# Patient Record
Sex: Female | Born: 1960 | Race: Black or African American | Hispanic: No | Marital: Single | Smoking: Former smoker
Health system: Southern US, Community
[De-identification: ages and names within clinical notes are randomized; demographics above are authoritative.]

## PROBLEM LIST (undated history)

## (undated) DIAGNOSIS — I10 Essential (primary) hypertension: Secondary | ICD-10-CM

## (undated) DIAGNOSIS — J449 Chronic obstructive pulmonary disease, unspecified: Secondary | ICD-10-CM

## (undated) DIAGNOSIS — I509 Heart failure, unspecified: Secondary | ICD-10-CM

## (undated) DIAGNOSIS — C801 Malignant (primary) neoplasm, unspecified: Secondary | ICD-10-CM

## (undated) DIAGNOSIS — J189 Pneumonia, unspecified organism: Secondary | ICD-10-CM

---

## 1990-04-18 HISTORY — PX: TUBAL LIGATION: SHX77

## 2016-12-22 DIAGNOSIS — M545 Low back pain: Secondary | ICD-10-CM | POA: Diagnosis not present

## 2016-12-22 DIAGNOSIS — G629 Polyneuropathy, unspecified: Secondary | ICD-10-CM | POA: Diagnosis not present

## 2016-12-22 DIAGNOSIS — I10 Essential (primary) hypertension: Secondary | ICD-10-CM | POA: Diagnosis not present

## 2016-12-22 DIAGNOSIS — M2550 Pain in unspecified joint: Secondary | ICD-10-CM | POA: Diagnosis not present

## 2016-12-27 DIAGNOSIS — M545 Low back pain: Secondary | ICD-10-CM | POA: Diagnosis not present

## 2016-12-27 DIAGNOSIS — M1711 Unilateral primary osteoarthritis, right knee: Secondary | ICD-10-CM | POA: Diagnosis not present

## 2016-12-27 DIAGNOSIS — M1712 Unilateral primary osteoarthritis, left knee: Secondary | ICD-10-CM | POA: Diagnosis not present

## 2016-12-27 DIAGNOSIS — M25561 Pain in right knee: Secondary | ICD-10-CM | POA: Diagnosis not present

## 2016-12-27 DIAGNOSIS — M25562 Pain in left knee: Secondary | ICD-10-CM | POA: Diagnosis not present

## 2017-01-04 DIAGNOSIS — M25569 Pain in unspecified knee: Secondary | ICD-10-CM | POA: Diagnosis not present

## 2017-01-04 DIAGNOSIS — M545 Low back pain: Secondary | ICD-10-CM | POA: Diagnosis not present

## 2017-01-06 DIAGNOSIS — M179 Osteoarthritis of knee, unspecified: Secondary | ICD-10-CM | POA: Diagnosis not present

## 2017-01-06 DIAGNOSIS — M2550 Pain in unspecified joint: Secondary | ICD-10-CM | POA: Diagnosis not present

## 2017-01-06 DIAGNOSIS — M545 Low back pain: Secondary | ICD-10-CM | POA: Diagnosis not present

## 2017-01-06 DIAGNOSIS — I10 Essential (primary) hypertension: Secondary | ICD-10-CM | POA: Diagnosis not present

## 2017-01-13 DIAGNOSIS — M170 Bilateral primary osteoarthritis of knee: Secondary | ICD-10-CM | POA: Diagnosis not present

## 2018-04-15 ENCOUNTER — Inpatient Hospital Stay (HOSPITAL_COMMUNITY)
Admission: EM | Admit: 2018-04-15 | Discharge: 2018-04-19 | DRG: 194 | Disposition: A | Discharge status: Home / Self Care | Payer: BLUE CROSS/BLUE SHIELD | Attending: Family Medicine | Admitting: Family Medicine

## 2018-04-15 ENCOUNTER — Emergency Department (HOSPITAL_COMMUNITY): Payer: BLUE CROSS/BLUE SHIELD

## 2018-04-15 ENCOUNTER — Encounter (HOSPITAL_COMMUNITY): Payer: Self-pay

## 2018-04-15 ENCOUNTER — Other Ambulatory Visit: Payer: Self-pay

## 2018-04-15 DIAGNOSIS — Z79899 Other long term (current) drug therapy: Secondary | ICD-10-CM | POA: Diagnosis not present

## 2018-04-15 DIAGNOSIS — I110 Hypertensive heart disease with heart failure: Secondary | ICD-10-CM | POA: Diagnosis not present

## 2018-04-15 DIAGNOSIS — Z833 Family history of diabetes mellitus: Secondary | ICD-10-CM | POA: Diagnosis not present

## 2018-04-15 DIAGNOSIS — J189 Pneumonia, unspecified organism: Secondary | ICD-10-CM | POA: Diagnosis present

## 2018-04-15 DIAGNOSIS — J440 Chronic obstructive pulmonary disease with acute lower respiratory infection: Secondary | ICD-10-CM | POA: Diagnosis present

## 2018-04-15 DIAGNOSIS — J9611 Chronic respiratory failure with hypoxia: Secondary | ICD-10-CM

## 2018-04-15 DIAGNOSIS — I5032 Chronic diastolic (congestive) heart failure: Secondary | ICD-10-CM | POA: Diagnosis present

## 2018-04-15 DIAGNOSIS — R05 Cough: Secondary | ICD-10-CM | POA: Diagnosis not present

## 2018-04-15 DIAGNOSIS — J209 Acute bronchitis, unspecified: Secondary | ICD-10-CM | POA: Diagnosis present

## 2018-04-15 DIAGNOSIS — R32 Unspecified urinary incontinence: Secondary | ICD-10-CM | POA: Diagnosis present

## 2018-04-15 DIAGNOSIS — J180 Bronchopneumonia, unspecified organism: Principal | ICD-10-CM | POA: Diagnosis present

## 2018-04-15 DIAGNOSIS — R0902 Hypoxemia: Secondary | ICD-10-CM | POA: Diagnosis present

## 2018-04-15 DIAGNOSIS — E860 Dehydration: Secondary | ICD-10-CM | POA: Diagnosis present

## 2018-04-15 DIAGNOSIS — Z809 Family history of malignant neoplasm, unspecified: Secondary | ICD-10-CM | POA: Diagnosis not present

## 2018-04-15 DIAGNOSIS — R0602 Shortness of breath: Secondary | ICD-10-CM | POA: Diagnosis not present

## 2018-04-15 DIAGNOSIS — Z87891 Personal history of nicotine dependence: Secondary | ICD-10-CM | POA: Diagnosis not present

## 2018-04-15 DIAGNOSIS — J441 Chronic obstructive pulmonary disease with (acute) exacerbation: Secondary | ICD-10-CM | POA: Diagnosis present

## 2018-04-15 DIAGNOSIS — R350 Frequency of micturition: Secondary | ICD-10-CM | POA: Diagnosis not present

## 2018-04-15 HISTORY — DX: Essential (primary) hypertension: I10

## 2018-04-15 HISTORY — DX: Heart failure, unspecified: I50.9

## 2018-04-15 HISTORY — DX: Pneumonia, unspecified organism: J18.9

## 2018-04-15 LAB — BRAIN NATRIURETIC PEPTIDE: B NATRIURETIC PEPTIDE 5: 29.6 pg/mL (ref 0.0–100.0)

## 2018-04-15 LAB — CBC WITH DIFFERENTIAL/PLATELET
Abs Immature Granulocytes: 0.07 10*3/uL (ref 0.00–0.07)
Basophils Absolute: 0 10*3/uL (ref 0.0–0.1)
Basophils Relative: 0 %
Eosinophils Absolute: 0.2 10*3/uL (ref 0.0–0.5)
Eosinophils Relative: 3 %
HCT: 48.8 % — ABNORMAL HIGH (ref 36.0–46.0)
Hemoglobin: 15.3 g/dL — ABNORMAL HIGH (ref 12.0–15.0)
Immature Granulocytes: 1 %
Lymphocytes Relative: 24 %
Lymphs Abs: 1.9 10*3/uL (ref 0.7–4.0)
MCH: 29.4 pg (ref 26.0–34.0)
MCHC: 31.4 g/dL (ref 30.0–36.0)
MCV: 93.7 fL (ref 80.0–100.0)
Monocytes Absolute: 0.6 10*3/uL (ref 0.1–1.0)
Monocytes Relative: 8 %
Neutro Abs: 5.2 10*3/uL (ref 1.7–7.7)
Neutrophils Relative %: 64 %
Platelets: 367 10*3/uL (ref 150–400)
RBC: 5.21 MIL/uL — ABNORMAL HIGH (ref 3.87–5.11)
RDW: 13.5 % (ref 11.5–15.5)
WBC: 8.1 10*3/uL (ref 4.0–10.5)
nRBC: 0.2 % (ref 0.0–0.2)

## 2018-04-15 LAB — BASIC METABOLIC PANEL
Anion gap: 10 (ref 5–15)
BUN: 9 mg/dL (ref 6–20)
CO2: 31 mmol/L (ref 22–32)
Calcium: 9.2 mg/dL (ref 8.9–10.3)
Chloride: 101 mmol/L (ref 98–111)
Creatinine, Ser: 1.06 mg/dL — ABNORMAL HIGH (ref 0.44–1.00)
GFR calc Af Amer: >60 mL/min (ref >60)
GFR calc non Af Amer: 58 mL/min — ABNORMAL LOW (ref >60)
Glucose, Bld: 114 mg/dL — ABNORMAL HIGH (ref 70–99)
POTASSIUM: 4.1 mmol/L (ref 3.5–5.1)
SODIUM: 142 mmol/L (ref 135–145)

## 2018-04-15 LAB — URINALYSIS, ROUTINE W REFLEX MICROSCOPIC
Glucose, UA: NEGATIVE mg/dL
Hgb urine dipstick: NEGATIVE
Ketones, ur: NEGATIVE mg/dL
Nitrite: NEGATIVE
Protein, ur: 100 mg/dL — AB
Specific Gravity, Urine: 1.023 (ref 1.005–1.030)
pH: 5 (ref 5.0–8.0)

## 2018-04-15 LAB — I-STAT TROPONIN, ED: Troponin i, poc: 0.01 ng/mL (ref 0.00–0.08)

## 2018-04-15 LAB — D-DIMER, QUANTITATIVE: D-Dimer, Quant: 0.68 ug/mL-FEU — ABNORMAL HIGH (ref 0.00–0.50)

## 2018-04-15 MED ORDER — SODIUM CHLORIDE 0.9 % IV SOLN
500.0000 mg | Freq: Once | INTRAVENOUS | Status: AC
  Administered 2018-04-15: 500 mg via INTRAVENOUS
  Filled 2018-04-15: qty 500

## 2018-04-15 MED ORDER — IOPAMIDOL (ISOVUE-370) INJECTION 76%
INTRAVENOUS | Status: AC
  Filled 2018-04-15: qty 100

## 2018-04-15 MED ORDER — ENOXAPARIN SODIUM 40 MG/0.4ML ~~LOC~~ SOLN
40.0000 mg | SUBCUTANEOUS | Status: DC
  Administered 2018-04-16 – 2018-04-18 (×3): 40 mg via SUBCUTANEOUS
  Filled 2018-04-15 (×3): qty 0.4

## 2018-04-15 MED ORDER — IOPAMIDOL (ISOVUE-370) INJECTION 76%
100.0000 mL | Freq: Once | INTRAVENOUS | Status: AC | PRN
  Administered 2018-04-15: 100 mL via INTRAVENOUS

## 2018-04-15 MED ORDER — SODIUM CHLORIDE 0.9 % IV SOLN
INTRAVENOUS | Status: AC
  Administered 2018-04-15: 21:00:00 via INTRAVENOUS

## 2018-04-15 MED ORDER — SODIUM CHLORIDE 0.9 % IV SOLN
1.0000 g | Freq: Once | INTRAVENOUS | Status: AC
  Administered 2018-04-15: 1 g via INTRAVENOUS
  Filled 2018-04-15: qty 10

## 2018-04-15 NOTE — ED Notes (Signed)
Admitting doctor at  The bedside 

## 2018-04-15 NOTE — ED Notes (Signed)
Report attempted

## 2018-04-15 NOTE — ED Notes (Signed)
Patient transported to CT

## 2018-04-15 NOTE — ED Provider Notes (Signed)
[facility] EMERGENCY DEPARTMENT Provider Note   CSN: [id number] Arrival date & time: 04/15/18  1135     History   Chief Complaint Chief Complaint  Patient presents with   Cough   Urinary Frequency   Fatigue    HPI Maleeah Crossman is a 57 y.o. female with a past medical history of hypertension who presents to ED for multiple complaints.  Her first complaint is 40-month history of frequent urination.  Her next complaint is cough for 1 week.  States that it is a dry cough and sometimes she will have increased urinary pressure and incontinence when she coughs.  She has tried Robitussin other over-the-counter medications with no improvement in her cough.  Denies any dysuria, abdominal pain, changes to bowel movements or fever, chest pain. Her next complaint is fatigue.  Daughter at bedside states that patient will "sleep a lot, all the time."  She does endorse shortness of breath worse with exertion.  Denies any recent immobilization, leg swelling or history of PE, DVT or CHF.  Denies any specific weakness, injuries or falls, headache or vision changes.  HPI  Past Medical History:  Diagnosis Date   Hypertension     There are no active problems to display for this patient.   Past Surgical History:  Procedure Laterality Date   CESAREAN SECTION       OB History   No obstetric history on file.      Home Medications    Prior to Admission medications   Medication Sig Start Date End Date Taking? Authorizing Provider  guaiFENesin (ROBITUSSIN) 100 MG/5ML SOLN Take 15 mLs by mouth every 4 (four) hours as needed for cough or to loosen phlegm.   Yes [provider]  sodium-potassium bicarbonate (ALKA-SELTZER GOLD) TBEF dissolvable tablet Take 1 tablet by mouth daily as needed (cold symptoms).   Yes [provider]    Family History History reviewed. No pertinent family history.  Social History Social History   Tobacco Use   Smoking  status: Never Smoker   Smokeless tobacco: Never Used  Substance Use Topics   Alcohol use: Never    Frequency: Never   Drug use: Never     Allergies   Patient has no known allergies.   Review of Systems Review of Systems  Constitutional: Positive for fatigue. Negative for appetite change, chills and fever.  HENT: Negative for ear pain, rhinorrhea, sneezing and sore throat.   Eyes: Negative for photophobia and visual disturbance.  Respiratory: Positive for cough and shortness of breath. Negative for chest tightness and wheezing.   Cardiovascular: Negative for chest pain and palpitations.  Gastrointestinal: Negative for abdominal pain, blood in stool, constipation, diarrhea, nausea and vomiting.  Genitourinary: Positive for frequency. Negative for dysuria, hematuria and urgency.  Musculoskeletal: Negative for myalgias.  Skin: Negative for rash.  Neurological: Negative for dizziness, weakness and light-headedness.     Physical Exam Updated Vital Signs BP 122/71    Pulse 90    Temp 99 F (37.2 C) (Oral)    Resp (!) 24    SpO2 90%   Physical Exam Vitals signs and nursing note reviewed.  Constitutional:      General: She is not in acute distress.    Appearance: She is well-developed.  HENT:     Head: Normocephalic and atraumatic.     Nose: Nose normal.  Eyes:     General: No scleral icterus.       Right eye: No discharge.  Left eye: No discharge.     Conjunctiva/sclera: Conjunctivae normal.  Neck:     Musculoskeletal: Normal range of motion and neck supple.  Cardiovascular:     Rate and Rhythm: Normal rate and regular rhythm.     Heart sounds: Normal heart sounds. No murmur. No friction rub. No gallop.   Pulmonary:     Effort: Pulmonary effort is normal. No respiratory distress.     Breath sounds: Normal breath sounds.  Abdominal:     General: Bowel sounds are normal. There is no distension.     Palpations: Abdomen is soft.     Tenderness: There is no  abdominal tenderness. There is no guarding.  Musculoskeletal: Normal range of motion.     Comments: No midline spinal tenderness present in lumbar, thoracic or cervical spine. No step-off palpated. No visible bruising, edema or temperature change noted. No objective signs of numbness present. No saddle anesthesia. 2+ DP pulses bilaterally. Sensation intact to light touch. Strength 5/5 in bilateral lower extremities. No calf tenderness bilaterally.  Skin:    General: Skin is warm and dry.     Findings: No rash.  Neurological:     Mental Status: She is alert.     Motor: No abnormal muscle tone.     Coordination: Coordination normal.      ED Treatments / Results  Labs (all labs ordered are listed, but only abnormal results are displayed) Labs Reviewed  URINALYSIS, ROUTINE W REFLEX MICROSCOPIC - Abnormal; Notable for the following components:      Result Value   Color, Urine AMBER (*)    APPearance CLOUDY (*)    Bilirubin Urine SMALL (*)    Protein, ur 100 (*)    Leukocytes, UA SMALL (*)    Bacteria, UA RARE (*)    All other components within normal limits  BASIC METABOLIC PANEL - Abnormal; Notable for the following components:   Glucose, Bld 114 (*)    Creatinine, Ser 1.06 (*)    GFR calc non Af Amer 58 (*)    All other components within normal limits  CBC WITH DIFFERENTIAL/PLATELET - Abnormal; Notable for the following components:   RBC 5.21 (*)    Hemoglobin 15.3 (*)    HCT 48.8 (*)    All other components within normal limits  D-DIMER, QUANTITATIVE (NOT AT [facility]) - Abnormal; Notable for the following components:   D-Dimer, Quant 0.68 (*)    All other components within normal limits  BRAIN NATRIURETIC PEPTIDE  I-STAT TROPONIN, ED    EKG EKG Interpretation  Date/Time:  Sunday April 15 2018 13:53:53 EST Ventricular Rate:  87 PR Interval:  138 QRS Duration: 84 QT Interval:  398 QTC Calculation: 478 R Axis:   67 Text Interpretation:  Normal sinus rhythm Normal ECG  No previous tracing Confirmed by Blanchie Dessert [phone]) on 04/15/2018 1:57:11 PM   Radiology Dg Chest 2 View  Result Date: 04/15/2018 CLINICAL DATA:  Cough for 1 week.  Frequent urination. EXAM: CHEST - 2 VIEW COMPARISON:  None. FINDINGS: Vague densities in the left lower chest which could be related to overlying soft tissues. Upper lungs are clear. Heart size is within normal limits. No large pleural effusions. Bone structures are unremarkable. IMPRESSION: Subtle densities in the left lower chest are nonspecific. Findings could be related overlying soft tissues versus atelectasis. Limited evaluation of the lungs due to body habitus. Electronically Signed   By: Markus Daft M.D.   On: 04/15/2018 14:16    Procedures  Procedures (including critical care time)  Medications Ordered in ED Medications  iopamidol (ISOVUE-370) 76 % injection (has no administration in time range)  iopamidol (ISOVUE-370) 76 % injection 100 mL (100 mLs Intravenous Contrast Given 04/15/18 1531)     Initial Impression / Assessment and Plan / ED Course  I have reviewed the triage vital signs and the nursing notes.  Pertinent labs & imaging results that were available during my care of the patient were reviewed by me and considered in my medical decision making (see chart for details).     57 year old female with a past medical history of hypertension presents to ED for 39-month history of frequent urination, shortness of breath, fatigue and 1 week history of cough.  Daughter at bedside states that patient has low energy and "sleeping a lot."  She reports shortness of breath with exertion.  Denies any chest pain, injuries or falls, wheezing or hemoptysis.  Patient initially 92% on room air.  She then dropped to 80s on room air.  She is afebrile.  No leg edema, erythema or calf tenderness that would concern me for DVT.  CBC, BMP unremarkable.  Urinalysis is negative for bacteria.  Chest x-ray shows nonspecific densities in  left lower chest.  EKG shows normal sinus rhythm.  Troponin, d-dimer and BNP pending.  Patient placed on 3 L of oxygen by nasal cannula.  Troponin is negative.  D-dimer elevated.  Will need CTA of the chest to rule out PE. Care handed off to oncoming provider.   Final Clinical Impressions(s) / ED Diagnoses   Final diagnoses:  Hypoxia    ED Discharge Orders    None       Delia Heady, PA-C 04/15/18 1546    Blanchie Dessert, MD 04/15/18 1727

## 2018-04-15 NOTE — ED Notes (Signed)
Pt returned from  c-t  sats good she remains on 02  No discomfort

## 2018-04-15 NOTE — ED Notes (Signed)
Report attempted  unsuccessdul  They will call me

## 2018-04-15 NOTE — ED Triage Notes (Signed)
Pt c/o frequent urination, dry cough and fatigue x6 months.

## 2018-04-15 NOTE — Progress Notes (Signed)
Patient arrived to floor. Alert and oriented x4. Daughter at bedside.

## 2018-04-15 NOTE — H&P (Signed)
History and Physical    Tiffany Owen JJO:[id number] DOB: 05-31-1960 DOA: 04/15/2018  PCP: System, Pcp Not In  Patient cannot remember name, visiting from [facility] Patient coming from: daughter's home  Chief Complaint: SOB  HPI: Tiffany Owen is a 57 y.o. female with medical history significant of HTN who presents for 6 months of SOB and weight gain.  This was attributed to recently quitting smoking and being more sedentary.  However, over the last week, she has gotten much more short of breath, now with DOE, chills and a cough.  She has had a sore abdomen from coughing.  She has two sick grandchildren.  She denies productive cough, fever, chest pain, mylagia, orthopnea, pleuritic pain, diarrhea, nausea, vomiting, decreased PO intake, recent travel out of state.   ED Course: In the ED, she was found to have an elevated D dimer and a CT scan was done which showed bilateral multilobar bronchopneumonia. Labs further showed an increased H/H and Cr of 1.06.  We don't have old to review (most recent in care everywhere are from 2017. TnI was 0.01.   Review of Systems: As per HPI otherwise 10 point review of systems negative.    Past Medical History:  Diagnosis Date   Hypertension     Past Surgical History:  Procedure Laterality Date   CESAREAN SECTION       reports that she has quit smoking. She has never used smokeless tobacco. She reports that she does not drink alcohol or use drugs.  No Known Allergies  Family History  Problem Relation Age of Onset   Cancer Mother    Diabetes Mellitus II Father    Cancer Father    Cancer Brother      Prior to Admission medications   Medication Sig Start Date End Date Taking? Authorizing Provider  guaiFENesin (ROBITUSSIN) 100 MG/5ML SOLN Take 15 mLs by mouth every 4 (four) hours as needed for cough or to loosen phlegm.   Yes [provider]  sodium-potassium bicarbonate (ALKA-SELTZER GOLD) TBEF dissolvable tablet Take 1 tablet by  mouth daily as needed (cold symptoms).   Yes [provider]    Physical Exam:  Constitutional: Obese woman, comfortable in chair Vitals:   04/15/18 1610 04/15/18 1637 04/15/18 1706 04/15/18 1756  BP:  125/68 130/75 124/64  Pulse:  94 (!) 101 84  Resp:  16 20 20   Temp:   98.3 F (36.8 C)   TempSrc:      SpO2: (!) 87% 95% 94% 93%   Eyes:  lids and conjunctivae normal ENMT: Mucous membranes are dry. Normal dentition.  Neck: normal, supple Respiratory: Course breath sounds in bases, worse on the left.  She has a large body habitus, so breath sounds are distant Cardiovascular: RR, NR, no apparent murmur Abdomen: diffusely tender, obese, +BS  Musculoskeletal: Good muscle tone, no edema, warm and well perfused.  Skin: no rashes, lesions, ulcers. No induration Neurologic: Grossly intact, no sensory deficit to light touch.  Psychiatric: Normal judgment and insight. Alert and oriented x 3. Normal mood.   Labs on Admission: I have personally reviewed following labs and imaging studies  CBC: Recent Labs  Lab 04/15/18 1229  WBC 8.1  NEUTROABS 5.2  HGB 15.3*  HCT 48.8*  MCV 93.7  PLT 160   Basic Metabolic Panel: Recent Labs  Lab 04/15/18 1229  NA 142  K 4.1  CL 101  CO2 31  GLUCOSE 114*  BUN 9  CREATININE 1.06*  CALCIUM 9.2  GFR: CrCl cannot be calculated (Unknown ideal weight.). Liver Function Tests: No results for input(s): AST, ALT, ALKPHOS, BILITOT, PROT, ALBUMIN in the last 168 hours. No results for input(s): LIPASE, AMYLASE in the last 168 hours. No results for input(s): AMMONIA in the last 168 hours. Coagulation Profile: No results for input(s): INR, PROTIME in the last 168 hours. Cardiac Enzymes: No results for input(s): CKTOTAL, CKMB, CKMBINDEX, TROPONINI in the last 168 hours. BNP (last 3 results) No results for input(s): PROBNP in the last 8760 hours. HbA1C: No results for input(s): HGBA1C in the last 72 hours. CBG: No results for input(s):  GLUCAP in the last 168 hours. Lipid Profile: No results for input(s): CHOL, HDL, LDLCALC, TRIG, CHOLHDL, LDLDIRECT in the last 72 hours. Thyroid Function Tests: No results for input(s): TSH, T4TOTAL, FREET4, T3FREE, THYROIDAB in the last 72 hours. Anemia Panel: No results for input(s): VITAMINB12, FOLATE, FERRITIN, TIBC, IRON, RETICCTPCT in the last 72 hours. Urine analysis:    Component Value Date/Time   COLORURINE AMBER (A) 04/15/2018 1241   APPEARANCEUR CLOUDY (A) 04/15/2018 1241   LABSPEC 1.023 04/15/2018 1241   PHURINE 5.0 04/15/2018 1241   GLUCOSEU NEGATIVE 04/15/2018 1241   HGBUR NEGATIVE 04/15/2018 1241   BILIRUBINUR SMALL (A) 04/15/2018 1241   KETONESUR NEGATIVE 04/15/2018 1241   PROTEINUR 100 (A) 04/15/2018 1241   NITRITE NEGATIVE 04/15/2018 1241   LEUKOCYTESUR SMALL (A) 04/15/2018 1241    Radiological Exams on Admission: Dg Chest 2 View  Result Date: 04/15/2018 CLINICAL DATA:  Cough for 1 week.  Frequent urination. EXAM: CHEST - 2 VIEW COMPARISON:  None. FINDINGS: Vague densities in the left lower chest which could be related to overlying soft tissues. Upper lungs are clear. Heart size is within normal limits. No large pleural effusions. Bone structures are unremarkable. IMPRESSION: Subtle densities in the left lower chest are nonspecific. Findings could be related overlying soft tissues versus atelectasis. Limited evaluation of the lungs due to body habitus. Electronically Signed   By: Markus Daft M.D.   On: 04/15/2018 14:16   Ct Angio Chest Pe W/cm &/or Wo Cm  Result Date: 04/15/2018 CLINICAL DATA:  57 year old female with history of cough and shortness of breath for the past 2 weeks. No associated chest pain. EXAM: CT ANGIOGRAPHY CHEST WITH CONTRAST TECHNIQUE: Multidetector CT imaging of the chest was performed using the standard protocol during bolus administration of intravenous contrast. Multiplanar CT image reconstructions and MIPs were obtained to evaluate the  vascular anatomy. CONTRAST:  <See Chart> ISOVUE-370 IOPAMIDOL (ISOVUE-370) INJECTION 76% COMPARISON:  No priors. FINDINGS: Cardiovascular: No filling defects within the pulmonary arterial tree to suggest underlying pulmonary embolism. Heart size is mildly enlarged. There is no significant pericardial fluid, thickening or pericardial calcification. Aortic atherosclerosis. No definite coronary artery calcifications. Mediastinum/Nodes: No pathologically enlarged mediastinal or hilar lymph nodes. Esophagus is unremarkable in appearance. No axillary lymphadenopathy. Lungs/Pleura: Patchy multifocal interstitial and airspace disease in the lungs bilaterally, most confluent in the left lower lobe where there is extensive peribronchovascular ground-glass attenuation and areas of consolidation, most compatible with multilobar bronchopneumonia. Area of scarring in the inferior segment of the lingula. No pleural effusions. Upper Abdomen: Diffuse low attenuation throughout the visualized hepatic parenchyma, indicative of severe hepatic steatosis. Musculoskeletal: There are no aggressive appearing lytic or blastic lesions noted in the visualized portions of the skeleton. Review of the MIP images confirms the above findings. IMPRESSION: 1. No evidence of pulmonary embolism. 2. Multilobar bronchopneumonia, most severe in the left lower lobe. 3.  Mild cardiomegaly. 4. Hepatic steatosis. 5. Aortic atherosclerosis. Aortic Atherosclerosis (ICD10-I70.0). Electronically Signed   By: Vinnie Langton M.D.   On: 04/15/2018 16:10    EKG: Independently reviewed. NSR, no ST or TW changes  Assessment/Plan Pneumonia - She likely has an atypical pneumonia given how extensive and how mildly ill she appears on exam - Azithromycin and Rocephin, monitor for improvement - Oxygen to keep saturation > 90% - Urine strep pneumo antigen - Incentive spirometer - Sputum culture - HIV  Mild dehydration - IVF with NS at 100cc/hr for 10 hours -  Recheck labs in the AM  H/O HTN - Monitor vitals, consider starting medication if needed   DVT prophylaxis: Lovenox Code Status: Full Disposition Plan: Obs, see if she still needs O2 in the AM Consults called: None Admission status: Obs, med surg    Gilles Chiquito MD [facility] Pager [phone]  If 7PM-7AM, please contact night-coverage [email or web address] Password [facility]  04/15/2018, 7:58 PM

## 2018-04-15 NOTE — ED Notes (Signed)
Pts oxygen sat on room air 82%. Pt put on 3L of oxygen and oxygen went up to 93%.

## 2018-04-16 ENCOUNTER — Ambulatory Visit (HOSPITAL_BASED_OUTPATIENT_CLINIC_OR_DEPARTMENT_OTHER): Payer: BLUE CROSS/BLUE SHIELD

## 2018-04-16 DIAGNOSIS — R0602 Shortness of breath: Secondary | ICD-10-CM | POA: Diagnosis not present

## 2018-04-16 DIAGNOSIS — R0902 Hypoxemia: Secondary | ICD-10-CM | POA: Diagnosis not present

## 2018-04-16 DIAGNOSIS — J189 Pneumonia, unspecified organism: Secondary | ICD-10-CM | POA: Diagnosis not present

## 2018-04-16 LAB — CBC
HCT: 46.9 % — ABNORMAL HIGH (ref 36.0–46.0)
Hemoglobin: 14 g/dL (ref 12.0–15.0)
MCH: 28.4 pg (ref 26.0–34.0)
MCHC: 29.9 g/dL — ABNORMAL LOW (ref 30.0–36.0)
MCV: 95.1 fL (ref 80.0–100.0)
Platelets: 329 10*3/uL (ref 150–400)
RBC: 4.93 MIL/uL (ref 3.87–5.11)
RDW: 13.7 % (ref 11.5–15.5)
WBC: 7.5 10*3/uL (ref 4.0–10.5)
nRBC: 0.3 % — ABNORMAL HIGH (ref 0.0–0.2)

## 2018-04-16 LAB — PROCALCITONIN: Procalcitonin: <0.1 ng/mL

## 2018-04-16 LAB — BASIC METABOLIC PANEL
Anion gap: 9 (ref 5–15)
BUN: 12 mg/dL (ref 6–20)
CO2: 31 mmol/L (ref 22–32)
Calcium: 8.8 mg/dL — ABNORMAL LOW (ref 8.9–10.3)
Chloride: 103 mmol/L (ref 98–111)
Creatinine, Ser: 1.07 mg/dL — ABNORMAL HIGH (ref 0.44–1.00)
GFR calc Af Amer: >60 mL/min (ref >60)
GFR calc non Af Amer: 57 mL/min — ABNORMAL LOW (ref >60)
Glucose, Bld: 109 mg/dL — ABNORMAL HIGH (ref 70–99)
Potassium: 4 mmol/L (ref 3.5–5.1)
Sodium: 143 mmol/L (ref 135–145)

## 2018-04-16 LAB — ECHOCARDIOGRAM COMPLETE

## 2018-04-16 LAB — HIV ANTIBODY (ROUTINE TESTING W REFLEX): HIV Screen 4th Generation wRfx: NONREACTIVE

## 2018-04-16 LAB — STREP PNEUMONIAE URINARY ANTIGEN: Strep Pneumo Urinary Antigen: NEGATIVE

## 2018-04-16 MED ORDER — ACETAMINOPHEN 325 MG PO TABS
650.0000 mg | ORAL_TABLET | Freq: Four times a day (QID) | ORAL | Status: DC | PRN
  Administered 2018-04-16 – 2018-04-17 (×2): 650 mg via ORAL
  Filled 2018-04-16 (×2): qty 2

## 2018-04-16 MED ORDER — HYDROCODONE-ACETAMINOPHEN 5-325 MG PO TABS
1.0000 | ORAL_TABLET | Freq: Four times a day (QID) | ORAL | Status: DC | PRN
  Administered 2018-04-17: 1 via ORAL
  Filled 2018-04-16: qty 1

## 2018-04-16 MED ORDER — FUROSEMIDE 10 MG/ML IJ SOLN
40.0000 mg | Freq: Once | INTRAMUSCULAR | Status: AC
  Administered 2018-04-16: 40 mg via INTRAVENOUS
  Filled 2018-04-16: qty 4

## 2018-04-16 MED ORDER — SODIUM CHLORIDE 0.9 % IV SOLN
1.0000 g | INTRAVENOUS | Status: DC
  Administered 2018-04-16 – 2018-04-18 (×3): 1 g via INTRAVENOUS
  Filled 2018-04-16 (×4): qty 10

## 2018-04-16 MED ORDER — SODIUM CHLORIDE 0.9 % IV SOLN
500.0000 mg | INTRAVENOUS | Status: DC
  Administered 2018-04-16 – 2018-04-18 (×3): 500 mg via INTRAVENOUS
  Filled 2018-04-16 (×4): qty 500

## 2018-04-16 NOTE — Progress Notes (Signed)
[facility]  PROGRESS NOTE  Tiffany Owen HDQ:[id number] DOB: Jan 06, 1961 DOA: 04/15/2018 PCP: System, Pcp Not In   Brief HPI:   57 year old female with a history of hypertension, 6 months history of shortness of breath and weight gain.  Patient in the ED was found to have multilobar bronchopneumonia.  Started on IV antibiotics.    Subjective   Patient is breathing better.  Denies coughing up any phlegm.  No chest pain.   Assessment/Plan:     1. Community-acquired pneumonia-likely has atypical pneumonia started on Zithromax and Rocephin.  Urinary strep pneumo antigen is negative.  Procalcitonin obtained today is less than 0.10.  2. Hypertension-patient stopped taking her medications for hypertension in September.  3. Chronic diastolic CHF-echocardiogram obtained today shows grade 1 diastolic dysfunction, EF 65 to 70%.  Pulmonary artery pressure within normal limits.  1 dose of Lasix 40 mg IV given.  Will follow BMP in a.m.     CBG: No results for input(s): GLUCAP in the last 168 hours.  CBC: Recent Labs  Lab 04/15/18 1229 04/16/18 0117  WBC 8.1 7.5  NEUTROABS 5.2  --   HGB 15.3* 14.0  HCT 48.8* 46.9*  MCV 93.7 95.1  PLT 367 119    Basic Metabolic Panel: Recent Labs  Lab 04/15/18 1229 04/16/18 0117  NA 142 143  K 4.1 4.0  CL 101 103  CO2 31 31  GLUCOSE 114* 109*  BUN 9 12  CREATININE 1.06* 1.07*  CALCIUM 9.2 8.8*     DVT prophylaxis: Lovenox  Code Status: Full code  Family Communication: Discussed with daughter, sisters at bedside.  Disposition Plan: likely home when medically ready for discharge   Consultants:    Procedures:     Antibiotics:   Anti-infectives (From admission, onward)   Start     Dose/Rate Route Frequency Ordered Stop   04/16/18 1700  cefTRIAXone (ROCEPHIN) 1 g in sodium chloride 0.9 % 100 mL IVPB     1 g 200 mL/hr over 30 Minutes Intravenous Every 24 hours 04/16/18 1221 04/22/18 1659   04/16/18 1700  azithromycin  (ZITHROMAX) 500 mg in sodium chloride 0.9 % 250 mL IVPB     500 mg 250 mL/hr over 60 Minutes Intravenous Every 24 hours 04/16/18 1221 04/20/18 1659   04/15/18 1645  cefTRIAXone (ROCEPHIN) 1 g in sodium chloride 0.9 % 100 mL IVPB     1 g 200 mL/hr over 30 Minutes Intravenous  Once 04/15/18 1639 04/15/18 1750   04/15/18 1645  azithromycin (ZITHROMAX) 500 mg in sodium chloride 0.9 % 250 mL IVPB     500 mg 250 mL/hr over 60 Minutes Intravenous  Once 04/15/18 1639 04/15/18 1853       Objective   Vitals:   04/15/18 2006 04/15/18 2149 04/16/18 0155 04/16/18 0605  BP:  (!) 146/75 136/86 120/80  Pulse:  95 93 91  Resp:  20 19 20   Temp:  98.8 F (37.1 C) 97.8 F (36.6 C) 98.4 F (36.9 C)  TempSrc:  Oral Oral Oral  SpO2: 93% 99% 96% 91%    Intake/Output Summary (Last 24 hours) at 04/16/2018 1511 Last data filed at 04/16/2018 0900 Gross per 24 hour  Intake 1128.04 ml  Output --  Net 1128.04 ml   There were no vitals filed for this visit.   Physical Examination:    General: Appears in no acute distress.  Cardiovascular: S1-S2, regular, no murmur auscultated  Respiratory: Decreased breath sounds bilaterally  Abdomen: Soft, nontender, no organomegaly  Extremities: No edema in the lower extremities  Neurologic: Alert, oriented x3, no focal deficit noted     Data Reviewed: I have personally reviewed following labs and imaging studies   No results found for this or any previous visit (from the past 240 hour(s)).   Liver Function Tests: No results for input(s): AST, ALT, ALKPHOS, BILITOT, PROT, ALBUMIN in the last 168 hours. No results for input(s): LIPASE, AMYLASE in the last 168 hours. No results for input(s): AMMONIA in the last 168 hours.  Cardiac Enzymes: No results for input(s): CKTOTAL, CKMB, CKMBINDEX, TROPONINI in the last 168 hours. BNP (last 3 results) Recent Labs    04/15/18 1442  BNP 29.6    ProBNP (last 3 results) No results for input(s): PROBNP  in the last 8760 hours.    Studies: Dg Chest 2 View  Result Date: 04/15/2018 CLINICAL DATA:  Cough for 1 week.  Frequent urination. EXAM: CHEST - 2 VIEW COMPARISON:  None. FINDINGS: Vague densities in the left lower chest which could be related to overlying soft tissues. Upper lungs are clear. Heart size is within normal limits. No large pleural effusions. Bone structures are unremarkable. IMPRESSION: Subtle densities in the left lower chest are nonspecific. Findings could be related overlying soft tissues versus atelectasis. Limited evaluation of the lungs due to body habitus. Electronically Signed   By: Markus Daft M.D.   On: 04/15/2018 14:16   Ct Angio Chest Pe W/cm &/or Wo Cm  Result Date: 04/15/2018 CLINICAL DATA:  57 year old female with history of cough and shortness of breath for the past 2 weeks. No associated chest pain. EXAM: CT ANGIOGRAPHY CHEST WITH CONTRAST TECHNIQUE: Multidetector CT imaging of the chest was performed using the standard protocol during bolus administration of intravenous contrast. Multiplanar CT image reconstructions and MIPs were obtained to evaluate the vascular anatomy. CONTRAST:  <See Chart> ISOVUE-370 IOPAMIDOL (ISOVUE-370) INJECTION 76% COMPARISON:  No priors. FINDINGS: Cardiovascular: No filling defects within the pulmonary arterial tree to suggest underlying pulmonary embolism. Heart size is mildly enlarged. There is no significant pericardial fluid, thickening or pericardial calcification. Aortic atherosclerosis. No definite coronary artery calcifications. Mediastinum/Nodes: No pathologically enlarged mediastinal or hilar lymph nodes. Esophagus is unremarkable in appearance. No axillary lymphadenopathy. Lungs/Pleura: Patchy multifocal interstitial and airspace disease in the lungs bilaterally, most confluent in the left lower lobe where there is extensive peribronchovascular ground-glass attenuation and areas of consolidation, most compatible with multilobar  bronchopneumonia. Area of scarring in the inferior segment of the lingula. No pleural effusions. Upper Abdomen: Diffuse low attenuation throughout the visualized hepatic parenchyma, indicative of severe hepatic steatosis. Musculoskeletal: There are no aggressive appearing lytic or blastic lesions noted in the visualized portions of the skeleton. Review of the MIP images confirms the above findings. IMPRESSION: 1. No evidence of pulmonary embolism. 2. Multilobar bronchopneumonia, most severe in the left lower lobe. 3. Mild cardiomegaly. 4. Hepatic steatosis. 5. Aortic atherosclerosis. Aortic Atherosclerosis (ICD10-I70.0). Electronically Signed   By: Vinnie Langton M.D.   On: 04/15/2018 16:10    Scheduled Meds:  enoxaparin (LOVENOX) injection  40 mg Subcutaneous Q24H    Admission status: Observation: Based on patients clinical presentation and evaluation of above clinical data, I have made determination that patient will need less than 2 midnights in the hospital.  Still requiring oxygen if oxygen can be weaned off.  Likely discharge in a.m.  Time spent: 20 min  [facility] Pager [phone]. If 7PM-7AM, please contact night-coverage at [email or web address], Office  [place]  04/16/2018, 3:11 PM  LOS: 0 days

## 2018-04-16 NOTE — Progress Notes (Signed)
°  Echocardiogram 2D Echocardiogram has been performed.  Marybelle Killings 04/16/2018, 12:23 PM

## 2018-04-17 ENCOUNTER — Other Ambulatory Visit: Payer: Self-pay

## 2018-04-17 ENCOUNTER — Encounter (HOSPITAL_COMMUNITY): Payer: Self-pay | Admitting: General Practice

## 2018-04-17 DIAGNOSIS — Z87891 Personal history of nicotine dependence: Secondary | ICD-10-CM | POA: Diagnosis not present

## 2018-04-17 DIAGNOSIS — R350 Frequency of micturition: Secondary | ICD-10-CM | POA: Diagnosis present

## 2018-04-17 DIAGNOSIS — Z833 Family history of diabetes mellitus: Secondary | ICD-10-CM | POA: Diagnosis not present

## 2018-04-17 DIAGNOSIS — J189 Pneumonia, unspecified organism: Secondary | ICD-10-CM | POA: Diagnosis not present

## 2018-04-17 DIAGNOSIS — J209 Acute bronchitis, unspecified: Secondary | ICD-10-CM | POA: Diagnosis not present

## 2018-04-17 DIAGNOSIS — J180 Bronchopneumonia, unspecified organism: Secondary | ICD-10-CM | POA: Diagnosis present

## 2018-04-17 DIAGNOSIS — I5032 Chronic diastolic (congestive) heart failure: Secondary | ICD-10-CM | POA: Diagnosis present

## 2018-04-17 DIAGNOSIS — J440 Chronic obstructive pulmonary disease with acute lower respiratory infection: Secondary | ICD-10-CM | POA: Diagnosis not present

## 2018-04-17 DIAGNOSIS — Z809 Family history of malignant neoplasm, unspecified: Secondary | ICD-10-CM | POA: Diagnosis not present

## 2018-04-17 DIAGNOSIS — J441 Chronic obstructive pulmonary disease with (acute) exacerbation: Secondary | ICD-10-CM | POA: Diagnosis present

## 2018-04-17 DIAGNOSIS — E860 Dehydration: Secondary | ICD-10-CM | POA: Diagnosis present

## 2018-04-17 DIAGNOSIS — R0902 Hypoxemia: Secondary | ICD-10-CM | POA: Diagnosis not present

## 2018-04-17 DIAGNOSIS — R32 Unspecified urinary incontinence: Secondary | ICD-10-CM | POA: Diagnosis present

## 2018-04-17 DIAGNOSIS — Z79899 Other long term (current) drug therapy: Secondary | ICD-10-CM | POA: Diagnosis not present

## 2018-04-17 DIAGNOSIS — I110 Hypertensive heart disease with heart failure: Secondary | ICD-10-CM | POA: Diagnosis present

## 2018-04-17 HISTORY — DX: Pneumonia, unspecified organism: J18.9

## 2018-04-17 LAB — CBC
HEMATOCRIT: 44.3 % (ref 36.0–46.0)
Hemoglobin: 13.4 g/dL (ref 12.0–15.0)
MCH: 28.9 pg (ref 26.0–34.0)
MCHC: 30.2 g/dL (ref 30.0–36.0)
MCV: 95.5 fL (ref 80.0–100.0)
Platelets: 333 10*3/uL (ref 150–400)
RBC: 4.64 MIL/uL (ref 3.87–5.11)
RDW: 13.7 % (ref 11.5–15.5)
WBC: 7.1 10*3/uL (ref 4.0–10.5)
nRBC: 0 % (ref 0.0–0.2)

## 2018-04-17 LAB — URINE CULTURE: Culture: <10000 — AB

## 2018-04-17 LAB — BASIC METABOLIC PANEL
Anion gap: 10 (ref 5–15)
BUN: 6 mg/dL (ref 6–20)
CO2: 29 mmol/L (ref 22–32)
Calcium: 8.6 mg/dL — ABNORMAL LOW (ref 8.9–10.3)
Chloride: 102 mmol/L (ref 98–111)
Creatinine, Ser: 0.86 mg/dL (ref 0.44–1.00)
GFR calc Af Amer: >60 mL/min (ref >60)
GFR calc non Af Amer: >60 mL/min (ref >60)
Glucose, Bld: 104 mg/dL — ABNORMAL HIGH (ref 70–99)
Potassium: 3.9 mmol/L (ref 3.5–5.1)
SODIUM: 141 mmol/L (ref 135–145)

## 2018-04-17 MED ORDER — IPRATROPIUM-ALBUTEROL 0.5-2.5 (3) MG/3ML IN SOLN: 3.0000 mL | Freq: Four times a day (QID) | RESPIRATORY_TRACT | Status: DC | PRN

## 2018-04-17 MED ORDER — GUAIFENESIN ER 600 MG PO TB12
600.0000 mg | ORAL_TABLET | Freq: Two times a day (BID) | ORAL | Status: DC
  Administered 2018-04-17 – 2018-04-19 (×5): 600 mg via ORAL
  Filled 2018-04-17 (×5): qty 1

## 2018-04-17 MED ORDER — METHYLPREDNISOLONE SODIUM SUCC 125 MG IJ SOLR
60.0000 mg | Freq: Four times a day (QID) | INTRAMUSCULAR | Status: DC
  Administered 2018-04-17 – 2018-04-19 (×9): 60 mg via INTRAVENOUS
  Filled 2018-04-17 (×9): qty 2

## 2018-04-17 MED ORDER — IPRATROPIUM-ALBUTEROL 0.5-2.5 (3) MG/3ML IN SOLN
3.0000 mL | Freq: Four times a day (QID) | RESPIRATORY_TRACT | Status: DC
  Administered 2018-04-17: 3 mL via RESPIRATORY_TRACT
  Filled 2018-04-17: qty 3

## 2018-04-17 NOTE — Plan of Care (Signed)
°  Problem: Clinical Measurements: °Goal: Respiratory complications will improve °Outcome: Progressing °  °Problem: Pain Managment: °Goal: General experience of comfort will improve °Outcome: Progressing °  °Problem: Safety: °Goal: Ability to remain free from injury will improve °Outcome: Progressing °  °

## 2018-04-17 NOTE — Progress Notes (Signed)
[facility]  PROGRESS NOTE  Tiffany Owen GDJ:[id number] DOB: 12-26-1960 DOA: 04/15/2018 PCP: System, Pcp Not In   Brief HPI:   57 year old female with a history of hypertension, 6 months history of shortness of breath and weight gain.  Patient in the ED was found to have multilobar bronchopneumonia.  Started on IV antibiotics.    Subjective   Patient seen and examined, still has dyspnea on exertion.  Did not diurese much with IV Lasix.   Assessment/Plan:     1. Community-acquired pneumonia-likely has atypical pneumonia started on Zithromax and Rocephin.  Urinary strep pneumo antigen is negative.  Procalcitonin obtained today is less than 0.10.  2. Acute bronchitis/COPD exacerbation-patient may have underlying COPD, today she has decreased breath sounds bilaterally with wheezing.  We will start her on duo nebs every 6 hours, Solu-Medrol 60 mg IV every 6 hours.  3. Hypertension-patient stopped taking her medications for hypertension in September.  4. Chronic diastolic CHF-echocardiogram obtained today shows grade 1 diastolic dysfunction, EF 65 to 70%.  Pulmonary artery pressure within normal limits.  1 dose of Lasix 40 mg IV given.  Not much output.  Will not repeat Lasix at this time.     CBG: No results for input(s): GLUCAP in the last 168 hours.  CBC: Recent Labs  Lab 04/15/18 1229 04/16/18 0117 04/17/18 0732  WBC 8.1 7.5 7.1  NEUTROABS 5.2  --   --   HGB 15.3* 14.0 13.4  HCT 48.8* 46.9* 44.3  MCV 93.7 95.1 95.5  PLT 367 329 622    Basic Metabolic Panel: Recent Labs  Lab 04/15/18 1229 04/16/18 0117 04/17/18 0732  NA 142 143 141  K 4.1 4.0 3.9  CL 101 103 102  CO2 31 31 29   GLUCOSE 114* 109* 104*  BUN 9 12 6   CREATININE 1.06* 1.07* 0.86  CALCIUM 9.2 8.8* 8.6*     DVT prophylaxis: Lovenox  Code Status: Full code  Family Communication: Discussed with daughter, sisters at bedside.  Disposition Plan: likely home when medically ready for  discharge   Consultants:    Procedures:     Antibiotics:   Anti-infectives (From admission, onward)   Start     Dose/Rate Route Frequency Ordered Stop   04/16/18 1700  cefTRIAXone (ROCEPHIN) 1 g in sodium chloride 0.9 % 100 mL IVPB     1 g 200 mL/hr over 30 Minutes Intravenous Every 24 hours 04/16/18 1221 04/22/18 1659   04/16/18 1700  azithromycin (ZITHROMAX) 500 mg in sodium chloride 0.9 % 250 mL IVPB     500 mg 250 mL/hr over 60 Minutes Intravenous Every 24 hours 04/16/18 1221 04/20/18 1659   04/15/18 1645  cefTRIAXone (ROCEPHIN) 1 g in sodium chloride 0.9 % 100 mL IVPB     1 g 200 mL/hr over 30 Minutes Intravenous  Once 04/15/18 1639 04/15/18 1750   04/15/18 1645  azithromycin (ZITHROMAX) 500 mg in sodium chloride 0.9 % 250 mL IVPB     500 mg 250 mL/hr over 60 Minutes Intravenous  Once 04/15/18 1639 04/15/18 1853       Objective   Vitals:   04/16/18 1957 04/17/18 0538 04/17/18 1355 04/17/18 1438  BP: 121/67 (!) 142/82 (!) 144/89   Pulse: 97 100 89   Resp: 18 20 (!) 22   Temp: 98.8 F (37.1 C) 98.4 F (36.9 C) (!) 97.5 F (36.4 C)   TempSrc: Oral Oral Oral   SpO2: 94% 91% 95% 96%   No intake or output  data in the 24 hours ending 04/17/18 1730 There were no vitals filed for this visit.   Physical Examination:    General: Appears in no acute distress  Cardiovascular: S1-S2, regular  Respiratory: Decreased breath sounds bilaterally, scattered wheezing  Abdomen: Soft, nontender, no organomegaly  Musculoskeletal: No edema in the lower extremities noted      Data Reviewed: I have personally reviewed following labs and imaging studies   Recent Results (from the past 240 hour(s))  Culture, Urine     Status: Abnormal   Collection Time: 04/16/18  9:46 AM  Result Value Ref Range Status   Specimen Description URINE, RANDOM  Final   Special Requests NONE  Final   Culture (A)  Final    <10,000 COLONIES/mL INSIGNIFICANT GROWTH Performed at [facility], [address] [place], [place] [address]    Report Status 04/17/2018 FINAL  Final  Culture, blood (Routine X 2) w Reflex to ID Panel     Status: None (Preliminary result)   Collection Time: 04/16/18 10:07 AM  Result Value Ref Range Status   Specimen Description BLOOD BLOOD RIGHT HAND  Final   Special Requests AEROBIC BOTTLE ONLY Blood Culture adequate volume  Final   Culture   Final    NO GROWTH 1 DAY Performed at [facility], [place] [address] [place], [place] [address]    Report Status PENDING  Incomplete  Culture, blood (Routine X 2) w Reflex to ID Panel     Status: None (Preliminary result)   Collection Time: 04/16/18 10:10 AM  Result Value Ref Range Status   Specimen Description BLOOD LEFT ANTECUBITAL  Final   Special Requests AEROBIC BOTTLE ONLY Blood Culture adequate volume  Final   Culture   Final    NO GROWTH 1 DAY Performed at [facility], [place] [address], [place], [place] [address]    Report Status PENDING  Incomplete     Liver Function Tests: No results for input(s): AST, ALT, ALKPHOS, BILITOT, PROT, ALBUMIN in the last 168 hours. No results for input(s): LIPASE, AMYLASE in the last 168 hours. No results for input(s): AMMONIA in the last 168 hours.  Cardiac Enzymes: No results for input(s): CKTOTAL, CKMB, CKMBINDEX, TROPONINI in the last 168 hours. BNP (last 3 results) Recent Labs    04/15/18 1442  BNP 29.6    ProBNP (last 3 results) No results for input(s): PROBNP in the last 8760 hours.    Studies: No results found.  Scheduled Meds:  enoxaparin (LOVENOX) injection  40 mg Subcutaneous Q24H   guaiFENesin  600 mg Oral BID   methylPREDNISolone (SOLU-MEDROL) injection  60 mg Intravenous Q6H    Admission status: Observation: Based on patients clinical presentation and evaluation of above clinical data, I have made determination that patient will need less than 2 midnights in the hospital.  Still requiring oxygen if oxygen can be  weaned off.  Likely discharge in a.m.  Time spent: 20 min  [facility] Pager [phone]. If 7PM-7AM, please contact night-coverage at [email or web address], Office  [phone]  password [email or web address]  04/17/2018, 5:30 PM  LOS: 0 days

## 2018-04-18 DIAGNOSIS — J209 Acute bronchitis, unspecified: Secondary | ICD-10-CM

## 2018-04-18 DIAGNOSIS — J440 Chronic obstructive pulmonary disease with acute lower respiratory infection: Secondary | ICD-10-CM

## 2018-04-18 LAB — RESPIRATORY PANEL BY PCR

## 2018-04-18 MED ORDER — HYDROCHLOROTHIAZIDE 12.5 MG PO CAPS
12.5000 mg | ORAL_CAPSULE | Freq: Every day | ORAL | Status: DC
  Administered 2018-04-18 – 2018-04-19 (×2): 12.5 mg via ORAL
  Filled 2018-04-18 (×2): qty 1

## 2018-04-18 MED ORDER — HYDRALAZINE HCL 20 MG/ML IJ SOLN
5.0000 mg | Freq: Once | INTRAMUSCULAR | Status: AC
  Administered 2018-04-18: 5 mg via INTRAVENOUS
  Filled 2018-04-18: qty 1

## 2018-04-18 MED ORDER — LISINOPRIL 10 MG PO TABS
10.0000 mg | ORAL_TABLET | Freq: Every day | ORAL | Status: DC
  Administered 2018-04-18 – 2018-04-19 (×2): 10 mg via ORAL
  Filled 2018-04-18 (×2): qty 1

## 2018-04-18 NOTE — Progress Notes (Addendum)
[facility]  PROGRESS NOTE  Sidnie Selke ZOX:[id number] DOB: 09-26-1960 DOA: 04/15/2018 PCP: System, Pcp Not In   Brief HPI:   58 year old female with a history of hypertension, 6 months history of shortness of breath and weight gain.  Patient in the ED was found to have multilobar bronchopneumonia.  Started on IV antibiotics.    Subjective   Patient seen and examined, started coughing last night.  Has bilateral wheezing.  Patient is still requiring oxygen 3 to 4 L via nasal cannula.  She is not on oxygen at home.  Not at baseline.   Assessment/Plan:     1. Community-acquired pneumonia-likely has atypical pneumonia started on Zithromax and Rocephin.  Urinary strep pneumo antigen is negative.  Procalcitonin obtained today is less than 0.10.  Continue with antibiotics.  2. Acute bronchitis/COPD exacerbation-slowly improving, started on duo nebs, Solu-Medrol, Mucinex patient may have underlying COPD, today she has more audible bilateral wheezing.  Add flutter valve every 4 hours.  We will check a respiratory virus panel, droplet precautions, also check influenza PCR.  3. Hypertension-patient stopped taking her medications for hypertension in September.  Blood pressure is now elevated.  Will start her on HCTZ 12.5 mg p.o. daily, lisinopril 10 mg p.o. daily.  4. Chronic diastolic CHF-echocardiogram obtained today shows grade 1 diastolic dysfunction, EF 65 to 70%.  Pulmonary artery pressure within normal limits.  1 dose of Lasix 40 mg IV given.  Not much urine output.  Will not repeat Lasix at this time.     CBG: No results for input(s): GLUCAP in the last 168 hours.  CBC: Recent Labs  Lab 04/15/18 1229 04/16/18 0117 04/17/18 0732  WBC 8.1 7.5 7.1  NEUTROABS 5.2  --   --   HGB 15.3* 14.0 13.4  HCT 48.8* 46.9* 44.3  MCV 93.7 95.1 95.5  PLT 367 329 333    Basic Metabolic Panel: Recent Labs  Lab 04/15/18 1229 04/16/18 0117 04/17/18 0732  NA 142 143 141  K 4.1 4.0 3.9   CL 101 103 102  CO2 31 31 29   GLUCOSE 114* 109* 104*  BUN 9 12 6   CREATININE 1.06* 1.07* 0.86  CALCIUM 9.2 8.8* 8.6*     DVT prophylaxis: Lovenox  Code Status: Full code  Family Communication: Discussed with daughter, sisters at bedside.  Disposition Plan: likely home when medically ready for discharge   Consultants:    Procedures:     Antibiotics:   Anti-infectives (From admission, onward)   Start     Dose/Rate Route Frequency Ordered Stop   04/16/18 1700  cefTRIAXone (ROCEPHIN) 1 g in sodium chloride 0.9 % 100 mL IVPB     1 g 200 mL/hr over 30 Minutes Intravenous Every 24 hours 04/16/18 1221 04/22/18 1659   04/16/18 1700  azithromycin (ZITHROMAX) 500 mg in sodium chloride 0.9 % 250 mL IVPB     500 mg 250 mL/hr over 60 Minutes Intravenous Every 24 hours 04/16/18 1221 04/20/18 1659   04/15/18 1645  cefTRIAXone (ROCEPHIN) 1 g in sodium chloride 0.9 % 100 mL IVPB     1 g 200 mL/hr over 30 Minutes Intravenous  Once 04/15/18 1639 04/15/18 1750   04/15/18 1645  azithromycin (ZITHROMAX) 500 mg in sodium chloride 0.9 % 250 mL IVPB     500 mg 250 mL/hr over 60 Minutes Intravenous  Once 04/15/18 1639 04/15/18 1853       Objective   Vitals:   04/17/18 1355 04/17/18 1438 04/17/18 2242 04/18/18 0532  BP: (!) 144/89  (!) 151/83 (!) 177/94  Pulse: 89  91 93  Resp: (!) 22  17 18   Temp: (!) 97.5 F (36.4 C)  98.6 F (37 C) 97.9 F (36.6 C)  TempSrc: Oral  Oral Oral  SpO2: 95% 96% 95% 96%    Intake/Output Summary (Last 24 hours) at 04/18/2018 0820 Last data filed at 04/18/2018 0532 Gross per 24 hour  Intake 680 ml  Output --  Net 680 ml   There were no vitals filed for this visit.   Physical Examination:    General: Appears in no acute distress  Cardiovascular: S1-S2, regular, no murmur auscultated  Respiratory: Bilateral wheezing auscultated  Abdomen: Soft, nontender, no organomegaly  Musculoskeletal: No edema in the lower extremities  noted.      Data Reviewed: I have personally reviewed following labs and imaging studies   Recent Results (from the past 240 hour(s))  Culture, Urine     Status: Abnormal   Collection Time: 04/16/18  9:46 AM  Result Value Ref Range Status   Specimen Description URINE, RANDOM  Final   Special Requests NONE  Final   Culture (A)  Final    <10,000 COLONIES/mL INSIGNIFICANT GROWTH Performed at [facility], [address] [place], [place] [address]    Report Status 04/17/2018 FINAL  Final  Culture, blood (Routine X 2) w Reflex to ID Panel     Status: None (Preliminary result)   Collection Time: 04/16/18 10:07 AM  Result Value Ref Range Status   Specimen Description BLOOD BLOOD RIGHT HAND  Final   Special Requests AEROBIC BOTTLE ONLY Blood Culture adequate volume  Final   Culture   Final    NO GROWTH 1 DAY Performed at [facility], [address] [place], [place] [address]    Report Status PENDING  Incomplete  Culture, blood (Routine X 2) w Reflex to ID Panel     Status: None (Preliminary result)   Collection Time: 04/16/18 10:10 AM  Result Value Ref Range Status   Specimen Description BLOOD LEFT ANTECUBITAL  Final   Special Requests AEROBIC BOTTLE ONLY Blood Culture adequate volume  Final   Culture   Final    NO GROWTH 1 DAY Performed at [facility], [address] [place], [place] [address]    Report Status PENDING  Incomplete     Liver Function Tests: No results for input(s): AST, ALT, ALKPHOS, BILITOT, PROT, ALBUMIN in the last 168 hours. No results for input(s): LIPASE, AMYLASE in the last 168 hours. No results for input(s): AMMONIA in the last 168 hours.  Cardiac Enzymes: No results for input(s): CKTOTAL, CKMB, CKMBINDEX, TROPONINI in the last 168 hours. BNP (last 3 results) Recent Labs    04/15/18 1442  BNP 29.6    ProBNP (last 3 results) No results for input(s): PROBNP in the last 8760 hours.    Studies: No results  found.  Scheduled Meds: . enoxaparin (LOVENOX) injection  40 mg Subcutaneous Q24H  . guaiFENesin  600 mg Oral BID  . hydrochlorothiazide  12.5 mg Oral Daily  . lisinopril  10 mg Oral Daily  . methylPREDNISolone (SOLU-MEDROL) injection  60 mg Intravenous Q6H    Admission status: Observation: Based on patients clinical presentation and evaluation of above clinical data, I have made determination that patient will need less than 2 midnights in the hospital.  Still requiring oxygen if oxygen can be weaned off.  Likely discharge in a.m.  Time  spent: 20 min  Meredeth Ide   [facility] Pager [phone]. If 7PM-7AM, please contact night-coverage at [email or web address], Office  [phone]  password [email or web address]  04/18/2018, 8:20 AM  LOS: 1 day

## 2018-04-18 NOTE — Plan of Care (Signed)
°  Problem: Pain Managment: Goal: General experience of comfort will improve Outcome: Progressing   Problem: Safety: Goal: Ability to remain free from injury will improve Outcome: Progressing   Problem: Skin Integrity: Goal: Risk for impaired skin integrity will decrease Outcome: Progressing   

## 2018-04-18 NOTE — Progress Notes (Addendum)
Pt BP elevated 177/94. Notified on call K.Kirby,NP via text page with new orders to give Hydralazine 5mg  x1

## 2018-04-19 MED ORDER — LISINOPRIL 10 MG PO TABS: 10.0000 mg | ORAL_TABLET | Freq: Every day | ORAL | 2 refills | Status: DC

## 2018-04-19 MED ORDER — HYDROCODONE-ACETAMINOPHEN 5-325 MG PO TABS: 1.0000 | ORAL_TABLET | Freq: Four times a day (QID) | ORAL | 0 refills | Status: AC | PRN

## 2018-04-19 MED ORDER — IPRATROPIUM-ALBUTEROL 0.5-2.5 (3) MG/3ML IN SOLN: 3.0000 mL | Freq: Four times a day (QID) | RESPIRATORY_TRACT | 1 refills | Status: AC | PRN

## 2018-04-19 MED ORDER — PREDNISONE 10 MG PO TABS: ORAL_TABLET | ORAL | 0 refills | Status: DC

## 2018-04-19 MED ORDER — HYDROCHLOROTHIAZIDE 12.5 MG PO CAPS: 12.5000 mg | ORAL_CAPSULE | Freq: Every day | ORAL | 2 refills | Status: DC

## 2018-04-19 MED ORDER — LEVOFLOXACIN 750 MG PO TABS: 750.0000 mg | ORAL_TABLET | Freq: Every day | ORAL | 0 refills | Status: DC

## 2018-04-19 NOTE — Care Management Note (Signed)
Case Management Note  Patient Details  Name: Tiffany Owen MRN: [id number] Date of Birth: Sep 16, 1960  Subjective/Objective:   Pt admitted on 04/17/18 with CAP/acute bronchitis.  PTA, pt independent and living in [place], [place] alone.  Supportive daughter at bedside.                 Action/Plan: Pt medically stable for discharge home today with daughter.  She will stay with her daughter indefinitely.  Pt requires home oxygen and nebulizer machine; referral to [facility] for DME needs.  Portable O2 tank and neb machine to be delivered to bedside prior to dc.  Discharge address: [address] [place], [place] [address] Phone:  [phone], daughter [name]  Expected Discharge Date:  04/19/18               Expected Discharge Plan:  Index  In-House Referral:     Discharge planning Services  CM Consult  Post Acute Care Choice:  Durable Medical Equipment Choice offered to:  Patient, Adult Children  DME Arranged:  Oxygen, Nebulizer machine DME Agency:  [place]:    [facility] Agency:     Status of Service:  Completed, signed off  If discussed at [place] of Stay Meetings, dates discussed:    Additional Comments:  Ella Bodo, RN 04/19/2018, 12:16 PM

## 2018-04-19 NOTE — Progress Notes (Signed)
Discharged home accompanied by patient's daughter.Personal belongings, prescription,discharged instructions given to patient. Verbalized understanding of instructions

## 2018-04-19 NOTE — Discharge Summary (Signed)
Physician Discharge Summary  Dlynn Ranes TKW:[id number] DOB: 20-Jul-1960 DOA: 04/15/2018  PCP: System, Pcp Not In  Admit date: 04/15/2018 Discharge date: 04/19/2018  Time spent: 45* minutes  Recommendations for Outpatient Follow-up:  1. Patient will be discharged on oxygen 2 L/min via nasal cannula, her O2 sats dropped to 86% on room air. 2. She will need outpatient pulmonary function testing to rule out COPD and sleep study to rule out sleep apnea. 3. We will discharge her on DuoNeb nebulizer every 6 hours as needed for shortness of breath. 4. Prednisone taper for 5 days. 5. Levaquin for 3 more days.   Discharge Diagnoses:  Active Problems:   Pneumonia   Hypoxia   Discharge Condition: Stable  Diet recommendation: Heart healthy diet  There were no vitals filed for this visit.  History of present illness:  58 year old female with a history of hypertension, 6 months history of shortness of breath and weight gain.  Patient in the ED was found to have multilobar bronchopneumonia.  Started on IV antibiotics.   Hospital Course:  1. Community-acquired pneumonia-likely has atypical pneumonia started on Zithromax and Rocephin.  Urinary strep pneumo antigen is negative.  Procalcitonin obtained today is less than 0.10.    Patient received 4 days of antibiotics in the hospital.  Will discharge her on 3 more days of Levaquin.  2. Acute bronchitis/COPD exacerbation-improved, no more wheezing on auscultation.  She was started on duo nebs, Solu-Medrol, Mucinex patient may have underlying COPD.  Respiratory panel was negative.  She is requiring oxygen.  O2 sats dropped to 86% on room air on ambulation.  Will discharge on 2 L/min of oxygen via nasal cannula.  Nebulizer DuoNeb every 6 hours as needed.  She will need outpatient evaluation for spirometry and polysomnography.  3. Hypertension-patient stopped taking her medications for hypertension in September.  Blood pressure is now elevated.     Patient started on  on HCTZ 12.5 mg p.o. daily, lisinopril 10 mg p.o. daily.  4. Chronic diastolic CHF-echocardiogram obtained today shows grade 1 diastolic dysfunction, EF 65 to 70%.  Pulmonary artery pressure within normal limits.  1 dose of Lasix 40 mg IV given.  Not much urine output.    Procedures:    Consultations:   Discharge Exam: Vitals:   04/19/18 0903 04/19/18 0905  BP:    Pulse:    Resp:    Temp:    SpO2: (!) 86% 93%    General: Appears in no acute distress Cardiovascular: S1-S2, regular, no murmur auscultated Respiratory: Clear to auscultation laterally  Discharge Instructions   Discharge Instructions    Diet - low sodium heart healthy   Complete by:  As directed    Increase activity slowly   Complete by:  As directed      Allergies as of 04/19/2018   No Known Allergies     Medication List    TAKE these medications   guaiFENesin 100 MG/5ML Soln Commonly known as:  ROBITUSSIN Take 15 mLs by mouth every 4 (four) hours as needed for cough or to loosen phlegm.   hydrochlorothiazide 12.5 MG capsule Commonly known as:  MICROZIDE Take 1 capsule (12.5 mg total) by mouth daily.   HYDROcodone-acetaminophen 5-325 MG tablet Commonly known as:  NORCO/VICODIN Take 1 tablet by mouth every 6 (six) hours as needed for up to 3 days for moderate pain.   ipratropium-albuterol 0.5-2.5 (3) MG/3ML Soln Commonly known as:  DUONEB Take 3 mLs by nebulization every 6 (six) hours as needed.  levofloxacin 750 MG tablet Commonly known as:  LEVAQUIN Take 1 tablet (750 mg total) by mouth daily.   lisinopril 10 MG tablet Commonly known as:  PRINIVIL,ZESTRIL Take 1 tablet (10 mg total) by mouth daily.   predniSONE 10 MG tablet Commonly known as:  DELTASONE Prednisone 40 mg po daily x 1 day then Prednisone 30 mg po daily x 1 day then Prednisone 20 mg po daily x 1 day then Prednisone 10 mg daily x 1 day then stop...   sodium-potassium bicarbonate Tbef dissolvable  tablet Commonly known as:  ALKA-SELTZER GOLD Take 1 tablet by mouth daily as needed (cold symptoms).            Durable Medical Equipment  (From admission, onward)         Start     Ordered   04/19/18 0911  For home use only DME Nebulizer machine  Once    Question:  Patient needs a nebulizer to treat with the following condition  Answer:  Acute bronchitis   04/19/18 0911   04/19/18 0905  For home use only DME oxygen  Once    Question Answer Comment  Mode or (Route) Nasal cannula   Liters per Minute 2   Frequency Continuous (stationary and portable oxygen unit needed)   Oxygen conserving device Yes   Oxygen delivery system Gas      04/19/18 0904         No Known Allergies    The results of significant diagnostics from this hospitalization (including imaging, microbiology, ancillary and laboratory) are listed below for reference.    Significant Diagnostic Studies: Dg Chest 2 View  Result Date: 04/15/2018 CLINICAL DATA:  Cough for 1 week.  Frequent urination. EXAM: CHEST - 2 VIEW COMPARISON:  None. FINDINGS: Vague densities in the left lower chest which could be related to overlying soft tissues. Upper lungs are clear. Heart size is within normal limits. No large pleural effusions. Bone structures are unremarkable. IMPRESSION: Subtle densities in the left lower chest are nonspecific. Findings could be related overlying soft tissues versus atelectasis. Limited evaluation of the lungs due to body habitus. Electronically Signed   By: Markus Daft M.D.   On: 04/15/2018 14:16   Ct Angio Chest Pe W/cm &/or Wo Cm  Result Date: 04/15/2018 CLINICAL DATA:  58 year old female with history of cough and shortness of breath for the past 2 weeks. No associated chest pain. EXAM: CT ANGIOGRAPHY CHEST WITH CONTRAST TECHNIQUE: Multidetector CT imaging of the chest was performed using the standard protocol during bolus administration of intravenous contrast. Multiplanar CT image reconstructions  and MIPs were obtained to evaluate the vascular anatomy. CONTRAST:  <See Chart> ISOVUE-370 IOPAMIDOL (ISOVUE-370) INJECTION 76% COMPARISON:  No priors. FINDINGS: Cardiovascular: No filling defects within the pulmonary arterial tree to suggest underlying pulmonary embolism. Heart size is mildly enlarged. There is no significant pericardial fluid, thickening or pericardial calcification. Aortic atherosclerosis. No definite coronary artery calcifications. Mediastinum/Nodes: No pathologically enlarged mediastinal or hilar lymph nodes. Esophagus is unremarkable in appearance. No axillary lymphadenopathy. Lungs/Pleura: Patchy multifocal interstitial and airspace disease in the lungs bilaterally, most confluent in the left lower lobe where there is extensive peribronchovascular ground-glass attenuation and areas of consolidation, most compatible with multilobar bronchopneumonia. Area of scarring in the inferior segment of the lingula. No pleural effusions. Upper Abdomen: Diffuse low attenuation throughout the visualized hepatic parenchyma, indicative of severe hepatic steatosis. Musculoskeletal: There are no aggressive appearing lytic or blastic lesions noted in the visualized portions of  the skeleton. Review of the MIP images confirms the above findings. IMPRESSION: 1. No evidence of pulmonary embolism. 2. Multilobar bronchopneumonia, most severe in the left lower lobe. 3. Mild cardiomegaly. 4. Hepatic steatosis. 5. Aortic atherosclerosis. Aortic Atherosclerosis (ICD10-I70.0). Electronically Signed   By: Vinnie Langton M.D.   On: 04/15/2018 16:10    Microbiology: Recent Results (from the past 240 hour(s))  Culture, Urine     Status: Abnormal   Collection Time: 04/16/18  9:46 AM  Result Value Ref Range Status   Specimen Description URINE, RANDOM  Final   Special Requests NONE  Final   Culture (A)  Final    <10,000 COLONIES/mL INSIGNIFICANT GROWTH Performed at [facility], [address]  [place], [place] [address]    Report Status 04/17/2018 FINAL  Final  Culture, blood (Routine X 2) w Reflex to ID Panel     Status: None (Preliminary result)   Collection Time: 04/16/18 10:07 AM  Result Value Ref Range Status   Specimen Description BLOOD BLOOD RIGHT HAND  Final   Special Requests AEROBIC BOTTLE ONLY Blood Culture adequate volume  Final   Culture   Final    NO GROWTH 2 DAYS Performed at [facility], [place] [address] [place], [place] [address]    Report Status PENDING  Incomplete  Culture, blood (Routine X 2) w Reflex to ID Panel     Status: None (Preliminary result)   Collection Time: 04/16/18 10:10 AM  Result Value Ref Range Status   Specimen Description BLOOD LEFT ANTECUBITAL  Final   Special Requests AEROBIC BOTTLE ONLY Blood Culture adequate volume  Final   Culture   Final    NO GROWTH 2 DAYS Performed at [facility], [place] [address] [place], [place] [address]    Report Status PENDING  Incomplete  Respiratory Panel by PCR     Status: None   Collection Time: 04/18/18  8:26 AM  Result Value Ref Range Status   Adenovirus NOT DETECTED NOT DETECTED Final   Coronavirus 229E NOT DETECTED NOT DETECTED Final   Coronavirus HKU1 NOT DETECTED NOT DETECTED Final   Coronavirus NL63 NOT DETECTED NOT DETECTED Final   Coronavirus OC43 NOT DETECTED NOT DETECTED Final   Metapneumovirus NOT DETECTED NOT DETECTED Final   Rhinovirus / Enterovirus NOT DETECTED NOT DETECTED Final   Influenza A NOT DETECTED NOT DETECTED Final   Influenza B NOT DETECTED NOT DETECTED Final   Parainfluenza Virus 1 NOT DETECTED NOT DETECTED Final   Parainfluenza Virus 2 NOT DETECTED NOT DETECTED Final   Parainfluenza Virus 3 NOT DETECTED NOT DETECTED Final   Parainfluenza Virus 4 NOT DETECTED NOT DETECTED Final   Respiratory Syncytial Virus NOT DETECTED NOT DETECTED Final   Bordetella pertussis NOT DETECTED NOT DETECTED Final   Chlamydophila pneumoniae NOT DETECTED NOT DETECTED Final    Mycoplasma pneumoniae NOT DETECTED NOT DETECTED Final    Comment: Performed at [facility], [place] [address], [place], [place] [address]     Labs: Basic Metabolic Panel: Recent Labs  Lab 04/15/18 1229 04/16/18 0117 04/17/18 0732  NA 142 143 141  K 4.1 4.0 3.9  CL 101 103 102  CO2 31 31 29   GLUCOSE 114* 109* 104*  BUN 9 12 6   CREATININE 1.06* 1.07* 0.86  CALCIUM 9.2 8.8* 8.6*   Liver Function Tests: No results for input(s): AST, ALT, ALKPHOS, BILITOT, PROT, ALBUMIN in the last 168 hours. No results for input(s): LIPASE, AMYLASE  in the last 168 hours. No results for input(s): AMMONIA in the last 168 hours. CBC: Recent Labs  Lab 04/15/18 1229 04/16/18 0117 04/17/18 0732  WBC 8.1 7.5 7.1  NEUTROABS 5.2  --   --   HGB 15.3* 14.0 13.4  HCT 48.8* 46.9* 44.3  MCV 93.7 95.1 95.5  PLT 367 329 333   Cardiac Enzymes: No results for input(s): CKTOTAL, CKMB, CKMBINDEX, TROPONINI in the last 168 hours. BNP: BNP (last 3 results) Recent Labs    04/15/18 1442  BNP 29.6     Signed:  Oswald Hillock MD.  [facility] 04/19/2018, 9:16 AM

## 2018-04-19 NOTE — Care Management (Signed)
SATURATION QUALIFICATIONS: (This note is used to comply with regulatory documentation for home oxygen)  Patient Saturations on Room Air at Rest = 85%  Patient Saturations on Room Air while Ambulating =   Patient Saturations on  Liters of oxygen while Ambulating =   Please briefly explain why patient needs home oxygen: Pt with hypoxia at rest and while sleeping.    Reinaldo Raddle, RN, BSN  Trauma/Neuro ICU Case Manager [phone]

## 2018-04-21 LAB — CULTURE, BLOOD (ROUTINE X 2)
Culture: NO GROWTH
Culture: NO GROWTH
SPECIAL REQUESTS: ADEQUATE
Special Requests: ADEQUATE

## 2018-04-30 DIAGNOSIS — I5189 Other ill-defined heart diseases: Secondary | ICD-10-CM | POA: Diagnosis not present

## 2018-04-30 DIAGNOSIS — G4730 Sleep apnea, unspecified: Secondary | ICD-10-CM | POA: Diagnosis not present

## 2018-04-30 DIAGNOSIS — R0902 Hypoxemia: Secondary | ICD-10-CM | POA: Diagnosis not present

## 2018-05-11 ENCOUNTER — Ambulatory Visit (INDEPENDENT_AMBULATORY_CARE_PROVIDER_SITE_OTHER): Payer: BLUE CROSS/BLUE SHIELD | Admitting: Pulmonary Disease

## 2018-05-11 ENCOUNTER — Ambulatory Visit (INDEPENDENT_AMBULATORY_CARE_PROVIDER_SITE_OTHER)
Admission: RE | Admit: 2018-05-11 | Discharge: 2018-05-11 | Disposition: A | Discharge status: Home / Self Care | Payer: BLUE CROSS/BLUE SHIELD | Source: Ambulatory Visit | Attending: Pulmonary Disease | Admitting: Pulmonary Disease

## 2018-05-11 ENCOUNTER — Encounter: Payer: Self-pay | Admitting: Pulmonary Disease

## 2018-05-11 VITALS — BP 126/86 | HR 95 | Ht <= 58 in | Wt 263.2 lb

## 2018-05-11 DIAGNOSIS — R0602 Shortness of breath: Secondary | ICD-10-CM | POA: Diagnosis not present

## 2018-05-11 DIAGNOSIS — G4733 Obstructive sleep apnea (adult) (pediatric): Secondary | ICD-10-CM | POA: Diagnosis not present

## 2018-05-11 DIAGNOSIS — J9611 Chronic respiratory failure with hypoxia: Secondary | ICD-10-CM

## 2018-05-11 DIAGNOSIS — J449 Chronic obstructive pulmonary disease, unspecified: Secondary | ICD-10-CM

## 2018-05-11 DIAGNOSIS — J189 Pneumonia, unspecified organism: Secondary | ICD-10-CM | POA: Diagnosis not present

## 2018-05-11 MED ORDER — TIOTROPIUM BROMIDE-OLODATEROL 2.5-2.5 MCG/ACT IN AERS: 2.0000 | INHALATION_SPRAY | Freq: Every day | RESPIRATORY_TRACT | 0 refills | Status: DC

## 2018-05-11 NOTE — Addendum Note (Signed)
Addended by: Valerie Salts on: 05/11/2018 10:52 AM   Modules accepted: Orders

## 2018-05-11 NOTE — Assessment & Plan Note (Signed)
Feel spirometry reflects a mixed picture of obstruction and restriction due to obesity. Trial of Stiolto -discussed side effects, she will call back for prescription if this works

## 2018-05-11 NOTE — Progress Notes (Signed)
Subjective:    Patient ID: Tiffany Owen, female    DOB: March 03, 1961, 58 y.o.   MRN: [id number]  HPI  Chief Complaint  Patient presents with   Sleep Consult    Referred by Dr. Dorthy Cooler for possible OSA. States she sleeps with 2L of O2. Uses [facility] as her DME.      58 year old morbidly obese ex-smoker presents for evaluation of COPD and sleep apnea after recent hospitalization. She smoked about a pack every 3 days until she quit in 2016 about 15 pack years. She reports shortness of breath for about 6 months until she developed cold-like symptoms and was hospitalized 12/29 for 3 days.  She was found to be hypoxic and even on discharge oxygen saturation was 86% and she was discharged on oxygen.  Respiratory viral panel was negative, urine strep antigen was negative, procalcitonin was less than 0.1 and there was no leukocytosis.  Chest x-ray showed haziness left lower lung field and on my review cannot differentiate this from breast shadow. CT angiogram chest 12/29 showed patchy infiltrates in the left lower lobe. She was treated with antibiotics and feels significantly improved. She is only using oxygen during sleep at night. She uses duo nebs on an as-needed basis  She is accompanied by her daughter who corroborates history.  She feels much improved since hospitalization and is now able to sleep on her back. She reports 100 pound weight gain since she quit smoking in 2016. She reports excessive daytime somnolence with Epworth sleepiness score is 21, reports sleepiness while sitting and reading, watching TV or sitting inactive in a public place such as church or as a passenger in a car. Bedtime is around 11 PM, sleep latency is minimal, she sleeps on her side with one pillow, reports 2-3 nocturnal awakenings including nocturia and is out of bed by 9 AM feeling tired with dryness of mouth but denies headaches.  There is no history suggestive of cataplexy, sleep paralysis or  parasomnias   Spirometry showed severe airway obstruction and restriction with ratio of 72, FEV1 of 41% FVC of 45%  Oxygen saturation was 94% on arrival at rest today  Medication review shows lisinopril but she is tolerating this well and denies cough at this time  Past Medical History:  Diagnosis Date   CHF (congestive heart failure) ([place])    Hypertension    Pneumonia 04/17/2018   Past Surgical History:  Procedure Laterality Date   CESAREAN SECTION  1979; 1981; [place]    No Known Allergies  Social History   Socioeconomic History   Marital status: Single    Spouse name: Not on file   Number of children: Not on file   Years of education: Not on file   Highest education level: Not on file  Occupational History   Not on file  Social Needs   Financial resource strain: Not on file   Food insecurity:    Worry: Not on file    Inability: Not on file   Transportation needs:    Medical: Not on file    Non-medical: Not on file  Tobacco Use   Smoking status: Former Smoker    Packs/day: 1.00    Years: 41.00    Pack years: 41.00    Types: Cigarettes    Last attempt to quit: 07/01/2014    Years since quitting: 3.8   Smokeless tobacco: Never Used  Substance and Sexual Activity   Alcohol use: Never  Frequency: Never   Drug use: Never   Sexual activity: Not on file  Lifestyle   Physical activity:    Days per week: Not on file    Minutes per session: Not on file   Stress: Not on file  Relationships   Social connections:    Talks on phone: Not on file    Gets together: Not on file    Attends religious service: Not on file    Active member of club or organization: Not on file    Attends meetings of clubs or organizations: Not on file    Relationship status: Not on file   Intimate partner violence:    Fear of current or ex partner: Not on file    Emotionally abused: Not on file    Physically abused: Not on file    Forced  sexual activity: Not on file  Other Topics Concern   Not on file  Social History Narrative   Not on file     Family History  Problem Relation Age of Onset   Cancer Mother    Diabetes Mellitus II Father    Cancer Father    Cancer Brother      Review of Systems  Constitutional: Positive for unexpected weight change. Negative for fever.  HENT: Negative for congestion, dental problem, ear pain, nosebleeds, postnasal drip, rhinorrhea, sinus pressure, sneezing, sore throat and trouble swallowing.   Eyes: Negative for redness and itching.  Respiratory: Positive for shortness of breath. Negative for cough, chest tightness and wheezing.   Cardiovascular: Negative for palpitations and leg swelling.  Gastrointestinal: Negative for nausea and vomiting.  Genitourinary: Negative for dysuria.  Musculoskeletal: Negative for joint swelling.  Skin: Negative for rash.  Allergic/Immunologic: Negative.  Negative for environmental allergies, food allergies and immunocompromised state.  Neurological: Negative for headaches.  Hematological: Does not bruise/bleed easily.  Psychiatric/Behavioral: Negative for dysphoric mood. The patient is not nervous/anxious.        Objective:   Physical Exam   Gen. Pleasant, obese, in no distress, normal affect ENT - no pallor,icterus, no post nasal drip, class 3 airway, short neck Neck: No JVD, no thyromegaly, no carotid bruits Lungs: no use of accessory muscles, no dullness to percussion, decreased without rales or rhonchi  Cardiovascular: Rhythm regular, heart sounds  normal, no murmurs or gallops, no peripheral edema Abdomen: soft and non-tender, no hepatosplenomegaly, BS normal. Musculoskeletal: No deformities, no cyanosis or clubbing Neuro:  alert, non focal, no tremors Skin - no rash      Assessment & Plan:

## 2018-05-11 NOTE — Assessment & Plan Note (Signed)
Given excessive daytime somnolence, narrow pharyngeal exam, witnessed apneas & loud snoring, obstructive sleep apnea is very likely & an overnight polysomnogram will be scheduled as a home study. The pathophysiology of obstructive sleep apnea , it's cardiovascular consequences & modes of treatment including CPAP were discused with the patient in detail & they evidenced understanding.  Pretest probability is high and she will likely need CPAP machine

## 2018-05-11 NOTE — Progress Notes (Signed)
Patient seen in the office today and instructed on use of Stiolto.  Patient expressed understanding and demonstrated technique.  Tiffany Owen [facility] 05/11/2018

## 2018-05-11 NOTE — Assessment & Plan Note (Signed)
She can stay off oxygen while at rest, continue to use it during sleep until we assess during home sleep study

## 2018-05-11 NOTE — Patient Instructions (Addendum)
Chest x-ray today.  Breathing test shows evidence of COPD.  Trial of STIOLTO once daily.  Call me back for prescription if this works and is covered by her insurance  Schedule home sleep study, based on this we will decide whether you need CPAP device

## 2018-05-11 NOTE — Assessment & Plan Note (Signed)
Follow-up chest x-ray today for resolution of left lower lobe pneumonia

## 2018-05-20 DIAGNOSIS — R0902 Hypoxemia: Secondary | ICD-10-CM | POA: Diagnosis not present

## 2018-06-04 DIAGNOSIS — G4733 Obstructive sleep apnea (adult) (pediatric): Secondary | ICD-10-CM

## 2018-06-06 DIAGNOSIS — G4730 Sleep apnea, unspecified: Secondary | ICD-10-CM | POA: Diagnosis not present

## 2018-06-06 DIAGNOSIS — I10 Essential (primary) hypertension: Secondary | ICD-10-CM | POA: Diagnosis not present

## 2018-06-06 DIAGNOSIS — J449 Chronic obstructive pulmonary disease, unspecified: Secondary | ICD-10-CM | POA: Diagnosis not present

## 2018-06-13 DIAGNOSIS — G4733 Obstructive sleep apnea (adult) (pediatric): Secondary | ICD-10-CM | POA: Diagnosis not present

## 2018-06-14 DIAGNOSIS — G4733 Obstructive sleep apnea (adult) (pediatric): Secondary | ICD-10-CM | POA: Diagnosis not present

## 2018-06-14 DIAGNOSIS — M25561 Pain in right knee: Secondary | ICD-10-CM | POA: Diagnosis not present

## 2018-06-15 ENCOUNTER — Telehealth: Payer: Self-pay | Admitting: Pulmonary Disease

## 2018-06-15 DIAGNOSIS — G4733 Obstructive sleep apnea (adult) (pediatric): Secondary | ICD-10-CM

## 2018-06-15 NOTE — Telephone Encounter (Signed)
HST 05/2018 showed moderate OSA, AHI 16/hour with severe oxygen desaturations. Hence suggest CPAP titration study to see whether she will need oxygen with CPAP

## 2018-06-15 NOTE — Telephone Encounter (Signed)
Spoke with pt and relayed results.  Pt was agreeable to sleep titration study.  Order placed.

## 2018-06-18 DIAGNOSIS — R092 Respiratory arrest: Secondary | ICD-10-CM | POA: Diagnosis not present

## 2018-06-18 DIAGNOSIS — J189 Pneumonia, unspecified organism: Secondary | ICD-10-CM | POA: Diagnosis not present

## 2018-06-20 DIAGNOSIS — I10 Essential (primary) hypertension: Secondary | ICD-10-CM | POA: Diagnosis not present

## 2018-07-19 DIAGNOSIS — R092 Respiratory arrest: Secondary | ICD-10-CM | POA: Diagnosis not present

## 2018-07-19 DIAGNOSIS — J189 Pneumonia, unspecified organism: Secondary | ICD-10-CM | POA: Diagnosis not present

## 2018-07-26 ENCOUNTER — Encounter (HOSPITAL_BASED_OUTPATIENT_CLINIC_OR_DEPARTMENT_OTHER): Payer: BLUE CROSS/BLUE SHIELD

## 2018-08-18 DIAGNOSIS — R0902 Hypoxemia: Secondary | ICD-10-CM | POA: Diagnosis not present

## 2018-08-18 DIAGNOSIS — J189 Pneumonia, unspecified organism: Secondary | ICD-10-CM | POA: Diagnosis not present

## 2018-09-18 DIAGNOSIS — J189 Pneumonia, unspecified organism: Secondary | ICD-10-CM | POA: Diagnosis not present

## 2018-09-18 DIAGNOSIS — R0902 Hypoxemia: Secondary | ICD-10-CM | POA: Diagnosis not present

## 2018-10-11 DIAGNOSIS — J449 Chronic obstructive pulmonary disease, unspecified: Secondary | ICD-10-CM | POA: Diagnosis not present

## 2018-10-11 DIAGNOSIS — I10 Essential (primary) hypertension: Secondary | ICD-10-CM | POA: Diagnosis not present

## 2018-10-11 DIAGNOSIS — G4730 Sleep apnea, unspecified: Secondary | ICD-10-CM | POA: Diagnosis not present

## 2018-10-18 DIAGNOSIS — R0902 Hypoxemia: Secondary | ICD-10-CM | POA: Diagnosis not present

## 2018-10-18 DIAGNOSIS — J189 Pneumonia, unspecified organism: Secondary | ICD-10-CM | POA: Diagnosis not present

## 2018-10-24 DIAGNOSIS — R05 Cough: Secondary | ICD-10-CM | POA: Diagnosis not present

## 2018-10-24 DIAGNOSIS — J449 Chronic obstructive pulmonary disease, unspecified: Secondary | ICD-10-CM | POA: Diagnosis not present

## 2018-11-18 DIAGNOSIS — R0902 Hypoxemia: Secondary | ICD-10-CM | POA: Diagnosis not present

## 2018-11-18 DIAGNOSIS — J189 Pneumonia, unspecified organism: Secondary | ICD-10-CM | POA: Diagnosis not present

## 2018-12-19 DIAGNOSIS — R0902 Hypoxemia: Secondary | ICD-10-CM | POA: Diagnosis not present

## 2018-12-19 DIAGNOSIS — J189 Pneumonia, unspecified organism: Secondary | ICD-10-CM | POA: Diagnosis not present

## 2019-01-18 DIAGNOSIS — J189 Pneumonia, unspecified organism: Secondary | ICD-10-CM | POA: Diagnosis not present

## 2019-01-18 DIAGNOSIS — R0902 Hypoxemia: Secondary | ICD-10-CM | POA: Diagnosis not present

## 2019-01-31 DIAGNOSIS — M79672 Pain in left foot: Secondary | ICD-10-CM | POA: Diagnosis not present

## 2019-01-31 DIAGNOSIS — R262 Difficulty in walking, not elsewhere classified: Secondary | ICD-10-CM | POA: Diagnosis not present

## 2019-01-31 DIAGNOSIS — M6701 Short Achilles tendon (acquired), right ankle: Secondary | ICD-10-CM | POA: Diagnosis not present

## 2019-01-31 DIAGNOSIS — M722 Plantar fascial fibromatosis: Secondary | ICD-10-CM | POA: Diagnosis not present

## 2019-02-18 DIAGNOSIS — J189 Pneumonia, unspecified organism: Secondary | ICD-10-CM | POA: Diagnosis not present

## 2019-02-18 DIAGNOSIS — R0902 Hypoxemia: Secondary | ICD-10-CM | POA: Diagnosis not present

## 2019-03-20 DIAGNOSIS — J189 Pneumonia, unspecified organism: Secondary | ICD-10-CM | POA: Diagnosis not present

## 2019-03-20 DIAGNOSIS — R0902 Hypoxemia: Secondary | ICD-10-CM | POA: Diagnosis not present

## 2019-04-20 DIAGNOSIS — R0902 Hypoxemia: Secondary | ICD-10-CM | POA: Diagnosis not present

## 2019-04-20 DIAGNOSIS — J189 Pneumonia, unspecified organism: Secondary | ICD-10-CM | POA: Diagnosis not present

## 2019-05-21 DIAGNOSIS — R0902 Hypoxemia: Secondary | ICD-10-CM | POA: Diagnosis not present

## 2019-05-21 DIAGNOSIS — J189 Pneumonia, unspecified organism: Secondary | ICD-10-CM | POA: Diagnosis not present

## 2019-06-18 DIAGNOSIS — R0902 Hypoxemia: Secondary | ICD-10-CM | POA: Diagnosis not present

## 2019-06-18 DIAGNOSIS — J189 Pneumonia, unspecified organism: Secondary | ICD-10-CM | POA: Diagnosis not present

## 2019-07-19 DIAGNOSIS — J189 Pneumonia, unspecified organism: Secondary | ICD-10-CM | POA: Diagnosis not present

## 2019-07-19 DIAGNOSIS — R0902 Hypoxemia: Secondary | ICD-10-CM | POA: Diagnosis not present

## 2019-07-26 DIAGNOSIS — G4730 Sleep apnea, unspecified: Secondary | ICD-10-CM | POA: Diagnosis not present

## 2019-07-26 DIAGNOSIS — I10 Essential (primary) hypertension: Secondary | ICD-10-CM | POA: Diagnosis not present

## 2019-07-26 DIAGNOSIS — Z1329 Encounter for screening for other suspected endocrine disorder: Secondary | ICD-10-CM | POA: Diagnosis not present

## 2019-07-26 DIAGNOSIS — I5189 Other ill-defined heart diseases: Secondary | ICD-10-CM | POA: Diagnosis not present

## 2019-07-26 DIAGNOSIS — J449 Chronic obstructive pulmonary disease, unspecified: Secondary | ICD-10-CM | POA: Diagnosis not present

## 2019-07-26 DIAGNOSIS — R635 Abnormal weight gain: Secondary | ICD-10-CM | POA: Diagnosis not present

## 2019-07-29 DIAGNOSIS — Z1211 Encounter for screening for malignant neoplasm of colon: Secondary | ICD-10-CM | POA: Diagnosis not present

## 2019-08-18 DIAGNOSIS — J189 Pneumonia, unspecified organism: Secondary | ICD-10-CM | POA: Diagnosis not present

## 2019-08-18 DIAGNOSIS — R0902 Hypoxemia: Secondary | ICD-10-CM | POA: Diagnosis not present

## 2019-09-18 DIAGNOSIS — J189 Pneumonia, unspecified organism: Secondary | ICD-10-CM | POA: Diagnosis not present

## 2019-09-18 DIAGNOSIS — R0902 Hypoxemia: Secondary | ICD-10-CM | POA: Diagnosis not present

## 2019-10-18 DIAGNOSIS — R0902 Hypoxemia: Secondary | ICD-10-CM | POA: Diagnosis not present

## 2019-10-18 DIAGNOSIS — J189 Pneumonia, unspecified organism: Secondary | ICD-10-CM | POA: Diagnosis not present

## 2019-11-18 DIAGNOSIS — J189 Pneumonia, unspecified organism: Secondary | ICD-10-CM | POA: Diagnosis not present

## 2019-11-18 DIAGNOSIS — R0902 Hypoxemia: Secondary | ICD-10-CM | POA: Diagnosis not present

## 2019-11-27 DIAGNOSIS — I5189 Other ill-defined heart diseases: Secondary | ICD-10-CM | POA: Diagnosis not present

## 2019-11-27 DIAGNOSIS — R0602 Shortness of breath: Secondary | ICD-10-CM | POA: Diagnosis not present

## 2019-11-27 DIAGNOSIS — G4730 Sleep apnea, unspecified: Secondary | ICD-10-CM | POA: Diagnosis not present

## 2019-11-29 ENCOUNTER — Other Ambulatory Visit: Payer: Self-pay | Admitting: Family Medicine

## 2019-11-29 ENCOUNTER — Other Ambulatory Visit: Payer: Self-pay

## 2019-11-29 ENCOUNTER — Ambulatory Visit
Admission: RE | Admit: 2019-11-29 | Discharge: 2019-11-29 | Disposition: A | Discharge status: Home / Self Care | Payer: BC Managed Care – PPO | Source: Ambulatory Visit | Attending: Family Medicine | Admitting: Family Medicine

## 2019-11-29 DIAGNOSIS — R0602 Shortness of breath: Secondary | ICD-10-CM

## 2019-12-19 DIAGNOSIS — R0902 Hypoxemia: Secondary | ICD-10-CM | POA: Diagnosis not present

## 2020-01-18 DIAGNOSIS — R0902 Hypoxemia: Secondary | ICD-10-CM | POA: Diagnosis not present

## 2020-02-18 DIAGNOSIS — R0902 Hypoxemia: Secondary | ICD-10-CM | POA: Diagnosis not present

## 2020-03-19 DIAGNOSIS — R0902 Hypoxemia: Secondary | ICD-10-CM | POA: Diagnosis not present

## 2020-04-19 DIAGNOSIS — R0902 Hypoxemia: Secondary | ICD-10-CM | POA: Diagnosis not present

## 2020-05-20 DIAGNOSIS — R0902 Hypoxemia: Secondary | ICD-10-CM | POA: Diagnosis not present

## 2020-06-17 DIAGNOSIS — R0902 Hypoxemia: Secondary | ICD-10-CM | POA: Diagnosis not present

## 2020-06-22 DIAGNOSIS — I509 Heart failure, unspecified: Secondary | ICD-10-CM | POA: Diagnosis not present

## 2020-06-22 DIAGNOSIS — N390 Urinary tract infection, site not specified: Secondary | ICD-10-CM | POA: Diagnosis not present

## 2020-06-22 DIAGNOSIS — J180 Bronchopneumonia, unspecified organism: Secondary | ICD-10-CM | POA: Diagnosis not present

## 2020-06-22 DIAGNOSIS — R079 Chest pain, unspecified: Secondary | ICD-10-CM | POA: Diagnosis not present

## 2020-06-22 DIAGNOSIS — J9601 Acute respiratory failure with hypoxia: Secondary | ICD-10-CM | POA: Diagnosis not present

## 2020-06-22 DIAGNOSIS — R634 Abnormal weight loss: Secondary | ICD-10-CM | POA: Diagnosis not present

## 2020-06-22 DIAGNOSIS — I110 Hypertensive heart disease with heart failure: Secondary | ICD-10-CM | POA: Diagnosis not present

## 2020-06-22 DIAGNOSIS — Z6841 Body Mass Index (BMI) 40.0 and over, adult: Secondary | ICD-10-CM | POA: Diagnosis not present

## 2020-06-22 DIAGNOSIS — D861 Sarcoidosis of lymph nodes: Secondary | ICD-10-CM | POA: Diagnosis not present

## 2020-06-22 DIAGNOSIS — I5031 Acute diastolic (congestive) heart failure: Secondary | ICD-10-CM | POA: Diagnosis not present

## 2020-06-22 DIAGNOSIS — Z79899 Other long term (current) drug therapy: Secondary | ICD-10-CM | POA: Diagnosis not present

## 2020-06-22 DIAGNOSIS — R0902 Hypoxemia: Secondary | ICD-10-CM | POA: Diagnosis not present

## 2020-06-22 DIAGNOSIS — I517 Cardiomegaly: Secondary | ICD-10-CM | POA: Diagnosis not present

## 2020-06-22 DIAGNOSIS — R911 Solitary pulmonary nodule: Secondary | ICD-10-CM | POA: Diagnosis not present

## 2020-06-22 DIAGNOSIS — Z87891 Personal history of nicotine dependence: Secondary | ICD-10-CM | POA: Diagnosis not present

## 2020-06-22 DIAGNOSIS — J811 Chronic pulmonary edema: Secondary | ICD-10-CM | POA: Diagnosis not present

## 2020-06-22 DIAGNOSIS — D381 Neoplasm of uncertain behavior of trachea, bronchus and lung: Secondary | ICD-10-CM | POA: Diagnosis not present

## 2020-06-22 DIAGNOSIS — I10 Essential (primary) hypertension: Secondary | ICD-10-CM | POA: Diagnosis not present

## 2020-06-22 DIAGNOSIS — G4710 Hypersomnia, unspecified: Secondary | ICD-10-CM | POA: Diagnosis not present

## 2020-06-22 DIAGNOSIS — I5033 Acute on chronic diastolic (congestive) heart failure: Secondary | ICD-10-CM | POA: Diagnosis not present

## 2020-06-22 DIAGNOSIS — Z20822 Contact with and (suspected) exposure to covid-19: Secondary | ICD-10-CM | POA: Diagnosis not present

## 2020-06-22 DIAGNOSIS — R0602 Shortness of breath: Secondary | ICD-10-CM | POA: Diagnosis not present

## 2020-06-22 DIAGNOSIS — R590 Localized enlarged lymph nodes: Secondary | ICD-10-CM | POA: Diagnosis not present

## 2020-07-08 DIAGNOSIS — E785 Hyperlipidemia, unspecified: Secondary | ICD-10-CM | POA: Diagnosis not present

## 2020-07-08 DIAGNOSIS — K8689 Other specified diseases of pancreas: Secondary | ICD-10-CM | POA: Diagnosis not present

## 2020-07-08 DIAGNOSIS — E538 Deficiency of other specified B group vitamins: Secondary | ICD-10-CM | POA: Diagnosis not present

## 2020-07-08 DIAGNOSIS — G4733 Obstructive sleep apnea (adult) (pediatric): Secondary | ICD-10-CM | POA: Diagnosis not present

## 2020-07-08 DIAGNOSIS — Z1159 Encounter for screening for other viral diseases: Secondary | ICD-10-CM | POA: Diagnosis not present

## 2020-07-08 DIAGNOSIS — I10 Essential (primary) hypertension: Secondary | ICD-10-CM | POA: Diagnosis not present

## 2020-07-08 DIAGNOSIS — Z6841 Body Mass Index (BMI) 40.0 and over, adult: Secondary | ICD-10-CM | POA: Diagnosis not present

## 2020-07-08 DIAGNOSIS — N2889 Other specified disorders of kidney and ureter: Secondary | ICD-10-CM | POA: Diagnosis not present

## 2020-07-10 DIAGNOSIS — I10 Essential (primary) hypertension: Secondary | ICD-10-CM | POA: Diagnosis not present

## 2020-07-10 DIAGNOSIS — I519 Heart disease, unspecified: Secondary | ICD-10-CM | POA: Diagnosis not present

## 2020-07-18 DIAGNOSIS — R0902 Hypoxemia: Secondary | ICD-10-CM | POA: Diagnosis not present

## 2020-07-24 DIAGNOSIS — R918 Other nonspecific abnormal finding of lung field: Secondary | ICD-10-CM | POA: Diagnosis not present

## 2020-07-24 DIAGNOSIS — R599 Enlarged lymph nodes, unspecified: Secondary | ICD-10-CM | POA: Diagnosis not present

## 2020-07-25 DIAGNOSIS — I5033 Acute on chronic diastolic (congestive) heart failure: Secondary | ICD-10-CM | POA: Diagnosis not present

## 2020-07-28 DIAGNOSIS — I5032 Chronic diastolic (congestive) heart failure: Secondary | ICD-10-CM | POA: Diagnosis not present

## 2020-07-28 DIAGNOSIS — R590 Localized enlarged lymph nodes: Secondary | ICD-10-CM | POA: Diagnosis not present

## 2020-07-28 DIAGNOSIS — R918 Other nonspecific abnormal finding of lung field: Secondary | ICD-10-CM | POA: Diagnosis not present

## 2020-07-28 DIAGNOSIS — R0683 Snoring: Secondary | ICD-10-CM | POA: Diagnosis not present

## 2020-08-02 DIAGNOSIS — J449 Chronic obstructive pulmonary disease, unspecified: Secondary | ICD-10-CM | POA: Diagnosis not present

## 2020-08-17 DIAGNOSIS — R0902 Hypoxemia: Secondary | ICD-10-CM | POA: Diagnosis not present

## 2020-08-18 DIAGNOSIS — J449 Chronic obstructive pulmonary disease, unspecified: Secondary | ICD-10-CM | POA: Diagnosis not present

## 2020-08-23 DIAGNOSIS — G4733 Obstructive sleep apnea (adult) (pediatric): Secondary | ICD-10-CM | POA: Diagnosis not present

## 2020-08-23 DIAGNOSIS — J449 Chronic obstructive pulmonary disease, unspecified: Secondary | ICD-10-CM | POA: Diagnosis not present

## 2020-08-24 DIAGNOSIS — I5033 Acute on chronic diastolic (congestive) heart failure: Secondary | ICD-10-CM | POA: Diagnosis not present

## 2020-08-31 DIAGNOSIS — R591 Generalized enlarged lymph nodes: Secondary | ICD-10-CM | POA: Diagnosis not present

## 2020-09-08 DIAGNOSIS — Z20822 Contact with and (suspected) exposure to covid-19: Secondary | ICD-10-CM | POA: Diagnosis not present

## 2020-09-08 DIAGNOSIS — Z01812 Encounter for preprocedural laboratory examination: Secondary | ICD-10-CM | POA: Diagnosis not present

## 2020-09-11 DIAGNOSIS — I10 Essential (primary) hypertension: Secondary | ICD-10-CM | POA: Diagnosis not present

## 2020-09-11 DIAGNOSIS — R911 Solitary pulmonary nodule: Secondary | ICD-10-CM | POA: Diagnosis not present

## 2020-09-11 DIAGNOSIS — Z87891 Personal history of nicotine dependence: Secondary | ICD-10-CM | POA: Diagnosis not present

## 2020-09-11 DIAGNOSIS — I2510 Atherosclerotic heart disease of native coronary artery without angina pectoris: Secondary | ICD-10-CM | POA: Diagnosis not present

## 2020-09-11 DIAGNOSIS — R590 Localized enlarged lymph nodes: Secondary | ICD-10-CM | POA: Diagnosis not present

## 2020-09-11 DIAGNOSIS — R7303 Prediabetes: Secondary | ICD-10-CM | POA: Diagnosis not present

## 2020-09-11 DIAGNOSIS — J984 Other disorders of lung: Secondary | ICD-10-CM | POA: Diagnosis not present

## 2020-09-11 DIAGNOSIS — J432 Centrilobular emphysema: Secondary | ICD-10-CM | POA: Diagnosis not present

## 2020-09-11 DIAGNOSIS — Z79899 Other long term (current) drug therapy: Secondary | ICD-10-CM | POA: Diagnosis not present

## 2020-09-11 DIAGNOSIS — R591 Generalized enlarged lymph nodes: Secondary | ICD-10-CM | POA: Diagnosis not present

## 2020-09-11 DIAGNOSIS — C799 Secondary malignant neoplasm of unspecified site: Secondary | ICD-10-CM | POA: Diagnosis not present

## 2020-09-11 DIAGNOSIS — C3490 Malignant neoplasm of unspecified part of unspecified bronchus or lung: Secondary | ICD-10-CM | POA: Diagnosis not present

## 2020-09-11 DIAGNOSIS — R918 Other nonspecific abnormal finding of lung field: Secondary | ICD-10-CM | POA: Diagnosis not present

## 2020-09-11 DIAGNOSIS — M545 Low back pain, unspecified: Secondary | ICD-10-CM | POA: Diagnosis not present

## 2020-09-17 DIAGNOSIS — G4733 Obstructive sleep apnea (adult) (pediatric): Secondary | ICD-10-CM | POA: Diagnosis not present

## 2020-09-17 DIAGNOSIS — J449 Chronic obstructive pulmonary disease, unspecified: Secondary | ICD-10-CM | POA: Diagnosis not present

## 2020-09-17 DIAGNOSIS — R0902 Hypoxemia: Secondary | ICD-10-CM | POA: Diagnosis not present

## 2020-09-17 DIAGNOSIS — C3490 Malignant neoplasm of unspecified part of unspecified bronchus or lung: Secondary | ICD-10-CM | POA: Diagnosis not present

## 2020-09-24 DIAGNOSIS — I5033 Acute on chronic diastolic (congestive) heart failure: Secondary | ICD-10-CM | POA: Diagnosis not present

## 2020-09-29 DIAGNOSIS — Z6841 Body Mass Index (BMI) 40.0 and over, adult: Secondary | ICD-10-CM | POA: Diagnosis not present

## 2020-09-29 DIAGNOSIS — J449 Chronic obstructive pulmonary disease, unspecified: Secondary | ICD-10-CM | POA: Diagnosis not present

## 2020-09-29 DIAGNOSIS — Z1331 Encounter for screening for depression: Secondary | ICD-10-CM | POA: Diagnosis not present

## 2020-09-29 DIAGNOSIS — C3490 Malignant neoplasm of unspecified part of unspecified bronchus or lung: Secondary | ICD-10-CM | POA: Diagnosis not present

## 2020-10-06 DIAGNOSIS — C3412 Malignant neoplasm of upper lobe, left bronchus or lung: Secondary | ICD-10-CM | POA: Diagnosis not present

## 2020-10-06 DIAGNOSIS — R5381 Other malaise: Secondary | ICD-10-CM | POA: Diagnosis not present

## 2020-10-06 DIAGNOSIS — Z5112 Encounter for antineoplastic immunotherapy: Secondary | ICD-10-CM | POA: Diagnosis not present

## 2020-10-06 DIAGNOSIS — Z87891 Personal history of nicotine dependence: Secondary | ICD-10-CM | POA: Diagnosis not present

## 2020-10-06 DIAGNOSIS — R591 Generalized enlarged lymph nodes: Secondary | ICD-10-CM | POA: Diagnosis not present

## 2020-10-06 DIAGNOSIS — R0602 Shortness of breath: Secondary | ICD-10-CM | POA: Diagnosis not present

## 2020-10-06 DIAGNOSIS — Z79899 Other long term (current) drug therapy: Secondary | ICD-10-CM | POA: Diagnosis not present

## 2020-10-13 DIAGNOSIS — Z87891 Personal history of nicotine dependence: Secondary | ICD-10-CM | POA: Diagnosis not present

## 2020-10-13 DIAGNOSIS — Z5112 Encounter for antineoplastic immunotherapy: Secondary | ICD-10-CM | POA: Diagnosis not present

## 2020-10-13 DIAGNOSIS — R0602 Shortness of breath: Secondary | ICD-10-CM | POA: Diagnosis not present

## 2020-10-13 DIAGNOSIS — R5381 Other malaise: Secondary | ICD-10-CM | POA: Diagnosis not present

## 2020-10-13 DIAGNOSIS — R591 Generalized enlarged lymph nodes: Secondary | ICD-10-CM | POA: Diagnosis not present

## 2020-10-13 DIAGNOSIS — Z79899 Other long term (current) drug therapy: Secondary | ICD-10-CM | POA: Diagnosis not present

## 2020-10-13 DIAGNOSIS — C3412 Malignant neoplasm of upper lobe, left bronchus or lung: Secondary | ICD-10-CM | POA: Diagnosis not present

## 2020-10-15 ENCOUNTER — Telehealth: Payer: Self-pay | Admitting: *Deleted

## 2020-10-15 ENCOUNTER — Encounter: Payer: Self-pay | Admitting: *Deleted

## 2020-10-15 DIAGNOSIS — R918 Other nonspecific abnormal finding of lung field: Secondary | ICD-10-CM

## 2020-10-15 NOTE — Telephone Encounter (Signed)
I received referral on Tiffany Owen today. I called and scheduled her to be seen next week at [facility].  She verbalized understanding of appt.

## 2020-10-16 ENCOUNTER — Telehealth: Payer: Self-pay | Admitting: *Deleted

## 2020-10-16 ENCOUNTER — Encounter: Payer: Self-pay | Admitting: *Deleted

## 2020-10-16 ENCOUNTER — Ambulatory Visit
Admission: RE | Admit: 2020-10-16 | Discharge: 2020-10-16 | Disposition: A | Discharge status: Home / Self Care | Payer: Self-pay | Source: Ambulatory Visit | Attending: Internal Medicine | Admitting: Internal Medicine

## 2020-10-16 DIAGNOSIS — R918 Other nonspecific abnormal finding of lung field: Secondary | ICD-10-CM

## 2020-10-16 NOTE — Progress Notes (Signed)
I called canopy to request patient scans from [facility] to be pushed to PACS.  I was asked to call [facility]. I called the radiology team at [facility] and updated them on my request.  They will push scans today.

## 2020-10-16 NOTE — Telephone Encounter (Signed)
Daughter called, she is bringing her mother to her appt and needed the address. I update.

## 2020-10-17 DIAGNOSIS — R0902 Hypoxemia: Secondary | ICD-10-CM | POA: Diagnosis not present

## 2020-10-18 ENCOUNTER — Emergency Department (HOSPITAL_COMMUNITY): Payer: BC Managed Care – PPO

## 2020-10-18 ENCOUNTER — Encounter (HOSPITAL_COMMUNITY): Payer: Self-pay | Admitting: Internal Medicine

## 2020-10-18 ENCOUNTER — Inpatient Hospital Stay (HOSPITAL_COMMUNITY)
Admission: EM | Admit: 2020-10-18 | Discharge: 2020-10-20 | DRG: 189 | Disposition: A | Discharge status: Home Health | Payer: BC Managed Care – PPO | Attending: Internal Medicine | Admitting: Internal Medicine

## 2020-10-18 ENCOUNTER — Other Ambulatory Visit: Payer: Self-pay

## 2020-10-18 DIAGNOSIS — Z9981 Dependence on supplemental oxygen: Secondary | ICD-10-CM

## 2020-10-18 DIAGNOSIS — R531 Weakness: Secondary | ICD-10-CM | POA: Diagnosis not present

## 2020-10-18 DIAGNOSIS — J9621 Acute and chronic respiratory failure with hypoxia: Secondary | ICD-10-CM | POA: Diagnosis present

## 2020-10-18 DIAGNOSIS — E119 Type 2 diabetes mellitus without complications: Secondary | ICD-10-CM | POA: Diagnosis not present

## 2020-10-18 DIAGNOSIS — E872 Acidosis: Secondary | ICD-10-CM | POA: Diagnosis not present

## 2020-10-18 DIAGNOSIS — G9341 Metabolic encephalopathy: Secondary | ICD-10-CM | POA: Diagnosis present

## 2020-10-18 DIAGNOSIS — E785 Hyperlipidemia, unspecified: Secondary | ICD-10-CM | POA: Diagnosis present

## 2020-10-18 DIAGNOSIS — C787 Secondary malignant neoplasm of liver and intrahepatic bile duct: Secondary | ICD-10-CM | POA: Diagnosis present

## 2020-10-18 DIAGNOSIS — G4733 Obstructive sleep apnea (adult) (pediatric): Secondary | ICD-10-CM | POA: Diagnosis present

## 2020-10-18 DIAGNOSIS — D72829 Elevated white blood cell count, unspecified: Secondary | ICD-10-CM | POA: Diagnosis present

## 2020-10-18 DIAGNOSIS — R7989 Other specified abnormal findings of blood chemistry: Secondary | ICD-10-CM | POA: Diagnosis not present

## 2020-10-18 DIAGNOSIS — Z515 Encounter for palliative care: Secondary | ICD-10-CM | POA: Diagnosis not present

## 2020-10-18 DIAGNOSIS — R778 Other specified abnormalities of plasma proteins: Secondary | ICD-10-CM | POA: Diagnosis present

## 2020-10-18 DIAGNOSIS — C7800 Secondary malignant neoplasm of unspecified lung: Secondary | ICD-10-CM | POA: Diagnosis not present

## 2020-10-18 DIAGNOSIS — C3490 Malignant neoplasm of unspecified part of unspecified bronchus or lung: Secondary | ICD-10-CM | POA: Diagnosis present

## 2020-10-18 DIAGNOSIS — C3412 Malignant neoplasm of upper lobe, left bronchus or lung: Secondary | ICD-10-CM | POA: Diagnosis not present

## 2020-10-18 DIAGNOSIS — I517 Cardiomegaly: Secondary | ICD-10-CM | POA: Diagnosis not present

## 2020-10-18 DIAGNOSIS — R911 Solitary pulmonary nodule: Secondary | ICD-10-CM | POA: Diagnosis not present

## 2020-10-18 DIAGNOSIS — Z9221 Personal history of antineoplastic chemotherapy: Secondary | ICD-10-CM | POA: Diagnosis not present

## 2020-10-18 DIAGNOSIS — Z7984 Long term (current) use of oral hypoglycemic drugs: Secondary | ICD-10-CM

## 2020-10-18 DIAGNOSIS — I10 Essential (primary) hypertension: Secondary | ICD-10-CM | POA: Diagnosis present

## 2020-10-18 DIAGNOSIS — J449 Chronic obstructive pulmonary disease, unspecified: Secondary | ICD-10-CM | POA: Diagnosis present

## 2020-10-18 DIAGNOSIS — J9602 Acute respiratory failure with hypercapnia: Secondary | ICD-10-CM | POA: Diagnosis present

## 2020-10-18 DIAGNOSIS — J9622 Acute and chronic respiratory failure with hypercapnia: Secondary | ICD-10-CM | POA: Diagnosis not present

## 2020-10-18 DIAGNOSIS — R52 Pain, unspecified: Secondary | ICD-10-CM | POA: Diagnosis not present

## 2020-10-18 DIAGNOSIS — Z20822 Contact with and (suspected) exposure to covid-19: Secondary | ICD-10-CM | POA: Diagnosis present

## 2020-10-18 DIAGNOSIS — R0602 Shortness of breath: Secondary | ICD-10-CM

## 2020-10-18 DIAGNOSIS — Z7189 Other specified counseling: Secondary | ICD-10-CM | POA: Diagnosis not present

## 2020-10-18 DIAGNOSIS — J9811 Atelectasis: Secondary | ICD-10-CM | POA: Diagnosis not present

## 2020-10-18 DIAGNOSIS — C7951 Secondary malignant neoplasm of bone: Secondary | ICD-10-CM | POA: Diagnosis not present

## 2020-10-18 DIAGNOSIS — Z23 Encounter for immunization: Secondary | ICD-10-CM | POA: Diagnosis not present

## 2020-10-18 DIAGNOSIS — Z9119 Patient's noncompliance with other medical treatment and regimen: Secondary | ICD-10-CM

## 2020-10-18 DIAGNOSIS — Z6841 Body Mass Index (BMI) 40.0 and over, adult: Secondary | ICD-10-CM

## 2020-10-18 DIAGNOSIS — M549 Dorsalgia, unspecified: Secondary | ICD-10-CM | POA: Diagnosis not present

## 2020-10-18 DIAGNOSIS — J984 Other disorders of lung: Secondary | ICD-10-CM | POA: Diagnosis present

## 2020-10-18 DIAGNOSIS — G893 Neoplasm related pain (acute) (chronic): Secondary | ICD-10-CM | POA: Diagnosis present

## 2020-10-18 DIAGNOSIS — T402X5A Adverse effect of other opioids, initial encounter: Secondary | ICD-10-CM | POA: Diagnosis present

## 2020-10-18 DIAGNOSIS — J9612 Chronic respiratory failure with hypercapnia: Secondary | ICD-10-CM | POA: Diagnosis present

## 2020-10-18 DIAGNOSIS — Z87891 Personal history of nicotine dependence: Secondary | ICD-10-CM

## 2020-10-18 DIAGNOSIS — Z79899 Other long term (current) drug therapy: Secondary | ICD-10-CM

## 2020-10-18 DIAGNOSIS — R4182 Altered mental status, unspecified: Secondary | ICD-10-CM | POA: Diagnosis not present

## 2020-10-18 DIAGNOSIS — J9620 Acute and chronic respiratory failure, unspecified whether with hypoxia or hypercapnia: Secondary | ICD-10-CM | POA: Diagnosis not present

## 2020-10-18 HISTORY — DX: Malignant (primary) neoplasm, unspecified: C80.1

## 2020-10-18 HISTORY — DX: Chronic obstructive pulmonary disease, unspecified: J44.9

## 2020-10-18 LAB — I-STAT ARTERIAL BLOOD GAS, ED
Acid-Base Excess: 11 mmol/L — ABNORMAL HIGH (ref 0.0–2.0)
Bicarbonate: 42.1 mmol/L — ABNORMAL HIGH (ref 20.0–28.0)
Calcium, Ion: 1.31 mmol/L (ref 1.15–1.40)
HCT: 44 % (ref 36.0–46.0)
Hemoglobin: 15 g/dL (ref 12.0–15.0)
O2 Saturation: 88 %
Patient temperature: 97.8
Potassium: 4.3 mmol/L (ref 3.5–5.1)
Sodium: 139 mmol/L (ref 135–145)
TCO2: 45 mmol/L — ABNORMAL HIGH (ref 22–32)
pCO2 arterial: 87.3 mmHg (ref 32.0–48.0)
pH, Arterial: 7.29 — ABNORMAL LOW (ref 7.350–7.450)
pO2, Arterial: 63 mmHg — ABNORMAL LOW (ref 83.0–108.0)

## 2020-10-18 LAB — BASIC METABOLIC PANEL
Anion gap: 10 (ref 5–15)
BUN: 18 mg/dL (ref 8–23)
CO2: 35 mmol/L — ABNORMAL HIGH (ref 22–32)
Calcium: 9.1 mg/dL (ref 8.9–10.3)
Chloride: 95 mmol/L — ABNORMAL LOW (ref 98–111)
Creatinine, Ser: 0.91 mg/dL (ref 0.44–1.00)
GFR, Estimated: >60 mL/min (ref >60)
Glucose, Bld: 164 mg/dL — ABNORMAL HIGH (ref 70–99)
Potassium: 4.2 mmol/L (ref 3.5–5.1)
Sodium: 140 mmol/L (ref 135–145)

## 2020-10-18 LAB — LIPID PANEL
Cholesterol: 93 mg/dL (ref 0–200)
HDL: 27 mg/dL — ABNORMAL LOW (ref >40)
LDL Cholesterol: 50 mg/dL (ref 0–99)
Total CHOL/HDL Ratio: 3.4 RATIO
Triglycerides: 80 mg/dL (ref <150)
VLDL: 16 mg/dL (ref 0–40)

## 2020-10-18 LAB — CBC
HCT: 46.8 % — ABNORMAL HIGH (ref 36.0–46.0)
Hemoglobin: 14.7 g/dL (ref 12.0–15.0)
MCH: 29.8 pg (ref 26.0–34.0)
MCHC: 31.4 g/dL (ref 30.0–36.0)
MCV: 94.9 fL (ref 80.0–100.0)
Platelets: 206 10*3/uL (ref 150–400)
RBC: 4.93 MIL/uL (ref 3.87–5.11)
RDW: 13.7 % (ref 11.5–15.5)
WBC: 19.6 10*3/uL — ABNORMAL HIGH (ref 4.0–10.5)
nRBC: 0 % (ref 0.0–0.2)

## 2020-10-18 LAB — CREATININE, SERUM
Creatinine, Ser: 0.75 mg/dL (ref 0.44–1.00)
GFR, Estimated: >60 mL/min (ref >60)

## 2020-10-18 LAB — PROTIME-INR
INR: 1.2 (ref 0.8–1.2)
Prothrombin Time: 14.7 seconds (ref 11.4–15.2)

## 2020-10-18 LAB — APTT: aPTT: 24 seconds (ref 24–36)

## 2020-10-18 LAB — HEMOGLOBIN A1C
Hgb A1c MFr Bld: 6.5 % — ABNORMAL HIGH (ref 4.8–5.6)
Mean Plasma Glucose: 139.85 mg/dL

## 2020-10-18 LAB — RESP PANEL BY RT-PCR (FLU A&B, COVID) ARPGX2
Influenza A by PCR: NEGATIVE
Influenza B by PCR: NEGATIVE
SARS Coronavirus 2 by RT PCR: NEGATIVE

## 2020-10-18 LAB — BRAIN NATRIURETIC PEPTIDE: B Natriuretic Peptide: 793.3 pg/mL — ABNORMAL HIGH (ref 0.0–100.0)

## 2020-10-18 LAB — TROPONIN I (HIGH SENSITIVITY)
Troponin I (High Sensitivity): 44 ng/L — ABNORMAL HIGH (ref <18)
Troponin I (High Sensitivity): 69 ng/L — ABNORMAL HIGH (ref <18)
Troponin I (High Sensitivity): 42 ng/L — ABNORMAL HIGH (ref <18)
Troponin I (High Sensitivity): 30 ng/L — ABNORMAL HIGH (ref <18)

## 2020-10-18 LAB — D-DIMER, QUANTITATIVE: D-Dimer, Quant: 7.42 ug/mL-FEU — ABNORMAL HIGH (ref 0.00–0.50)

## 2020-10-18 MED ORDER — AZITHROMYCIN 500 MG PO TABS
250.0000 mg | ORAL_TABLET | Freq: Once | ORAL | Status: AC
  Administered 2020-10-18: 250 mg via ORAL
  Filled 2020-10-18: qty 1

## 2020-10-18 MED ORDER — SODIUM CHLORIDE 0.9% FLUSH
3.0000 mL | Freq: Two times a day (BID) | INTRAVENOUS | Status: DC
  Administered 2020-10-18 – 2020-10-20 (×4): 3 mL via INTRAVENOUS

## 2020-10-18 MED ORDER — ACETAMINOPHEN 650 MG RE SUPP: 650.0000 mg | Freq: Four times a day (QID) | RECTAL | Status: DC | PRN

## 2020-10-18 MED ORDER — SODIUM CHLORIDE 0.9 % IV SOLN: 250.0000 mL | INTRAVENOUS | Status: DC | PRN

## 2020-10-18 MED ORDER — IOHEXOL 350 MG/ML SOLN
100.0000 mL | Freq: Once | INTRAVENOUS | Status: AC | PRN
  Administered 2020-10-18: 100 mL via INTRAVENOUS

## 2020-10-18 MED ORDER — PROCHLORPERAZINE MALEATE 10 MG PO TABS
10.0000 mg | ORAL_TABLET | Freq: Four times a day (QID) | ORAL | Status: DC | PRN
  Filled 2020-10-18: qty 1

## 2020-10-18 MED ORDER — SODIUM CHLORIDE 0.9% FLUSH: 3.0000 mL | INTRAVENOUS | Status: DC | PRN

## 2020-10-18 MED ORDER — IPRATROPIUM-ALBUTEROL 0.5-2.5 (3) MG/3ML IN SOLN: 3.0000 mL | Freq: Four times a day (QID) | RESPIRATORY_TRACT | Status: DC | PRN

## 2020-10-18 MED ORDER — MORPHINE SULFATE (PF) 4 MG/ML IV SOLN
4.0000 mg | Freq: Once | INTRAVENOUS | Status: AC
Start: 2020-10-18 — End: 2020-10-18
  Administered 2020-10-18: 4 mg via INTRAVENOUS
  Filled 2020-10-18: qty 1

## 2020-10-18 MED ORDER — INSULIN ASPART 100 UNIT/ML IJ SOLN: 0.0000 [IU] | Freq: Three times a day (TID) | INTRAMUSCULAR | Status: DC

## 2020-10-18 MED ORDER — IRBESARTAN 75 MG PO TABS
75.0000 mg | ORAL_TABLET | Freq: Every day | ORAL | Status: DC
  Administered 2020-10-18 – 2020-10-20 (×3): 75 mg via ORAL
  Filled 2020-10-18 (×3): qty 1

## 2020-10-18 MED ORDER — CARVEDILOL 12.5 MG PO TABS
12.5000 mg | ORAL_TABLET | Freq: Two times a day (BID) | ORAL | Status: DC
  Administered 2020-10-18 – 2020-10-20 (×5): 12.5 mg via ORAL
  Filled 2020-10-18 (×5): qty 1

## 2020-10-18 MED ORDER — ENOXAPARIN SODIUM 40 MG/0.4ML IJ SOSY
40.0000 mg | PREFILLED_SYRINGE | INTRAMUSCULAR | Status: DC
  Administered 2020-10-18 – 2020-10-19 (×2): 40 mg via SUBCUTANEOUS
  Filled 2020-10-18 (×3): qty 0.4

## 2020-10-18 MED ORDER — ACETAMINOPHEN 325 MG PO TABS
650.0000 mg | ORAL_TABLET | Freq: Four times a day (QID) | ORAL | Status: DC | PRN
  Administered 2020-10-18 – 2020-10-19 (×2): 650 mg via ORAL
  Filled 2020-10-18 (×2): qty 2

## 2020-10-18 MED ORDER — POLYETHYLENE GLYCOL 3350 17 G PO PACK: 17.0000 g | PACK | Freq: Every day | ORAL | Status: DC | PRN

## 2020-10-18 MED ORDER — HYDROCHLOROTHIAZIDE 25 MG PO TABS
25.0000 mg | ORAL_TABLET | Freq: Every day | ORAL | Status: DC
  Administered 2020-10-18 – 2020-10-20 (×3): 25 mg via ORAL
  Filled 2020-10-18 (×3): qty 1

## 2020-10-18 NOTE — ED Triage Notes (Signed)
Patient with history of Lung CA, found to have oxygen sats in the 50's upon arrival to ED.  Patient placed on NRB at 15L, oxygen sats increased to mid 90's.  Patient is having back pain with the shortness of breath.  The shortness of breath started a few days ago.  She denies any covid exposures.

## 2020-10-18 NOTE — ED Provider Notes (Signed)
  Physical Exam  BP 127/90   Pulse 98   Temp 97.8 F (36.6 C) (Oral)   Resp (!) 23   SpO2 94%   Physical Exam  ED Course/Procedures   Clinical Course as of 10/18/20 1519  Sun Oct 18, 2020  0747 Patient's medical record marked for merge with MRN: [id number]. Per records in that MRN she was diagnosed with lung cancer in the last couple of months, started chemo in [place], [place] but has moved to live in [place] with her daughter and is planning to continue her care here. She is scheduled to see local Oncology next week.  [CS]  3329 CBC with leukocytosis.  [CS]  5188 BMP is unremarkable. BNP and Trop are both mildly elevated. CXR without significant pulm edema [CS]  0949 Dimer is elevated, will send for CTA to rule out PE. Covid swab also ordered.  [CS]  4166 CTA neg for PE, Trop is increasing. Will discuss admission with Hospitalist.  [CS]  1208 Spoke with Dr. Jamse Arn, Hospitalist, who would like for the patient to try walking with her usual home oxygen. She does not feel that the elevated Troponin is sufficient indication for admission.  [CS]  1327 Patient seen by Dr. Jamse Arn, who is concerned about her somnolence. It may be from Morphine she received earlier but will send for CT head to rule out intracranial process. No known brain mets. Patient is unable to stand due to weakness.  [CS]  0630 Covid is neg.  [CS]  1601 CT head is neg. Will discuss disposition with Dr. Jamse Arn.  [CS]  1410 Spoke again with Dr. Jamse Arn who feels the patient's weakness is due to morphine she received earlier and does not see any other indication for admission. She suggests continued ED management until able to stand and walk. Recommends increasing oxygen at home. Outpatient oncology follow up already scheduled this week.  [CS]  0932 Care of the patient will be signed out to Dr. Joya Gaskins at the change of shift. Will add a third Trop, UA and Blood gas to further evaluate the patient's presenting  complaints.  [CS]  1501 I spoke with Dr. Terrence Dupont of cards who will see the patient. [AW]  1516 Dr. Jamse Arn thinks symptoms are still consistent with morphine administration and has agreed to admit given the patient's clinical deterioration.  [AW]    Clinical Course User Index [AW] Arnaldo Natal, MD [CS] Truddie Hidden, MD    Procedures  MDM  Tiffany Owen presented to the ED with severe hypoxia likely secondary to not being on her home oxygen.  She was evaluated for other acute process and was found to have an elevated white blood cell count of 19.6 without obvious source of infection.  She had up trending troponins and an elevated B natruretic peptide.  No history of CHF.  Unsure whether this is demand ischemia or related to a primary cardiology problem.  Dr. Terrence Dupont will weigh in on this patient.  While she did receive morphine, she was also found to be more sedated than would be expected.  Renal function is normal, and I do not expect her to have delayed metabolism of this drug.  ABG revealed a respiratory acidosis, and the patient was started on BiPAP.  She will be admitted for symptomatic management and any further treatment as necessary.       Arnaldo Natal, MD 10/18/20 [phone]

## 2020-10-18 NOTE — Consult Note (Signed)
Reason for Consult: Mildly elevated BNP/minimally elevated high-sensitivity troponin I Referring Physician: EDP  Tiffany Owen is an 60 y.o. female.  HPI: Patient is 60 year old female with past medical history significant for recently diagnosed metastatic lung carcinoma resident of [facility] status post recent chemotherapy moved to [place] to her daughter, chronic respiratory failure on home O2, hypertension, hyperlipidemia, morbidly obese came to ER by her daughter because of progressive increasing shortness of breath and back pain patient had CT of the chest which showed new bony mets which corresponds patient's back pain.  Patient received IV morphine and subsequently became lethargic requiring BiPAP.  Patient is presently awake on BiPAP maintaining sats 98%.  Patient is more alert and awake now but remains on BiPAP.  Patient denies any chest pain or shortness of breath.  Denies history of anginal chest pain in the past.  Denies history of PND orthopnea or leg swelling.  Denies history of MI in the past EKG done in the ED showed sinus tachycardia with poor R wave progression in the anterior leads of note patient is morbidly obese.  Patient denies any cough fever chills.  Patient has appointment to see oncology early next week as outpatient.  No past medical history on file.    No family history on file.  Social History:  has no history on file for tobacco use, alcohol use, and drug use.  Allergies: No Known Allergies  Medications: I have reviewed the patient's current medications.  Results for orders placed or performed during the hospital encounter of 10/18/20 (from the past 48 hour(s))  Basic metabolic panel     Status: Abnormal   Collection Time: 10/18/20  7:28 AM  Result Value Ref Range   Sodium 140 135 - 145 mmol/L   Potassium 4.2 3.5 - 5.1 mmol/L   Chloride 95 (L) 98 - 111 mmol/L   CO2 35 (H) 22 - 32 mmol/L   Glucose, Bld 164 (H) 70 - 99 mg/dL    Comment: Glucose  reference range applies only to samples taken after fasting for at least 8 hours.   BUN 18 8 - 23 mg/dL   Creatinine, Ser 0.91 0.44 - 1.00 mg/dL   Calcium 9.1 8.9 - 10.3 mg/dL   GFR, Estimated >60 >60 mL/min    Comment: (NOTE) Calculated using the CKD-EPI Creatinine Equation (2021)    Anion gap 10 5 - 15    Comment: Performed at [facility] [address], [place], [place] [address]  CBC     Status: Abnormal   Collection Time: 10/18/20  7:28 AM  Result Value Ref Range   WBC 19.6 (H) 4.0 - 10.5 K/uL   RBC 4.93 3.87 - 5.11 MIL/uL   Hemoglobin 14.7 12.0 - 15.0 g/dL   HCT 46.8 (H) 36.0 - 46.0 %   MCV 94.9 80.0 - 100.0 fL   MCH 29.8 26.0 - 34.0 pg   MCHC 31.4 30.0 - 36.0 g/dL   RDW 13.7 11.5 - 15.5 %   Platelets 206 150 - 400 K/uL   nRBC 0.0 0.0 - 0.2 %    Comment: Performed at [facility], [place] [address] [place], [place] [address]  Troponin I (High Sensitivity)     Status: Abnormal   Collection Time: 10/18/20  7:28 AM  Result Value Ref Range   Troponin I (High Sensitivity) 44 (H) <18 ng/L    Comment: (NOTE) Elevated high sensitivity troponin I (hsTnI) values and significant  changes across serial  measurements may suggest ACS but many other  chronic and acute conditions are known to elevate hsTnI results.  Refer to the "Links" section for chest pain algorithms and additional  guidance. Performed at [facility], [place] [address], [place], [place] [address]   Brain natriuretic peptide     Status: Abnormal   Collection Time: 10/18/20  7:29 AM  Result Value Ref Range   B Natriuretic Peptide 793.3 (H) 0.0 - 100.0 pg/mL    Comment: Performed at [facility] [address] [place], [place] [address]  D-dimer, quantitative     Status: Abnormal   Collection Time: 10/18/20  7:29 AM  Result Value Ref Range   D-Dimer, Quant 7.42 (H) 0.00 - 0.50 ug/mL-FEU    Comment: (NOTE) At the manufacturer cut-off value of 0.5 g/mL FEU, this assay has a negative  predictive value of 95-100%.This assay is intended for use in conjunction with a clinical pretest probability (PTP) assessment model to exclude pulmonary embolism (PE) and deep venous thrombosis (DVT) in outpatients suspected of PE or DVT. Results should be correlated with clinical presentation. Performed at [facility], [place] [address], [place], [place] [address]   Troponin I (High Sensitivity)     Status: Abnormal   Collection Time: 10/18/20 10:08 AM  Result Value Ref Range   Troponin I (High Sensitivity) 69 (H) <18 ng/L    Comment: RESULT CALLED TO, READ BACK BY AND VERIFIED WITH: K.NEWNAM RN @ 8299 10/18/2020 BY C.EDENS (NOTE) Elevated high sensitivity troponin I (hsTnI) values and significant  changes across serial measurements may suggest ACS but many other  chronic and acute conditions are known to elevate hsTnI results.  Refer to the Links section for chest pain algorithms and additional  guidance. Performed at [facility], [place] [address] [place],  [address]   Resp Panel by RT-PCR (Flu A&B, Covid) Nasopharyngeal Swab     Status: None   Collection Time: 10/18/20 11:20 AM   Specimen: Nasopharyngeal Swab; Nasopharyngeal(NP) swabs in vial transport medium  Result Value Ref Range   SARS Coronavirus 2 by RT PCR NEGATIVE NEGATIVE    Comment: (NOTE) SARS-CoV-2 target nucleic acids are NOT DETECTED.  The SARS-CoV-2 RNA is generally detectable in upper respiratory specimens during the acute phase of infection. The lowest concentration of SARS-CoV-2 viral copies this assay can detect is 138 copies/mL. A negative result does not preclude SARS-Cov-2 infection and should not be used as the sole basis for treatment or other patient management decisions. A negative result may occur with  improper specimen collection/handling, submission of specimen other than nasopharyngeal swab, presence of viral mutation(s) within the areas targeted by this assay, and inadequate  number of viral copies(<138 copies/mL). A negative result must be combined with clinical observations, patient history, and epidemiological information. The expected result is Negative.  Fact Sheet for Patients:  EntrepreneurPulse.com.au  Fact Sheet for Healthcare Providers:  IncredibleEmployment.be  This test is no t yet approved or cleared by the [place] FDA and  has been authorized for detection and/or diagnosis of SARS-CoV-2 by FDA under an Emergency Use Authorization (EUA). This EUA will remain  in effect (meaning this test can be used) for the duration of the COVID-19 declaration under Section 564(b)(1) of the Act, 21 U.S.C.section 360bbb-3(b)(1), unless the authorization is terminated  or revoked sooner.       Influenza A by PCR NEGATIVE NEGATIVE   Influenza B by PCR NEGATIVE NEGATIVE  Comment: (NOTE) The Xpert Xpress SARS-CoV-2/FLU/RSV plus assay is intended as an aid in the diagnosis of influenza from Nasopharyngeal swab specimens and should not be used as a sole basis for treatment. Nasal washings and aspirates are unacceptable for Xpert Xpress SARS-CoV-2/FLU/RSV testing.  Fact Sheet for Patients: EntrepreneurPulse.com.au  Fact Sheet for Healthcare Providers: IncredibleEmployment.be  This test is not yet approved or cleared by the [place] FDA and has been authorized for detection and/or diagnosis of SARS-CoV-2 by FDA under an Emergency Use Authorization (EUA). This EUA will remain in effect (meaning this test can be used) for the duration of the COVID-19 declaration under Section 564(b)(1) of the Act, 21 U.S.C. section 360bbb-3(b)(1), unless the authorization is terminated or revoked.  Performed at [facility], [place] [address] [place], [place] [address]   APTT     Status: None   Collection Time: 10/18/20 11:26 AM  Result Value Ref Range   aPTT 24 24 - 36 seconds     Comment: Performed at [facility] [address] [place], [place] [address]  Protime-INR     Status: None   Collection Time: 10/18/20 11:26 AM  Result Value Ref Range   Prothrombin Time 14.7 11.4 - 15.2 seconds   INR 1.2 0.8 - 1.2    Comment: (NOTE) INR goal varies based on device and disease states. Performed at [facility], [place] [address] [place], [place] [address]   I-Stat arterial blood gas, ED     Status: Abnormal   Collection Time: 10/18/20  2:38 PM  Result Value Ref Range   pH, Arterial 7.290 (L) 7.350 - 7.450   pCO2 arterial 87.3 (HH) 32.0 - 48.0 mmHg   pO2, Arterial 63 (L) 83.0 - 108.0 mmHg   Bicarbonate 42.1 (H) 20.0 - 28.0 mmol/L   TCO2 45 (H) 22 - 32 mmol/L   O2 Saturation 88.0 %   Acid-Base Excess 11.0 (H) 0.0 - 2.0 mmol/L   Sodium 139 135 - 145 mmol/L   Potassium 4.3 3.5 - 5.1 mmol/L   Calcium, Ion 1.31 1.15 - 1.40 mmol/L   HCT 44.0 36.0 - 46.0 %   Hemoglobin 15.0 12.0 - 15.0 g/dL   Patient temperature 97.8 F    Collection site Radial    Drawn by RT    Sample type ARTERIAL    Comment NOTIFIED PHYSICIAN     CT Head Wo Contrast  Result Date: 10/18/2020 CLINICAL DATA:  Mental status changes. EXAM: CT HEAD WITHOUT CONTRAST TECHNIQUE: Contiguous axial images were obtained from the base of the skull through the vertex without intravenous contrast. COMPARISON:  None. FINDINGS: Brain: There is no evidence for acute hemorrhage, hydrocephalus, mass lesion, or abnormal extra-axial fluid collection. No definite CT evidence for acute infarction. Intravascular contrast evident secondary to recent CTA Chest, limiting assessment for subtle subarachnoid hemorrhage. Patchy low attenuation in the deep hemispheric and periventricular white matter is nonspecific, but likely reflects chronic microvascular ischemic demyelination. Vascular: Persistent enhancement of blood pool from recent CTA Chest . Skull: No evidence for fracture. No worrisome lytic or sclerotic lesion.  Sinuses/Orbits: The visualized paranasal sinuses and mastoid air cells are clear. Visualized portions of the globes and intraorbital fat are unremarkable. Other: None. IMPRESSION: 1. No acute intracranial abnormality. 2. Atrophy with chronic small vessel white matter ischemic disease. Electronically Signed   By: Misty Stanley M.D.   On: 10/18/2020 13:27   CT Angio Chest PE W/Cm &/Or Wo Cm  Result Date:  10/18/2020 CLINICAL DATA:  60 year old female with shortness of breath and elevated D-dimer. EXAM: CT ANGIOGRAPHY CHEST WITH CONTRAST TECHNIQUE: Multidetector CT imaging of the chest was performed using the standard protocol during bolus administration of intravenous contrast. Multiplanar CT image reconstructions and MIPs were obtained to evaluate the vascular anatomy. CONTRAST:  155mL OMNIPAQUE IOHEXOL 350 MG/ML SOLN COMPARISON:  04/15/2018 CT.  10/18/2020 prior radiographs FINDINGS: Cardiovascular: Satisfactory opacification of the pulmonary arteries to the segmental level. No evidence of pulmonary embolism. Cardiomegaly is noted. No thoracic aortic aneurysm or pericardial effusion identified. Mediastinum/Nodes: Enlarged LEFT hilar nodes are identified with a 1.7 cm index node (series 6: Image 47). Other UPPER limits of normal sized mediastinal lymph nodes are identified. The thyroid gland, trachea and esophagus are unremarkable. Lungs/Pleura: A 3 x 3.6 x 3.9 cm mass versus masslike consolidation is noted within the posteromedial aspect of the LEFT LOWER lobe (8:50). Multiple primarily subcentimeter bilateral pulmonary nodules are identified, many which are new from 04/15/2018. Index nodules include a 1.4 x 1.4 x 2 cm LEFT apical nodule (8:15), a 8 mm LEFT UPPER lobe nodule (8:23) and 6 mm LEFT LOWER lobe nodule (8:62). Scattered areas of atelectasis within both lungs are identified. No pleural effusion or pneumothorax identified. Upper Abdomen: A nonspecific 1.8 cm hypodense splenic lesion (6:80) is noted.  Probable hepatic steatosis identified. Musculoskeletal: New 3 mm lytic lesions within the LEFT 5th rib and LEFT 8th rib are identified) 8:35 and 8:60. A 6 mm area of sclerosis within the anterior RIGHT 5th rib is noted. No other new bony abnormalities are identified. Review of the MIP images confirms the above findings. IMPRESSION: 1. 3 x 3.6 x 3.9 cm mass versus masslike consolidation within the posteromedial LEFT LOWER lobe - suspicious for malignancy. Multiple bilateral pulmonary nodules and enlarged LEFT hilar lymph nodes worrisome for metastatic disease. 2. New 3 mm lytic lesions within the LEFT 5th rib and LEFT 8th rib - suspicious for metastatic disease. Indeterminate 6 mm sclerosis within the RIGHT 5th rib. 3. Indeterminate 1.8 cm splenic lesion. Consider elective MRI for further evaluation. 4. No evidence of pulmonary emboli or thoracic aortic aneurysm. 5. Cardiomegaly. 6. Probable hepatic steatosis. Electronically Signed   By: Margarette Canada M.D.   On: 10/18/2020 11:18   DG Chest Portable 1 View  Result Date: 10/18/2020 CLINICAL DATA:  Shortness of breath. EXAM: PORTABLE CHEST 1 VIEW COMPARISON:  None. FINDINGS: Mild cardiomegaly is noted. Mild left midlung subsegmental atelectasis is noted. No pneumothorax or significant pleural effusion is noted. Right lung is clear. Bony thorax is unremarkable. IMPRESSION: Mild left midlung subsegmental atelectasis. Electronically Signed   By: Marijo Conception M.D.   On: 10/18/2020 08:13    Review of Systems  Constitutional:  Negative for diaphoresis and fever.  HENT:  Negative for sore throat.   Eyes:  Negative for discharge.  Respiratory:  Positive for shortness of breath.   Cardiovascular:  Negative for chest pain, palpitations and leg swelling.  Gastrointestinal:  Positive for abdominal distention. Negative for abdominal pain.  Genitourinary:  Negative for difficulty urinating.  Neurological:  Negative for dizziness.  Blood pressure 127/90, pulse 98,  temperature 97.8 F (36.6 C), temperature source Oral, resp. rate (!) 23, SpO2 94 %. Physical Exam Constitutional:      Appearance: She is well-developed. She is obese.  HENT:     Head: Normocephalic and atraumatic.  Eyes:     Extraocular Movements: Extraocular movements intact.     Pupils: Pupils are equal,  round, and reactive to light.  Neck:     Vascular: No JVD.  Cardiovascular:     Comments: Tachycardic S1-S2 soft Pulmonary:     Comments: Decreased breath sound at bases no rails appreciated Abdominal:     General: Bowel sounds are normal.     Palpations: Abdomen is soft.  Musculoskeletal:     Cervical back: Neck supple.     Comments: No clubbing cyanosis or edema  Skin:    General: Skin is warm and dry.  Neurological:     General: No focal deficit present.     Mental Status: She is alert.    Assessment/Plan: Acute on chronic hypoxic hypercarbic respiratory failure precipitated by IV morphine Possible mild acute congestive heart failure Minimally elevated high-sensitivity troponin secondary to above doubt significant MI Metastatic lung carcinoma with new mets to ribs now Hypertension Hyperlipidemia History of tobacco abuse Morbid obesity Probably obstructive sleep apnea Plan Will check serial enzymes, lipid panel and EKG Will get 2D echo check LV systolic/diastolic function and wall motion abnormalities Wean off BiPAP as tolerated Avoid narcotics  Charolette Forward 10/18/2020, 3:30 PM

## 2020-10-18 NOTE — ED Notes (Signed)
ED Provider at bedside. 

## 2020-10-18 NOTE — ED Notes (Signed)
Pt comfortable sitting up in the bed talking with family. BiPAP in place. Instructed family not to remove mask or give pt anything PO at this time.

## 2020-10-18 NOTE — ED Notes (Signed)
Dr. Karle Starch notified of Trop increase.

## 2020-10-18 NOTE — H&P (Signed)
History and Physical:    Elinda Bunten   JQB:[id number] DOB: 1960-05-01 DOA: 10/18/2020  Referring MD/provider: Drs Karle Starch and Joya Gaskins PCP: Lujean Amel, MD   Patient coming from: Home  Chief Complaint: Respiratory decompensation after morphine administration  History of Present Illness:   Aislinn Feliz is an 60 y.o. female Lundon Rosier is an 60 y.o. female with chronic hypoxic respiratory failure secondary to COPD and metastatic lung ca who is brought in by daughter for increasing shortness of breath and back pain.  When patient presented she did not have her oxygen on and her O2 saturations were in the 50s.  Patient was placed on her usual 3 L of oxygen's with marked improvement in oxygenation to the mid to high 90s.  Patient underwent a CT angiogram which showed significant progression of disease but no new infiltrates, vascular congestion or any other acute process.  Patient was also noted to have new bony mets which corresponded to patient's area of back pain.  Patient was called in for admissions because of initial troponin of 44 with 79 on repeat.   2 PM: When I went to see patient, she was lethargic and sleepy.  She stated that she was not short of breath.  She denied any chest pain.  Patient denied confusion but said that she was not sure why she was so sleepy.  Of note patient had been given morphine 4 mg IV for her back pain.  Patient states she had not been sleepy when she came in this morning but her shortness of breath was her concern.   Patient denies recent fevers or chills.  No change in baseline cough.  No abdominal pain.  No diarrhea.  No nausea or vomiting.  No dysuria.  4 PM: Called to reassess patient because she had decreased respiratory rate and noted to have increased CO2 at 87 with respiratory acidosis.  Patient had been placed on BiPAP.  Tried hospitalist called to admit.     ROS:   ROS   Review of Systems: Per HPI  Past Medical History:   No past medical  history on file.  Past Surgical History:    Social History:   Social History   Socioeconomic History   Marital status: Single    Spouse name: Not on file   Number of children: Not on file   Years of education: Not on file   Highest education level: Not on file  Occupational History   Not on file  Tobacco Use   Smoking status: Not on file   Smokeless tobacco: Not on file  Substance and Sexual Activity   Alcohol use: Not on file   Drug use: Not on file   Sexual activity: Not on file  Other Topics Concern   Not on file  Social History Narrative   Not on file   Social Determinants of Health   Financial Resource Strain: Not on file  Food Insecurity: Not on file  Transportation Needs: Not on file  Physical Activity: Not on file  Stress: Not on file  Social Connections: Not on file  Intimate Partner Violence: Not on file    Allergies   Patient has no known allergies.  Family history:   No family history on file.  Current Medications:   Prior to Admission medications   Medication Sig Start Date End Date Taking? Authorizing Provider  amoxicillin-clavulanate (AUGMENTIN) 875-125 MG tablet Take 1 tablet by mouth 2 (two) times daily.   Yes [provider]  azithromycin (ZITHROMAX) 250 MG tablet Take 250-500 mg by mouth See admin instructions. Take 500 mg by mouth on day one, then decrease to 250 mg once a day on days 2-5   Yes [provider]  benzonatate (TESSALON) 200 MG capsule Take 200 mg by mouth 3 (three) times daily as needed for cough.   Yes [provider]  carvedilol (COREG) 12.5 MG tablet Take 12.5 mg by mouth 2 (two) times daily with a meal.   Yes [provider]  Cholecalciferol (VITAMIN D3 PO) Take 1 capsule by mouth daily.   Yes [provider]  Cyanocobalamin (VITAMIN B-12 PO) Take 1 tablet by mouth daily.   Yes [provider]  folic acid (FOLVITE) 1 MG tablet Take 1 mg by mouth See admin instructions.  Take 1 mg daily by mouth starting 7 days before and continuing for 3 weeks after last chemo   Yes [provider]  hydrochlorothiazide (HYDRODIURIL) 25 MG tablet Take 25 mg by mouth daily.   Yes [provider]  HYDROcodone-acetaminophen (NORCO/VICODIN) 5-325 MG tablet Take 1 tablet by mouth every 6 (six) hours as needed for moderate pain.   Yes [provider]  ipratropium-albuterol (DUONEB) 0.5-2.5 (3) MG/3ML SOLN Take 3 mLs by nebulization every 6 (six) hours as needed (for shortness of breath or wheezing).   Yes [provider]  irbesartan (AVAPRO) 75 MG tablet Take 75 mg by mouth daily.   Yes [provider]  metFORMIN (GLUCOPHAGE-XR) 500 MG 24 hr tablet Take 500 mg by mouth daily with breakfast.   Yes [provider]  Multiple Vitamins-Minerals (ONE-A-DAY WOMENS PO) Take 1 tablet by mouth daily with breakfast.   Yes [provider]  prochlorperazine (COMPAZINE) 10 MG tablet Take 10 mg by mouth every 6 (six) hours as needed for nausea or vomiting.   Yes [provider]    Physical Exam:   Vitals:   10/18/20 1454 10/18/20 1500 10/18/20 1515 10/18/20 1530  BP: 127/90 128/78 128/90 120/80  Pulse: 98 96 96 (!) 103  Resp: (!) 23 (!) 22 (!) 22 (!) 22  Temp:      TempSrc:      SpO2: 94% 97% 96% 96%     Physical Exam: Blood pressure 120/80, pulse (!) 103, temperature 97.8 F (36.6 C), temperature source Oral, resp. rate (!) 22, SpO2 96 %. Gen: Obese patient lying in bed with BiPAP on place, she is able to speak clearly and laughs when I complemented her on her blue toenails.  She is in good spirits. Eyes: sclera anicteric, conjuctiva mildly injected bilaterally CVS: Distant heart sound Respiratory: Mechanical breath sounds in all lung fields GI: Very large abdomen, NABS, firm but not hard, NT  LE: No edema. No cyanosis Neuro: grossly nonfocal.    Data Review:    Labs: Basic Metabolic Panel: Recent Labs  Lab  10/18/20 0728 10/18/20 1438  NA 140 139  K 4.2 4.3  CL 95*  --   CO2 35*  --   GLUCOSE 164*  --   BUN 18  --   CREATININE 0.91  --   CALCIUM 9.1  --    Liver Function Tests: No results for input(s): AST, ALT, ALKPHOS, BILITOT, PROT, ALBUMIN in the last 168 hours. No results for input(s): LIPASE, AMYLASE in the last 168 hours. No results for input(s): AMMONIA in the last 168 hours. CBC: Recent Labs  Lab 10/18/20 0728 10/18/20 1438  WBC 19.6*  --  HGB 14.7 15.0  HCT 46.8* 44.0  MCV 94.9  --   PLT 206  --    Cardiac Enzymes: No results for input(s): CKTOTAL, CKMB, CKMBINDEX, TROPONINI in the last 168 hours.  BNP (last 3 results) No results for input(s): PROBNP in the last 8760 hours. CBG: No results for input(s): GLUCAP in the last 168 hours.  Urinalysis No results found for: COLORURINE, APPEARANCEUR, LABSPEC, PHURINE, GLUCOSEU, HGBUR, BILIRUBINUR, KETONESUR, PROTEINUR, UROBILINOGEN, NITRITE, LEUKOCYTESUR    Radiographic Studies: CT Head Wo Contrast  Result Date: 10/18/2020 CLINICAL DATA:  Mental status changes. EXAM: CT HEAD WITHOUT CONTRAST TECHNIQUE: Contiguous axial images were obtained from the base of the skull through the vertex without intravenous contrast. COMPARISON:  None. FINDINGS: Brain: There is no evidence for acute hemorrhage, hydrocephalus, mass lesion, or abnormal extra-axial fluid collection. No definite CT evidence for acute infarction. Intravascular contrast evident secondary to recent CTA Chest, limiting assessment for subtle subarachnoid hemorrhage. Patchy low attenuation in the deep hemispheric and periventricular white matter is nonspecific, but likely reflects chronic microvascular ischemic demyelination. Vascular: Persistent enhancement of blood pool from recent CTA Chest . Skull: No evidence for fracture. No worrisome lytic or sclerotic lesion. Sinuses/Orbits: The visualized paranasal sinuses and mastoid air cells are clear. Visualized portions of  the globes and intraorbital fat are unremarkable. Other: None. IMPRESSION: 1. No acute intracranial abnormality. 2. Atrophy with chronic small vessel white matter ischemic disease. Electronically Signed   By: Misty Stanley M.D.   On: 10/18/2020 13:27   CT Angio Chest PE W/Cm &/Or Wo Cm  Result Date: 10/18/2020 CLINICAL DATA:  60 year old female with shortness of breath and elevated D-dimer. EXAM: CT ANGIOGRAPHY CHEST WITH CONTRAST TECHNIQUE: Multidetector CT imaging of the chest was performed using the standard protocol during bolus administration of intravenous contrast. Multiplanar CT image reconstructions and MIPs were obtained to evaluate the vascular anatomy. CONTRAST:  179mL OMNIPAQUE IOHEXOL 350 MG/ML SOLN COMPARISON:  04/15/2018 CT.  10/18/2020 prior radiographs FINDINGS: Cardiovascular: Satisfactory opacification of the pulmonary arteries to the segmental level. No evidence of pulmonary embolism. Cardiomegaly is noted. No thoracic aortic aneurysm or pericardial effusion identified. Mediastinum/Nodes: Enlarged LEFT hilar nodes are identified with a 1.7 cm index node (series 6: Image 47). Other UPPER limits of normal sized mediastinal lymph nodes are identified. The thyroid gland, trachea and esophagus are unremarkable. Lungs/Pleura: A 3 x 3.6 x 3.9 cm mass versus masslike consolidation is noted within the posteromedial aspect of the LEFT LOWER lobe (8:50). Multiple primarily subcentimeter bilateral pulmonary nodules are identified, many which are new from 04/15/2018. Index nodules include a 1.4 x 1.4 x 2 cm LEFT apical nodule (8:15), a 8 mm LEFT UPPER lobe nodule (8:23) and 6 mm LEFT LOWER lobe nodule (8:62). Scattered areas of atelectasis within both lungs are identified. No pleural effusion or pneumothorax identified. Upper Abdomen: A nonspecific 1.8 cm hypodense splenic lesion (6:80) is noted. Probable hepatic steatosis identified. Musculoskeletal: New 3 mm lytic lesions within the LEFT 5th rib and  LEFT 8th rib are identified) 8:35 and 8:60. A 6 mm area of sclerosis within the anterior RIGHT 5th rib is noted. No other new bony abnormalities are identified. Review of the MIP images confirms the above findings. IMPRESSION: 1. 3 x 3.6 x 3.9 cm mass versus masslike consolidation within the posteromedial LEFT LOWER lobe - suspicious for malignancy. Multiple bilateral pulmonary nodules and enlarged LEFT hilar lymph nodes worrisome for metastatic disease. 2. New 3 mm lytic lesions within the LEFT 5th rib  and LEFT 8th rib - suspicious for metastatic disease. Indeterminate 6 mm sclerosis within the RIGHT 5th rib. 3. Indeterminate 1.8 cm splenic lesion. Consider elective MRI for further evaluation. 4. No evidence of pulmonary emboli or thoracic aortic aneurysm. 5. Cardiomegaly. 6. Probable hepatic steatosis. Electronically Signed   By: Margarette Canada M.D.   On: 10/18/2020 11:18   DG Chest Portable 1 View  Result Date: 10/18/2020 CLINICAL DATA:  Shortness of breath. EXAM: PORTABLE CHEST 1 VIEW COMPARISON:  None. FINDINGS: Mild cardiomegaly is noted. Mild left midlung subsegmental atelectasis is noted. No pneumothorax or significant pleural effusion is noted. Right lung is clear. Bony thorax is unremarkable. IMPRESSION: Mild left midlung subsegmental atelectasis. Electronically Signed   By: Marijo Conception M.D.   On: 10/18/2020 08:13    EKG: Independently reviewed.  Sinus rhythm at 100.  First-degree heart block.  P pulmonale.  Isolated Q in V1.  Nonspecific ST-T wave changes.  Poor R wave progression.   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  Acute on chronic hypercarbic respiratory failure after being  given morphine for back pain Secondary to respiratory depression from morphine in a patient with obstructive, restrictive and infiltrative lung disease and very poor to no respiratory reserve. Patient doing well on BiPAP She should be able to have improved respiratory reserve once the morphine wears off She was already more awake and was able to talk through the BiPAP when I saw her Would discontinue BiPAP when she is more awake and her respiratory drive is better. Patient is on 3 L home O2 however would likely need to go home on higher doses of oxygen given progression of disease seen on CT which was likely cause of worsening DOE at home. CTA done prior to morphine showed no acute cause of worsening DOE other than progression of disease.  No PE.  No new infiltrate.  No vascular congestion  COPD I do not think patient has any bronchospasm or acute exacerbation I think this is just simple respiratory depression from morphine with no respiratory reserve I have not started patient on steroids or antibiotics Can reassess if patient does not come off BiPAP once morphine has been metabolized out of her system.  Metastatic lung CA Patient has an upcoming first appointment with Dr. Lorna Few at [facility] Daughter was very upset because apparently her previous oncologist told her that chemotherapy would be able to cure the cancer.  She was extremely distressed to hear that the cancer had spread and was metastatic.  Back pain Likely secondary to metastatic disease At this point would hold all narcotics until she is more awake and able to breathe on her own Given her body habitus and her poor respiratory reserve, would proceed extremely cautiously with opioid pain medications.  She is on hydrocodone at home which she has been tolerating well but would hold this for now.  Elevated troponin Patient without chest pain, minimal elevation troponin likely secondary to patient's episode of hypoxia  when she was off of her oxygen on route to the hospital. No further work-up or intervention warranted other than keeping patient on her oxygen. Cardiology consult was done and they recommend echocardiogram which was ordered.   Leukocytosis Likely secondary to advanced  malignancy.  HTN Continue carvedilol, irbesartan, HCTZ per home doses  DM2 Hold metformin SSI AC at bedtime ordered     Other information:   DVT prophylaxis: Lovenox ordered. Code Status: Full Family Communication: Patient's daughter Edwin Dada was at bedside throughout Disposition Plan: Home Consults called: ED called cardiology consult Admission status: Observation  Leeba Barbe Tublu Maham Quintin [facility]  If 7PM-7AM, please contact night-coverage [email or web address]

## 2020-10-18 NOTE — Consult Note (Signed)
Hospitalist Service Medical Consultation   Tiffany Owen  WCB:[id number]  DOB: 1960/05/23  DOA: 10/18/2020  PCP: Lujean Amel, MD    Requesting physician: Dr Karle Starch  Reason for consultation: Hypoxia    History of Present Illness: Tiffany Owen is an 60 y.o. female with chronic hypoxic respiratory failure secondary to COPD and metastatic lung ca who is brought in by daughter for increasing shortness of breath and back pain.  When patient presented she did not have her oxygen on and her O2 saturations were in the 50s.  Patient was placed on her usual 3 L of oxygen's with marked improvement in oxygenation to the mid to high 90s.  Patient underwent a CT angiogram which showed significant progression of disease but no new infiltrates, vascular congestion or any other acute process.  Patient was also noted to have new bony mets which corresponded to patient's area of back pain.  Patient was called in for admissions because of initial troponin of 44 with 79 on repeat.   When I went to see patient, she was lethargic and sleepy.  She stated that she was not short of breath.  She denied any chest pain.  Patient denied confusion but said that she was not sure why she was so sleepy.  Of note patient had been given morphine 4 mg IV for her back pain.  Patient states she had not been sleepy when she came in this morning but her shortness of breath was her concern.   Patient denies recent fevers or chills.  No change in baseline cough.  No abdominal pain.  No diarrhea.  No nausea or vomiting.  No dysuria.  Review of Systems:  As per HPI otherwise 10 point review of systems negative.   Past Medical History: No past medical history on file.   Allergies:  No Known Allergies   Social History:  has no history on file for tobacco use, alcohol use, and drug use.   Family History: No family history on file.   Physical Exam: Vitals:   10/18/20 1200 10/18/20 1245 10/18/20 1335 10/18/20  1345  BP: 138/81 129/84 (!) 143/96 109/78  Pulse: 98  (!) 101 97  Resp: (!) 23 (!) 26 (!) 29 (!) 29  Temp:      TempSrc:      SpO2: (!) 89%  (!) 89% 93%    Constitutional: Somnolent lethargic female lying in bed who is arousable by voice alone but wants to go back to sleep. CVS: S1-S2, regular Respiratory: Decreased breath sounds bilaterally GI: NABS, soft, NT, ND, no palpable masses.  LE: No edema. No cyanosis Neuro: Lethargic, moving all extremities, answering questions appropriately.  Data reviewed:  I have personally reviewed following labs and imaging studies Labs:  CBC: Recent Labs  Lab 10/18/20 0728  WBC 19.6*  HGB 14.7  HCT 46.8*  MCV 94.9  PLT 616    Basic Metabolic Panel: Recent Labs  Lab 10/18/20 0728  NA 140  K 4.2  CL 95*  CO2 35*  GLUCOSE 164*  BUN 18  CREATININE 0.91  CALCIUM 9.1   GFR CrCl cannot be calculated (Unknown ideal weight.). Liver Function Tests: No results for input(s): AST, ALT, ALKPHOS, BILITOT, PROT, ALBUMIN in the last 168 hours. No results for input(s): LIPASE, AMYLASE in the last 168 hours. No results for input(s): AMMONIA in the last 168 hours. Coagulation profile Recent Labs  Lab 10/18/20 1126  INR 1.2    Cardiac Enzymes: No results for input(s): CKTOTAL, CKMB, CKMBINDEX, TROPONINI in the last 168 hours. BNP: Invalid input(s): POCBNP CBG: No results for input(s): GLUCAP in the last 168 hours. D-Dimer Recent Labs    10/18/20 0729  DDIMER 7.42*   Hgb A1c No results for input(s): HGBA1C in the last 72 hours. Lipid Profile No results for input(s): CHOL, HDL, LDLCALC, TRIG, CHOLHDL, LDLDIRECT in the last 72 hours. Thyroid function studies No results for input(s): TSH, T4TOTAL, T3FREE, THYROIDAB in the last 72 hours.  Invalid input(s): FREET3 Anemia work up No results for input(s): VITAMINB12, FOLATE, FERRITIN, TIBC, IRON, RETICCTPCT in the last 72 hours. Urinalysis No results found for: COLORURINE,  APPEARANCEUR, [place], [place], GLUCOSEU, [place], [place], [place], PROTEINUR, UROBILINOGEN, NITRITE, LEUKOCYTESUR   Sepsis Labs Invalid input(s): PROCALCITONIN,  WBC,  LACTICIDVEN Microbiology Recent Results (from the past 240 hour(s))  Resp Panel by RT-PCR (Flu A&B, Covid) Nasopharyngeal Swab     Status: None   Collection Time: 10/18/20 11:20 AM   Specimen: Nasopharyngeal Swab; Nasopharyngeal(NP) swabs in vial transport medium  Result Value Ref Range Status   SARS Coronavirus 2 by RT PCR NEGATIVE NEGATIVE Final    Comment: (NOTE) SARS-CoV-2 target nucleic acids are NOT DETECTED.  The SARS-CoV-2 RNA is generally detectable in upper respiratory specimens during the acute phase of infection. The lowest concentration of SARS-CoV-2 viral copies this assay can detect is 138 copies/mL. A negative result does not preclude SARS-Cov-2 infection and should not be used as the sole basis for treatment or other patient management decisions. A negative result may occur with  improper specimen collection/handling, submission of specimen other than nasopharyngeal swab, presence of viral mutation(s) within the areas targeted by this assay, and inadequate number of viral copies(<138 copies/mL). A negative result must be combined with clinical observations, patient history, and epidemiological information. The expected result is Negative.  Fact Sheet for Patients:  EntrepreneurPulse.com.au  Fact Sheet for Healthcare Providers:  IncredibleEmployment.be  This test is no t yet approved or cleared by the [place] FDA and  has been authorized for detection and/or diagnosis of SARS-CoV-2 by FDA under an Emergency Use Authorization (EUA). This EUA will remain  in effect (meaning this test can be used) for the duration of the COVID-19 declaration under Section 564(b)(1) of the Act, 21 U.S.C.section 360bbb-3(b)(1), unless the authorization is terminated  or  revoked sooner.       Influenza A by PCR NEGATIVE NEGATIVE Final   Influenza B by PCR NEGATIVE NEGATIVE Final    Comment: (NOTE) The Xpert Xpress SARS-CoV-2/FLU/RSV plus assay is intended as an aid in the diagnosis of influenza from Nasopharyngeal swab specimens and should not be used as a sole basis for treatment. Nasal washings and aspirates are unacceptable for Xpert Xpress SARS-CoV-2/FLU/RSV testing.  Fact Sheet for Patients: EntrepreneurPulse.com.au  Fact Sheet for Healthcare Providers: IncredibleEmployment.be  This test is not yet approved or cleared by the [place] FDA and has been authorized for detection and/or diagnosis of SARS-CoV-2 by FDA under an Emergency Use Authorization (EUA). This EUA will remain in effect (meaning this test can be used) for the duration of the COVID-19 declaration under Section 564(b)(1) of the Act, 21 U.S.C. section 360bbb-3(b)(1), unless the authorization is terminated or revoked.  Performed at [facility], [place] [address] [place], [place] [address]        Inpatient Medications:   Scheduled Meds: Continuous Infusions:   Radiological Exams on Admission: CT Head Wo  Contrast  Result Date: 10/18/2020 CLINICAL DATA:  Mental status changes. EXAM: CT HEAD WITHOUT CONTRAST TECHNIQUE: Contiguous axial images were obtained from the base of the skull through the vertex without intravenous contrast. COMPARISON:  None. FINDINGS: Brain: There is no evidence for acute hemorrhage, hydrocephalus, mass lesion, or abnormal extra-axial fluid collection. No definite CT evidence for acute infarction. Intravascular contrast evident secondary to recent CTA Chest, limiting assessment for subtle subarachnoid hemorrhage. Patchy low attenuation in the deep hemispheric and periventricular white matter is nonspecific, but likely reflects chronic microvascular ischemic demyelination. Vascular: Persistent enhancement of  blood pool from recent CTA Chest . Skull: No evidence for fracture. No worrisome lytic or sclerotic lesion. Sinuses/Orbits: The visualized paranasal sinuses and mastoid air cells are clear. Visualized portions of the globes and intraorbital fat are unremarkable. Other: None. IMPRESSION: 1. No acute intracranial abnormality. 2. Atrophy with chronic small vessel white matter ischemic disease. Electronically Signed   By: Misty Stanley M.D.   On: 10/18/2020 13:27   CT Angio Chest PE W/Cm &/Or Wo Cm  Result Date: 10/18/2020 CLINICAL DATA:  60 year old female with shortness of breath and elevated D-dimer. EXAM: CT ANGIOGRAPHY CHEST WITH CONTRAST TECHNIQUE: Multidetector CT imaging of the chest was performed using the standard protocol during bolus administration of intravenous contrast. Multiplanar CT image reconstructions and MIPs were obtained to evaluate the vascular anatomy. CONTRAST:  15mL OMNIPAQUE IOHEXOL 350 MG/ML SOLN COMPARISON:  04/15/2018 CT.  10/18/2020 prior radiographs FINDINGS: Cardiovascular: Satisfactory opacification of the pulmonary arteries to the segmental level. No evidence of pulmonary embolism. Cardiomegaly is noted. No thoracic aortic aneurysm or pericardial effusion identified. Mediastinum/Nodes: Enlarged LEFT hilar nodes are identified with a 1.7 cm index node (series 6: Image 47). Other UPPER limits of normal sized mediastinal lymph nodes are identified. The thyroid gland, trachea and esophagus are unremarkable. Lungs/Pleura: A 3 x 3.6 x 3.9 cm mass versus masslike consolidation is noted within the posteromedial aspect of the LEFT LOWER lobe (8:50). Multiple primarily subcentimeter bilateral pulmonary nodules are identified, many which are new from 04/15/2018. Index nodules include a 1.4 x 1.4 x 2 cm LEFT apical nodule (8:15), a 8 mm LEFT UPPER lobe nodule (8:23) and 6 mm LEFT LOWER lobe nodule (8:62). Scattered areas of atelectasis within both lungs are identified. No pleural effusion or  pneumothorax identified. Upper Abdomen: A nonspecific 1.8 cm hypodense splenic lesion (6:80) is noted. Probable hepatic steatosis identified. Musculoskeletal: New 3 mm lytic lesions within the LEFT 5th rib and LEFT 8th rib are identified) 8:35 and 8:60. A 6 mm area of sclerosis within the anterior RIGHT 5th rib is noted. No other new bony abnormalities are identified. Review of the MIP images confirms the above findings. IMPRESSION: 1. 3 x 3.6 x 3.9 cm mass versus masslike consolidation within the posteromedial LEFT LOWER lobe - suspicious for malignancy. Multiple bilateral pulmonary nodules and enlarged LEFT hilar lymph nodes worrisome for metastatic disease. 2. New 3 mm lytic lesions within the LEFT 5th rib and LEFT 8th rib - suspicious for metastatic disease. Indeterminate 6 mm sclerosis within the RIGHT 5th rib. 3. Indeterminate 1.8 cm splenic lesion. Consider elective MRI for further evaluation. 4. No evidence of pulmonary emboli or thoracic aortic aneurysm. 5. Cardiomegaly. 6. Probable hepatic steatosis. Electronically Signed   By: Margarette Canada M.D.   On: 10/18/2020 11:18   DG Chest Portable 1 View  Result Date: 10/18/2020 CLINICAL DATA:  Shortness of breath. EXAM: PORTABLE CHEST 1 VIEW COMPARISON:  None. FINDINGS: Mild cardiomegaly  is noted. Mild left midlung subsegmental atelectasis is noted. No pneumothorax or significant pleural effusion is noted. Right lung is clear. Bony thorax is unremarkable. IMPRESSION: Mild left midlung subsegmental atelectasis. Electronically Signed   By: Marijo Conception M.D.   On: 10/18/2020 08:13    Impression/Recommendations Active Problems:   * No active hospital problems. *  60 year old female with chronic hypoxic respiratory failure was worked up and found to have progression of disease.  Patient can be discharged home on increased doses of oxygen to follow-up with her oncologist for further palliative treatment as warranted or referral to palliative care.  Shortness  of breath with hypoxia Unfortunate 60 year old female with chronic hypoxic respiratory failure secondary to COPD and metastatic lung cancer presented with hypoxia off her home oxygen. Patient was saturating well when she was placed back on her oxygen in the ED. CT angiogram ruled out acute process but did show progression of disease which would account for her increasing shortness of breath. Patient can be discharged but will need to go up on her home O2 to 4 L especially with ambulation and follow-up with her oncologist as an outpatient for further palliative therapy or referral to palliative care.  Somnolence/altered mental status secondary to IV morphine given in the ED Patient did not have altered mental status when she presented to the ED however received 4 mg of morphine and became somnolent. Head CT is negative for any intracranial lesions. Mental status should improve as the morphine is metabolized from her system Would make sure she gets adequate hydration as warranted.  Elevated troponin Patient without chest pain, minimal elevation troponin likely secondary to patient's episode of hypoxia when she was off of her oxygen on route to the hospital. No further work-up or intervention warranted other than keeping patient on her oxygen.  Leukocytosis Likely secondary to advanced malignancy.    Thank you for this consultation.    Vashti Hey M.D. [facility] 10/18/2020, 2:06 PM Please page [facility] [email or web address] for questions

## 2020-10-18 NOTE — ED Provider Notes (Signed)
[facility] EMERGENCY DEPARTMENT Provider Note  CSN: [id number] Arrival date & time: 10/18/20 1962    History Chief Complaint  Patient presents with   Shortness of Breath    Tiffany Owen is a 60 y.o. female with history of lung cancer, currently getting chemotherapy reports 3 days of increasing SOB. She usually uses oxygen at home, 2-3L by [place]. She also has inhalers and nebulizers. She has felt wheezy but denies any chest pain, fever or covid exposure. She has had lower back pain. Denies leg swelling. She did not wear her oxygen enroute to the ED and was noted to be severely hypoxic on arrival, improved with resumption to her [place] oxygen.    No past medical history on file.    No family history on file.      Home Medications Prior to Admission medications   Not on File     Allergies    Patient has no known allergies.   Review of Systems   Review of Systems A comprehensive review of systems was completed and negative except as noted in HPI.    Physical Exam BP 123/78   Pulse 99   Temp 97.8 F (36.6 C) (Oral)   Resp (!) 21   SpO2 93%   Physical Exam Vitals and nursing note reviewed.  Constitutional:      Appearance: Normal appearance. She is obese. She is not ill-appearing.  HENT:     Head: Normocephalic and atraumatic.     Nose: Nose normal.     Mouth/Throat:     Mouth: Mucous membranes are moist.  Eyes:     Extraocular Movements: Extraocular movements intact.     Conjunctiva/sclera: Conjunctivae normal.  Cardiovascular:     Rate and Rhythm: Normal rate.  Pulmonary:     Effort: Pulmonary effort is normal.     Breath sounds: Decreased breath sounds present. No wheezing or rales.  Abdominal:     General: Abdomen is flat.     Palpations: Abdomen is soft.     Tenderness: There is no abdominal tenderness.  Musculoskeletal:        General: No swelling. Normal range of motion.     Cervical back: Neck supple.  Skin:    General: Skin is warm and dry.   Neurological:     General: No focal deficit present.     Mental Status: She is alert.  Psychiatric:        Mood and Affect: Mood normal.     ED Results / Procedures / Treatments   Labs (all labs ordered are listed, but only abnormal results are displayed) Labs Reviewed  BASIC METABOLIC PANEL - Abnormal; Notable for the following components:      Result Value   Chloride 95 (*)    CO2 35 (*)    Glucose, Bld 164 (*)    All other components within normal limits  CBC - Abnormal; Notable for the following components:   WBC 19.6 (*)    HCT 46.8 (*)    All other components within normal limits  BRAIN NATRIURETIC PEPTIDE - Abnormal; Notable for the following components:   B Natriuretic Peptide 793.3 (*)    All other components within normal limits  D-DIMER, QUANTITATIVE - Abnormal; Notable for the following components:   D-Dimer, Quant 7.42 (*)    All other components within normal limits  I-STAT ARTERIAL BLOOD GAS, ED - Abnormal; Notable for the following components:   pH, Arterial 7.290 (*)    pCO2  arterial 87.3 (*)    pO2, Arterial 63 (*)    Bicarbonate 42.1 (*)    TCO2 45 (*)    Acid-Base Excess 11.0 (*)    All other components within normal limits  TROPONIN I (HIGH SENSITIVITY) - Abnormal; Notable for the following components:   Troponin I (High Sensitivity) 44 (*)    All other components within normal limits  TROPONIN I (HIGH SENSITIVITY) - Abnormal; Notable for the following components:   Troponin I (High Sensitivity) 69 (*)    All other components within normal limits  RESP PANEL BY RT-PCR (FLU A&B, COVID) ARPGX2  APTT  PROTIME-INR  BLOOD GAS, ARTERIAL  URINALYSIS, ROUTINE W REFLEX MICROSCOPIC  TROPONIN I (HIGH SENSITIVITY)    EKG EKG Interpretation  Date/Time:  Sunday October 18 2020 07:03:32 EDT Ventricular Rate:  104 PR Interval:  132 QRS Duration: 78 QT Interval:  360 QTC Calculation: 473 R Axis:   95 Text Interpretation: Sinus tachycardia Rightward axis  Possible Anterior infarct , age undetermined Abnormal ECG No old tracing to compare Confirmed by Calvert Cantor [phone]) on 10/18/2020 7:34:36 AM  Radiology CT Head Wo Contrast  Result Date: 10/18/2020 CLINICAL DATA:  Mental status changes. EXAM: CT HEAD WITHOUT CONTRAST TECHNIQUE: Contiguous axial images were obtained from the base of the skull through the vertex without intravenous contrast. COMPARISON:  None. FINDINGS: Brain: There is no evidence for acute hemorrhage, hydrocephalus, mass lesion, or abnormal extra-axial fluid collection. No definite CT evidence for acute infarction. Intravascular contrast evident secondary to recent CTA Chest, limiting assessment for subtle subarachnoid hemorrhage. Patchy low attenuation in the deep hemispheric and periventricular white matter is nonspecific, but likely reflects chronic microvascular ischemic demyelination. Vascular: Persistent enhancement of blood pool from recent CTA Chest . Skull: No evidence for fracture. No worrisome lytic or sclerotic lesion. Sinuses/Orbits: The visualized paranasal sinuses and mastoid air cells are clear. Visualized portions of the globes and intraorbital fat are unremarkable. Other: None. IMPRESSION: 1. No acute intracranial abnormality. 2. Atrophy with chronic small vessel white matter ischemic disease. Electronically Signed   By: Misty Stanley M.D.   On: 10/18/2020 13:27   CT Angio Chest PE W/Cm &/Or Wo Cm  Result Date: 10/18/2020 CLINICAL DATA:  60 year old female with shortness of breath and elevated D-dimer. EXAM: CT ANGIOGRAPHY CHEST WITH CONTRAST TECHNIQUE: Multidetector CT imaging of the chest was performed using the standard protocol during bolus administration of intravenous contrast. Multiplanar CT image reconstructions and MIPs were obtained to evaluate the vascular anatomy. CONTRAST:  125mL OMNIPAQUE IOHEXOL 350 MG/ML SOLN COMPARISON:  04/15/2018 CT.  10/18/2020 prior radiographs FINDINGS: Cardiovascular: Satisfactory  opacification of the pulmonary arteries to the segmental level. No evidence of pulmonary embolism. Cardiomegaly is noted. No thoracic aortic aneurysm or pericardial effusion identified. Mediastinum/Nodes: Enlarged LEFT hilar nodes are identified with a 1.7 cm index node (series 6: Image 47). Other UPPER limits of normal sized mediastinal lymph nodes are identified. The thyroid gland, trachea and esophagus are unremarkable. Lungs/Pleura: A 3 x 3.6 x 3.9 cm mass versus masslike consolidation is noted within the posteromedial aspect of the LEFT LOWER lobe (8:50). Multiple primarily subcentimeter bilateral pulmonary nodules are identified, many which are new from 04/15/2018. Index nodules include a 1.4 x 1.4 x 2 cm LEFT apical nodule (8:15), a 8 mm LEFT UPPER lobe nodule (8:23) and 6 mm LEFT LOWER lobe nodule (8:62). Scattered areas of atelectasis within both lungs are identified. No pleural effusion or pneumothorax identified. Upper Abdomen: A nonspecific 1.8 cm  hypodense splenic lesion (6:80) is noted. Probable hepatic steatosis identified. Musculoskeletal: New 3 mm lytic lesions within the LEFT 5th rib and LEFT 8th rib are identified) 8:35 and 8:60. A 6 mm area of sclerosis within the anterior RIGHT 5th rib is noted. No other new bony abnormalities are identified. Review of the MIP images confirms the above findings. IMPRESSION: 1. 3 x 3.6 x 3.9 cm mass versus masslike consolidation within the posteromedial LEFT LOWER lobe - suspicious for malignancy. Multiple bilateral pulmonary nodules and enlarged LEFT hilar lymph nodes worrisome for metastatic disease. 2. New 3 mm lytic lesions within the LEFT 5th rib and LEFT 8th rib - suspicious for metastatic disease. Indeterminate 6 mm sclerosis within the RIGHT 5th rib. 3. Indeterminate 1.8 cm splenic lesion. Consider elective MRI for further evaluation. 4. No evidence of pulmonary emboli or thoracic aortic aneurysm. 5. Cardiomegaly. 6. Probable hepatic steatosis.  Electronically Signed   By: Margarette Canada M.D.   On: 10/18/2020 11:18   DG Chest Portable 1 View  Result Date: 10/18/2020 CLINICAL DATA:  Shortness of breath. EXAM: PORTABLE CHEST 1 VIEW COMPARISON:  None. FINDINGS: Mild cardiomegaly is noted. Mild left midlung subsegmental atelectasis is noted. No pneumothorax or significant pleural effusion is noted. Right lung is clear. Bony thorax is unremarkable. IMPRESSION: Mild left midlung subsegmental atelectasis. Electronically Signed   By: Marijo Conception M.D.   On: 10/18/2020 08:13    Procedures Procedures  Medications Ordered in the ED Medications  morphine 4 MG/ML injection 4 mg (4 mg Intravenous Given 10/18/20 0910)  iohexol (OMNIPAQUE) 350 MG/ML injection 100 mL (100 mLs Intravenous Contrast Given 10/18/20 1028)     MDM Rules/Calculators/A&P MDM Patient with reported lung cancer, no old records available under this MRN, here for SOB increasing for several days. Will check labs and CXR. Feeling better on oxygen. No wheezing on exam.   ED Course  I have reviewed the triage vital signs and the nursing notes.  Pertinent labs & imaging results that were available during my care of the patient were reviewed by me and considered in my medical decision making (see chart for details).  Clinical Course as of 10/18/20 1456  Sun Oct 18, 2020  0747 Patient's medical record marked for merge with MRN: [id number]. Per records in that MRN she was diagnosed with lung cancer in the last couple of months, started chemo in [place], [place] but has moved to live in [place] with her daughter and is planning to continue her care here. She is scheduled to see local Oncology next week.  [CS]  1497 CBC with leukocytosis.  [CS]  0263 BMP is unremarkable. BNP and Trop are both mildly elevated. CXR without significant pulm edema [CS]  0949 Dimer is elevated, will send for CTA to rule out PE. Covid swab also ordered.  [CS]  7858 CTA neg for PE, Trop is increasing. Will  discuss admission with Hospitalist.  [CS]  1208 Spoke with Dr. Jamse Arn, Hospitalist, who would like for the patient to try walking with her usual home oxygen. She does not feel that the elevated Troponin is sufficient indication for admission.  [CS]  1327 Patient seen by Dr. Jamse Arn, who is concerned about her somnolence. It may be from Morphine she received earlier but will send for CT head to rule out intracranial process. No known brain mets. Patient is unable to stand due to weakness.  [CS]  8502 Covid is neg.  [CS]  7741 CT head is neg. Will discuss disposition  with Dr. Jamse Arn.  [CS]  1410 Spoke again with Dr. Jamse Arn who feels the patient's weakness is due to morphine she received earlier and does not see any other indication for admission. She suggests continued ED management until able to stand and walk. Recommends increasing oxygen at home. Outpatient oncology follow up already scheduled this week.  [CS]  3419 Care of the patient will be signed out to Dr. Joya Gaskins at the change of shift. Will add a third Trop, UA and Blood gas to further evaluate the patient's presenting complaints.  [CS]    Clinical Course User Index [CS] Truddie Hidden, MD    Final Clinical Impression(s) / ED Diagnoses Final diagnoses:  SOB (shortness of breath)  Elevated troponin  Malignant neoplasm of lung, unspecified laterality, unspecified part of lung [facility])    Rx / DC Orders ED Discharge Orders     None        Truddie Hidden, MD 10/18/20 1456

## 2020-10-19 ENCOUNTER — Observation Stay (HOSPITAL_COMMUNITY): Payer: BC Managed Care – PPO

## 2020-10-19 ENCOUNTER — Encounter (HOSPITAL_COMMUNITY): Payer: Self-pay | Admitting: Internal Medicine

## 2020-10-19 DIAGNOSIS — G4733 Obstructive sleep apnea (adult) (pediatric): Secondary | ICD-10-CM | POA: Diagnosis present

## 2020-10-19 DIAGNOSIS — Z7984 Long term (current) use of oral hypoglycemic drugs: Secondary | ICD-10-CM | POA: Diagnosis not present

## 2020-10-19 DIAGNOSIS — C787 Secondary malignant neoplasm of liver and intrahepatic bile duct: Secondary | ICD-10-CM | POA: Diagnosis present

## 2020-10-19 DIAGNOSIS — R778 Other specified abnormalities of plasma proteins: Secondary | ICD-10-CM

## 2020-10-19 DIAGNOSIS — G9341 Metabolic encephalopathy: Secondary | ICD-10-CM | POA: Diagnosis present

## 2020-10-19 DIAGNOSIS — J9622 Acute and chronic respiratory failure with hypercapnia: Secondary | ICD-10-CM | POA: Diagnosis present

## 2020-10-19 DIAGNOSIS — E785 Hyperlipidemia, unspecified: Secondary | ICD-10-CM | POA: Diagnosis present

## 2020-10-19 DIAGNOSIS — J449 Chronic obstructive pulmonary disease, unspecified: Secondary | ICD-10-CM | POA: Diagnosis present

## 2020-10-19 DIAGNOSIS — Z9221 Personal history of antineoplastic chemotherapy: Secondary | ICD-10-CM | POA: Diagnosis not present

## 2020-10-19 DIAGNOSIS — R0602 Shortness of breath: Secondary | ICD-10-CM | POA: Diagnosis present

## 2020-10-19 DIAGNOSIS — I10 Essential (primary) hypertension: Secondary | ICD-10-CM | POA: Diagnosis present

## 2020-10-19 DIAGNOSIS — D72829 Elevated white blood cell count, unspecified: Secondary | ICD-10-CM | POA: Diagnosis present

## 2020-10-19 DIAGNOSIS — Z7189 Other specified counseling: Secondary | ICD-10-CM

## 2020-10-19 DIAGNOSIS — R52 Pain, unspecified: Secondary | ICD-10-CM | POA: Diagnosis not present

## 2020-10-19 DIAGNOSIS — Z9981 Dependence on supplemental oxygen: Secondary | ICD-10-CM | POA: Diagnosis not present

## 2020-10-19 DIAGNOSIS — J984 Other disorders of lung: Secondary | ICD-10-CM | POA: Diagnosis present

## 2020-10-19 DIAGNOSIS — Z6841 Body Mass Index (BMI) 40.0 and over, adult: Secondary | ICD-10-CM | POA: Diagnosis not present

## 2020-10-19 DIAGNOSIS — J9621 Acute and chronic respiratory failure with hypoxia: Secondary | ICD-10-CM | POA: Diagnosis present

## 2020-10-19 DIAGNOSIS — M549 Dorsalgia, unspecified: Secondary | ICD-10-CM

## 2020-10-19 DIAGNOSIS — Z23 Encounter for immunization: Secondary | ICD-10-CM | POA: Diagnosis not present

## 2020-10-19 DIAGNOSIS — C7951 Secondary malignant neoplasm of bone: Secondary | ICD-10-CM | POA: Diagnosis present

## 2020-10-19 DIAGNOSIS — Z515 Encounter for palliative care: Secondary | ICD-10-CM

## 2020-10-19 DIAGNOSIS — C3490 Malignant neoplasm of unspecified part of unspecified bronchus or lung: Secondary | ICD-10-CM | POA: Diagnosis present

## 2020-10-19 DIAGNOSIS — Z9119 Patient's noncompliance with other medical treatment and regimen: Secondary | ICD-10-CM | POA: Diagnosis not present

## 2020-10-19 DIAGNOSIS — E119 Type 2 diabetes mellitus without complications: Secondary | ICD-10-CM | POA: Diagnosis present

## 2020-10-19 DIAGNOSIS — Z20822 Contact with and (suspected) exposure to covid-19: Secondary | ICD-10-CM | POA: Diagnosis present

## 2020-10-19 DIAGNOSIS — E872 Acidosis: Secondary | ICD-10-CM | POA: Diagnosis present

## 2020-10-19 DIAGNOSIS — G893 Neoplasm related pain (acute) (chronic): Secondary | ICD-10-CM | POA: Diagnosis present

## 2020-10-19 LAB — BASIC METABOLIC PANEL
Anion gap: 6 (ref 5–15)
BUN: 16 mg/dL (ref 8–23)
CO2: 35 mmol/L — ABNORMAL HIGH (ref 22–32)
Calcium: 8.8 mg/dL — ABNORMAL LOW (ref 8.9–10.3)
Chloride: 95 mmol/L — ABNORMAL LOW (ref 98–111)
Creatinine, Ser: 0.73 mg/dL (ref 0.44–1.00)
GFR, Estimated: >60 mL/min (ref >60)
Glucose, Bld: 114 mg/dL — ABNORMAL HIGH (ref 70–99)
Potassium: 4.2 mmol/L (ref 3.5–5.1)
Sodium: 136 mmol/L (ref 135–145)

## 2020-10-19 LAB — ECHOCARDIOGRAM COMPLETE
AR max vel: 2.17 cm2
AV Area VTI: 2.1 cm2
AV Area mean vel: 2.08 cm2
AV Mean grad: 7 mmHg
AV Peak grad: 13 mmHg
Ao pk vel: 1.8 m/s
Area-P 1/2: 2.64 cm2
Height: 58 in
MV VTI: 1.96 cm2
S' Lateral: 2.3 cm
Weight: 4338.65 oz

## 2020-10-19 LAB — GLUCOSE, CAPILLARY
Glucose-Capillary: 100 mg/dL — ABNORMAL HIGH (ref 70–99)
Glucose-Capillary: 101 mg/dL — ABNORMAL HIGH (ref 70–99)
Glucose-Capillary: 98 mg/dL (ref 70–99)
Glucose-Capillary: 95 mg/dL (ref 70–99)

## 2020-10-19 LAB — CBC
HCT: 41.9 % (ref 36.0–46.0)
Hemoglobin: 12.9 g/dL (ref 12.0–15.0)
MCH: 29.6 pg (ref 26.0–34.0)
MCHC: 30.8 g/dL (ref 30.0–36.0)
MCV: 96.1 fL (ref 80.0–100.0)
Platelets: 173 10*3/uL (ref 150–400)
RBC: 4.36 MIL/uL (ref 3.87–5.11)
RDW: 13.9 % (ref 11.5–15.5)
WBC: 13.1 10*3/uL — ABNORMAL HIGH (ref 4.0–10.5)
nRBC: 0 % (ref 0.0–0.2)

## 2020-10-19 MED ORDER — UMECLIDINIUM BROMIDE 62.5 MCG/INH IN AEPB
1.0000 | INHALATION_SPRAY | Freq: Every day | RESPIRATORY_TRACT | Status: DC
  Administered 2020-10-19 – 2020-10-20 (×2): 1 via RESPIRATORY_TRACT
  Filled 2020-10-19: qty 7

## 2020-10-19 MED ORDER — NALOXONE HCL 0.4 MG/ML IJ SOLN: 0.4000 mg | INTRAMUSCULAR | Status: DC | PRN

## 2020-10-19 MED ORDER — LIDOCAINE 5 % EX PTCH
1.0000 | MEDICATED_PATCH | CUTANEOUS | Status: DC
  Administered 2020-10-19 – 2020-10-20 (×2): 1 via TRANSDERMAL
  Filled 2020-10-19 (×2): qty 1

## 2020-10-19 MED ORDER — IBUPROFEN 400 MG PO TABS
800.0000 mg | ORAL_TABLET | Freq: Four times a day (QID) | ORAL | Status: DC | PRN
  Administered 2020-10-20: 800 mg via ORAL
  Filled 2020-10-19: qty 2

## 2020-10-19 MED ORDER — OXYCODONE HCL 5 MG PO TABS
5.0000 mg | ORAL_TABLET | Freq: Four times a day (QID) | ORAL | Status: DC | PRN
Start: 2020-10-19 — End: 2020-10-21
  Administered 2020-10-20: 5 mg via ORAL
  Filled 2020-10-19: qty 1

## 2020-10-19 MED ORDER — PANTOPRAZOLE SODIUM 40 MG PO TBEC
40.0000 mg | DELAYED_RELEASE_TABLET | Freq: Every day | ORAL | Status: DC
  Administered 2020-10-19 – 2020-10-20 (×2): 40 mg via ORAL
  Filled 2020-10-19 (×2): qty 1

## 2020-10-19 MED ORDER — ACETAMINOPHEN 500 MG PO TABS
1000.0000 mg | ORAL_TABLET | Freq: Three times a day (TID) | ORAL | Status: DC
  Administered 2020-10-19 – 2020-10-20 (×4): 1000 mg via ORAL
  Filled 2020-10-19 (×4): qty 2

## 2020-10-19 NOTE — Progress Notes (Addendum)
OT Cancellation Note  Patient Details Name: Tiffany Owen MRN: [id number] DOB: [birth year]   Cancelled Treatment:    Reason Eval/Treat Not Completed: Other (comment) (Pt had been up in recliner and then returned back to bed upon arrival wanting to rest. Pt had just had PT for PT eval. OT to return for OT eval later today or tomorrow as schedule allows.)  2:30p Pt resting and had been up most of the day. OT to return tomorrow for OT eval as schedule allows.  Jefferey Pica, OTR/L Acute Rehabilitation Services Pager: [phone] Office: [phone]   Junko Ohagan C 10/19/2020, 1:50 PM

## 2020-10-19 NOTE — Consult Note (Signed)
Palliative Care Consult Note                                  Date: 10/19/2020   Patient Name: Tiffany Owen  DOB: 1960-09-11  MRN: [id number]  Age / Sex: 60 y.o., female  PCP: Tiffany Amel, MD Referring Physician: Jonetta Osgood, MD  Reason for Consultation: Establishing goals of care, Non pain symptom management, and Pain control  HPI/Patient Profile: Palliative Care consult requested for goals of care discussion in this 60 y.o. female  with past medical history of metastatic poorly differentiated lung cancer s/p chemotherapy in [place] ([place]), OSA, COPD on home oxygen 2-3L, dCHF, obesity, and hypertension. She was admitted on 10/18/2020 from home with with complaints of worsening shortness of breath and back pain. She was given 33m of IV morphine and became somnolent requiring BiPAP. Has since been able to wean back to baseline oxygen of 3L nasal cannula. CT of chest showed no acute PE, mass in left lower lobe, multiple bilateral nodules, enlarged left hilar lymph nodes, and new cystic lesion on the left fifth, eighth, and right fifth rib. CT of head showed no acute abnormalities.    Past Medical History:  Diagnosis Date   Cancer ([facility]    COPD (chronic obstructive pulmonary disease) (HCC)      Subjective:   This NP Tiffany Owen medical records, received report from team, assessed the patient and then met at the patient's bedside with Ms. GJarmonand her 3 daughters, to discuss diagnosis, prognosis, GOC, EOL wishes disposition and options.   Concept of Palliative Care was introduced as specialized medical care for people and their families living with serious illness.  It focuses on providing relief from the symptoms and stress of a serious illness.  The goal is to improve quality of life for both the patient and the family. Values and goals of care important to patient and family were attempted to be elicited. Family verbalized  understanding and appreciation of support. Tiffany Capuchinshares they recently had discussion with Case Manager and agreed to outpatient Palliative support also.   Tiffany Owen sitting up in the recliner. Alert and oriented. Denies pain. Endorses some fatigue as she just finished working with PT. She is listening to Tiffany Owen   Created space and opportunity for patient  and family to explore thoughts and feelings. Daughters shared frustration around patient's cancer diagnosis, expectations, and extent of her disease. Patient shares her faith in God repeatedly stating "it will be alright. God has it all under control!" Her 2 daughters became emotional. Support provided. Patient seems somewhat frustrated and asked daughters to stop crying as she was the one sick and to be strong. She emphasized they would get through this health journey and again emphasizing their reliance on God's intervention.   We discussed Her current illness and what it means in the larger context of Her on-going co-morbidities. Natural disease trajectory and expectations were discussed. Questions and concerns addressed.   Patient and daughters verbalized their understanding of patient's current illness. They are remaining hopeful for her stability and to follow-up with Dr. MEarlie Serverto gain a better understanding of what the future holds. Tiffany Owen they are not ready to give up and is not prepared to accept that their mother will not survive this!   Patient and family are clear in their expectations to continue to treat the treatable aggressively allowing Tiffany Owen  every opportunity to improve/stabilize. They verbalized understanding patient may require future hospitalizations and medical interventions. They are prepared to face what will come their way in the future with a focus on healing and stability. They made a strong emphasis on they are not interested in discussing or even considering the worst (end-of-life)!   We discussed  at length patient's back pain. Patient states she is not currently having any pain. She shares she is not sure what has changed but she feels much better than she did on admission. Daughters share her struggle with back pain prior to admission however, also feels she is doing much better. We discussed cancer related pain and symptom management. She is receiving Tylenol scheduled and feels this enough for her. She states she is not interested in any additional pain medications. I advised if pain worsens to please let staff know and that we would continue to closely assess. Patient and daughters verbalized understanding and appreciation.   Patient expressing she is tired and would like to get back in bed. She is able to get herself and walk around the room to get in the bed with little to no assist (line management). She denies pain when moving. Assisted with repositioning in bed.   I discussed the importance of continued conversation with family and their medical providers regarding overall plan of care and treatment options, ensuring decisions are within the context of the patients values and GOCs.  Questions and concerns were addressed.  The family was encouraged to call with questions or concerns.  PMT will continue to support holistically as needed.  Life Review: Patient recently relocated to [place] from [place], [place] to live with her daughter, Tiffany Owen due to her cancer dx. She has 4 children and is single. 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 Her 3 other children resided in [place] however, are here to provide support.   Patient Values: Patient states her family and God are the most important things in her life. She loves church and listening to Tiffany Owen which she has playing in the room. States she is relying on her relationship with Tiffany Owen to assist them during these times.   Patient/Family Understanding of Illness: Patient and daughters  verbalized their understanding of patient's metastatic cancer. They are remaining hopeful for the best with a clear goal to continue any and all available treatments to allow her and opportunity to continue to thrive.    Objective:   Primary Diagnoses: Present on Admission:  Acute on chronic respiratory acidosis   Scheduled Meds:  acetaminophen  1,000 mg Oral Q8H   carvedilol  12.5 mg Oral BID WC   enoxaparin (LOVENOX) injection  40 mg Subcutaneous Q24H   hydrochlorothiazide  25 mg Oral Daily   insulin aspart  0-15 Units Subcutaneous TID WC   irbesartan  75 mg Oral Daily   lidocaine  1 patch Transdermal Q24H   pantoprazole  40 mg Oral Q1200   sodium chloride flush  3 mL Intravenous Q12H   umeclidinium bromide  1 puff Inhalation Daily    Continuous Infusions:  sodium chloride      PRN Meds: sodium chloride, ibuprofen, ipratropium-albuterol, naLOXone (NARCAN)  injection, oxyCODONE, polyethylene glycol, prochlorperazine, sodium chloride flush  No Known Allergies  Review of Systems  Constitutional:  Positive for fatigue.  Neurological:  Positive for weakness.  Unless otherwise noted, a complete review of systems is negative.  Physical Exam General: NAD, obese  Cardiovascular: regular rate and rhythm Pulmonary: diminished bilaterally, 3L [place]   Abdomen: soft, nontender, + bowel sounds, obese  Extremities: no edema, no joint deformities Skin: no rashes, warm and dry Neurological: AAO, mood appropriate   Vital Signs:  BP 124/82 (BP Location: Right Arm)   Pulse 85   Temp 98.1 F (36.7 C) (Oral)   Resp (!) 21   Ht '4\' 10"'  (1.473 m)   Wt 123 kg   SpO2 93%   BMI 56.67 kg/m  Pain Scale: 0-10   Pain Score: 4   SpO2: SpO2: 93 % O2 Device:SpO2: 93 % O2 Flow Rate: .O2 Flow Rate (L/min): (S) 2 L/min  IO: Intake/output summary:  Intake/Output Summary (Last 24 hours) at 10/19/2020 1445 Last data filed at 10/19/2020 0935 Gross per 24 hour  Intake 363 ml  Output --  Net 363  ml    LBM: Last BM Date: 10/19/20 Baseline Weight: Weight: 123 kg Most recent weight: Weight: 123 kg      Palliative Assessment/Data: PPS 30%   Advanced Care Planning:   Primary Decision Maker: Patient states her daughters are her decision makers with Francisco Owen being main contact (oldest daughter).   Code Status/Advance Care Planning: Full code  A discussion was had today regarding advanced directives. Concepts specific to code status, artifical feeding and hydration, continued IV antibiotics and rehospitalization was had.    Patient is requesting to remain a full code with full scope care.   Hospice and Palliative Care services outpatient were explained and offered. Patient and family verbalized their understanding and awareness of both palliative and hospice's goals and philosophy of care. Patient and family clear they are not interested in hospice, but is open to outpatient Palliative support.   Decisions/Changes to ACP: Full Code/Full Scope Care  Assessment & Plan:   SUMMARY OF RECOMMENDATIONS   Full Code-as confirmed by patient and her daughters Continue with current plan of care/full scope/aggressive interventions Patient and her daughters are clear they are remaining hopeful for improvement/stability. They are hopeful for continued treatment once the follow-up with Oncology. They are not interested in discussions around end-of-life. They wish to allow patient every opportunity to thrive by any means.  Patient is denying that she is having pain at this time. States she feels that Tylenol is helping. Discussed at length medications to assist in pain management. She verbalized understanding however she and daughters are declining at this time.  Patient is opioid naive as this will be a tedious process if she requires pain management. Would consider Tramdol or Oxy-IR 60m if she agrees. May also utilize Lidoderm patch to back. If requiring for more severe pain would consider IV  fentanyl.  Patient and family is in agreement with outpatient Palliative support. This is much appropriate for ongoing discussions in addition to symptom management.  PMT will continue to support and follow as needed. Please call team line with urgent needs.  Symptom Management:  Scheduled Tylenol at this time (declining additional medications)   Palliative Prophylaxis:  Bowel Regimen and Frequent Pain Assessment  Additional Recommendations (Limitations, Scope, Preferences): Full Scope Treatment  Psycho-social/Spiritual:  Desire for further Chaplaincy support: no Additional Recommendations: Caregiving  Support/Resources and Referral to Community Resources   Prognosis:  Unable to determine  Discharge Planning:  Home with Palliative Services and home health.    Patient and her 3 daughters expressed understanding and was in agreement with this plan.   Time In: 1230 Time Out: 1320 Time Total: 50 min.  Visit consisted of counseling and education dealing with the complex and emotionally intense issues of symptom management and palliative care in the setting of serious and potentially life-threatening illness.Greater than 50%  of this time was spent counseling and coordinating care related to the above assessment and plan.  Signed by:  Alda Lea, AGPCNP-BC Palliative Medicine Team  Phone: [phone] Pager: [phone] Amion: Bjorn Pippin   Thank you for allowing the Palliative Medicine Team to assist in the care of this patient. Please utilize secure chat with additional questions, if there is no response within 30 minutes please call the above phone number. Palliative Medicine Team providers are available by phone from 7am to 5pm daily and can be reached through the team cell phone.  Should this patient require assistance outside of these hours, please call the patient's attending physician.

## 2020-10-19 NOTE — Evaluation (Signed)
Physical Therapy Evaluation Patient Details Name: Tiffany Owen MRN: [id number] DOB: 1961/03/08 Today's Date: 10/19/2020   History of Present Illness  60yo female admitted 10/18/20 c/o SOB and increasing back pain. Admitted with acute on chronic respiratory failure and back pain likely secondary to metastatic disease. PMH CA, COPD, obesity  Clinical Impression   Patient received in recliner, pleasant and cooperative with therapy. Does seem to have a bit of a delay in processing time as well as slight impulsivity but question if she is at/close to her baseline. Able to gait train house hold distances with close S/no device, but would definitely benefit from use of RW moving forward. VSS on 3LPM. Daughters present and are very supportive of her at home. Left sitting in recliner with all needs met, family present- will benefit from skilled HHPT f/u and DME as below.     Follow Up Recommendations Home health PT;Supervision/Assistance - 24 hour    Equipment Recommendations  Rolling walker with 5" wheels;Hospital bed;Other (comment) (shower seat; all bariatric equipment)    Recommendations for Other Services       Precautions / Restrictions Precautions Precautions: Fall;Other (comment) Precaution Comments: watch sats Restrictions Weight Bearing Restrictions: No      Mobility  Bed Mobility               General bed mobility comments: OOB in chair    Transfers Overall transfer level: Needs assistance Equipment used: None Transfers: Sit to/from Stand Sit to Stand: Supervision         General transfer comment: S for safety, no physical assist given beyond line management  Ambulation/Gait Ambulation/Gait assistance: Supervision Gait Distance (Feet): 20 Feet (78fx2) Assistive device: None Gait Pattern/deviations: Step-through pattern;Decreased step length - right;Decreased step length - left;Trunk flexed Gait velocity: decreased   General Gait Details: slow and mildly  unsteady without device, often reaches out for surrounding objects like end of bed or sink. Habitually flexes trunk but can straighten up when asked to. VSS on 3LPM O2.  Stairs            Wheelchair Mobility    Modified Rankin (Stroke Patients Only)       Balance Overall balance assessment: Mild deficits observed, not formally tested                                           Pertinent Vitals/Pain Pain Assessment: No/denies pain    Home Living Family/patient expects to be discharged to:: Private residence Living Arrangements: Children Available Help at Discharge: Family;Available 24 hours/day Type of Home: House Home Access: Stairs to enter Entrance Stairs-Rails: None Entrance Stairs-Number of Steps: 1 STE no rails Home Layout: Two level;Able to live on main level with bedroom/bathroom Home Equipment: None Additional Comments: 2LPM O2 at basline    Prior Function Level of Independence: Independent         Comments: no falls or close calls; has been weak since chemo and is mostly in home     Hand Dominance        Extremity/Trunk Assessment   Upper Extremity Assessment Upper Extremity Assessment: Defer to OT evaluation    Lower Extremity Assessment Lower Extremity Assessment: Generalized weakness    Cervical / Trunk Assessment Cervical / Trunk Assessment: Kyphotic  Communication   Communication: No difficulties  Cognition Arousal/Alertness: Awake/alert Behavior During Therapy: WFL for tasks assessed/performed;Impulsive Overall Cognitive Status: Within Functional  Limits for tasks assessed                                 General Comments: slightly impulsive and slow processing, question if this is baseline however      General Comments      Exercises     Assessment/Plan    PT Assessment Patient needs continued PT services  PT Problem List Decreased strength;Obesity;Decreased knowledge of use of DME;Decreased  activity tolerance;Decreased safety awareness;Decreased balance;Decreased mobility;Decreased coordination;Cardiopulmonary status limiting activity       PT Treatment Interventions DME instruction;Balance training;Gait training;Stair training;Functional mobility training;Patient/family education;Therapeutic activities;Therapeutic exercise    PT Goals (Current goals can be found in the Care Plan section)  Acute Rehab PT Goals Patient Stated Goal: go home PT Goal Formulation: With patient/family Time For Goal Achievement: 11/02/20 Potential to Achieve Goals: Good    Frequency Min 3X/week   Barriers to discharge        Co-evaluation               AM-PAC PT "6 Clicks" Mobility  Outcome Measure Help needed turning from your back to your side while in a flat bed without using bedrails?: A Little Help needed moving from lying on your back to sitting on the side of a flat bed without using bedrails?: A Little Help needed moving to and from a bed to a chair (including a wheelchair)?: A Little Help needed standing up from a chair using your arms (e.g., wheelchair or bedside chair)?: A Little Help needed to walk in hospital room?: A Little Help needed climbing 3-5 steps with a railing? : A Lot 6 Click Score: 17    End of Session Equipment Utilized During Treatment: Oxygen Activity Tolerance: Patient tolerated treatment well Patient left: in chair;with call bell/phone within reach;with family/visitor present Nurse Communication: Mobility status PT Visit Diagnosis: Muscle weakness (generalized) (M62.81);Difficulty in walking, not elsewhere classified (R26.2);Unsteadiness on feet (R26.81)    Time: 8590-9311 PT Time Calculation (min) (ACUTE ONLY): 18 min   Charges:   PT Evaluation $PT Eval Moderate Complexity: 1 Mod         Windell Norfolk, DPT, PN1   Supplemental Physical Therapist [place]    Pager [phone] Acute Rehab Office [phone]

## 2020-10-19 NOTE — Progress Notes (Signed)
Subjective:  Patient is off BiPAP up in chair.  Feeling well denies any chest pain or shortness of breath.  Objective:  Vital Signs in the last 24 hours: Temp:  [97.8 F (36.6 C)-98.4 F (36.9 C)] 98.4 F (36.9 C) (07/04 0721) Pulse Rate:  [81-103] 88 (07/04 0805) Resp:  [20-29] 20 (07/04 0805) BP: (100-143)/(60-99) 133/78 (07/04 0721) SpO2:  [76 %-99 %] 99 % (07/04 0805) FiO2 (%):  [45 %] 45 % (07/03 1530) Weight:  [527 kg] 123 kg (07/03 2205)  Intake/Output from previous day: 07/03 0701 - 07/04 0700 In: 120 [P.O.:120] Out: -  Intake/Output from this shift: No intake/output data recorded.  Physical Exam: Neck: no adenopathy, no carotid bruit, no JVD, and supple, symmetrical, trachea midline Lungs: Decreased breath sound at bases Heart: regular rate and rhythm and S1, S2 normal Extremities: extremities normal, atraumatic, no cyanosis or edema Neurologic: Grossly normal  Lab Results: Recent Labs    10/18/20 0728 10/18/20 1438 10/19/20 0122  WBC 19.6*  --  13.1*  HGB 14.7 15.0 12.9  PLT 206  --  173   Recent Labs    10/18/20 0728 10/18/20 1438 10/18/20 2237 10/19/20 0122  NA 140 139  --  136  K 4.2 4.3  --  4.2  CL 95*  --   --  95*  CO2 35*  --   --  35*  GLUCOSE 164*  --   --  114*  BUN 18  --   --  16  CREATININE 0.91  --  0.75 0.73   No results for input(s): TROPONINI in the last 72 hours.  Invalid input(s): CK, MB Hepatic Function Panel No results for input(s): PROT, ALBUMIN, AST, ALT, ALKPHOS, BILITOT, BILIDIR, IBILI in the last 72 hours. Recent Labs    10/18/20 2237  CHOL 93   No results for input(s): PROTIME in the last 72 hours.  Imaging: CT Head Wo Contrast  Result Date: 10/18/2020 CLINICAL DATA:  Mental status changes. EXAM: CT HEAD WITHOUT CONTRAST TECHNIQUE: Contiguous axial images were obtained from the base of the skull through the vertex without intravenous contrast. COMPARISON:  None. FINDINGS: Brain: There is no evidence for acute  hemorrhage, hydrocephalus, mass lesion, or abnormal extra-axial fluid collection. No definite CT evidence for acute infarction. Intravascular contrast evident secondary to recent CTA Chest, limiting assessment for subtle subarachnoid hemorrhage. Patchy low attenuation in the deep hemispheric and periventricular white matter is nonspecific, but likely reflects chronic microvascular ischemic demyelination. Vascular: Persistent enhancement of blood pool from recent CTA Chest . Skull: No evidence for fracture. No worrisome lytic or sclerotic lesion. Sinuses/Orbits: The visualized paranasal sinuses and mastoid air cells are clear. Visualized portions of the globes and intraorbital fat are unremarkable. Other: None. IMPRESSION: 1. No acute intracranial abnormality. 2. Atrophy with chronic small vessel white matter ischemic disease. Electronically Signed   By: Misty Stanley M.D.   On: 10/18/2020 13:27   CT Angio Chest PE W/Cm &/Or Wo Cm  Result Date: 10/18/2020 CLINICAL DATA:  60 year old female with shortness of breath and elevated D-dimer. EXAM: CT ANGIOGRAPHY CHEST WITH CONTRAST TECHNIQUE: Multidetector CT imaging of the chest was performed using the standard protocol during bolus administration of intravenous contrast. Multiplanar CT image reconstructions and MIPs were obtained to evaluate the vascular anatomy. CONTRAST:  174mL OMNIPAQUE IOHEXOL 350 MG/ML SOLN COMPARISON:  04/15/2018 CT.  10/18/2020 prior radiographs FINDINGS: Cardiovascular: Satisfactory opacification of the pulmonary arteries to the segmental level. No evidence of pulmonary embolism. Cardiomegaly  is noted. No thoracic aortic aneurysm or pericardial effusion identified. Mediastinum/Nodes: Enlarged LEFT hilar nodes are identified with a 1.7 cm index node (series 6: Image 47). Other UPPER limits of normal sized mediastinal lymph nodes are identified. The thyroid gland, trachea and esophagus are unremarkable. Lungs/Pleura: A 3 x 3.6 x 3.9 cm mass  versus masslike consolidation is noted within the posteromedial aspect of the LEFT LOWER lobe (8:50). Multiple primarily subcentimeter bilateral pulmonary nodules are identified, many which are new from 04/15/2018. Index nodules include a 1.4 x 1.4 x 2 cm LEFT apical nodule (8:15), a 8 mm LEFT UPPER lobe nodule (8:23) and 6 mm LEFT LOWER lobe nodule (8:62). Scattered areas of atelectasis within both lungs are identified. No pleural effusion or pneumothorax identified. Upper Abdomen: A nonspecific 1.8 cm hypodense splenic lesion (6:80) is noted. Probable hepatic steatosis identified. Musculoskeletal: New 3 mm lytic lesions within the LEFT 5th rib and LEFT 8th rib are identified) 8:35 and 8:60. A 6 mm area of sclerosis within the anterior RIGHT 5th rib is noted. No other new bony abnormalities are identified. Review of the MIP images confirms the above findings. IMPRESSION: 1. 3 x 3.6 x 3.9 cm mass versus masslike consolidation within the posteromedial LEFT LOWER lobe - suspicious for malignancy. Multiple bilateral pulmonary nodules and enlarged LEFT hilar lymph nodes worrisome for metastatic disease. 2. New 3 mm lytic lesions within the LEFT 5th rib and LEFT 8th rib - suspicious for metastatic disease. Indeterminate 6 mm sclerosis within the RIGHT 5th rib. 3. Indeterminate 1.8 cm splenic lesion. Consider elective MRI for further evaluation. 4. No evidence of pulmonary emboli or thoracic aortic aneurysm. 5. Cardiomegaly. 6. Probable hepatic steatosis. Electronically Signed   By: Margarette Canada M.D.   On: 10/18/2020 11:18   DG Chest Portable 1 View  Result Date: 10/18/2020 CLINICAL DATA:  Shortness of breath. EXAM: PORTABLE CHEST 1 VIEW COMPARISON:  None. FINDINGS: Mild cardiomegaly is noted. Mild left midlung subsegmental atelectasis is noted. No pneumothorax or significant pleural effusion is noted. Right lung is clear. Bony thorax is unremarkable. IMPRESSION: Mild left midlung subsegmental atelectasis.  Electronically Signed   By: Marijo Conception M.D.   On: 10/18/2020 08:13    Cardiac Studies:  Assessment/Plan:  Status post acute on chronic hypoxic hypercarbic respiratory failure precipitated by IV morphine Possible mild acute congestive heart failure Minimally elevated high-sensitivity troponin secondary to above doubt significant MI Metastatic lung carcinoma with new mets to ribs now Hypertension Hyperlipidemia History of tobacco abuse Morbid obesity Probably obstructive sleep apnea Plan Okay to discharge from cardiac point of view. 2D echo still pending, can be done as outpatient as needed.  LOS: 0 days    Tiffany Owen 10/19/2020, 9:00 AM

## 2020-10-19 NOTE — TOC Initial Note (Signed)
Transition of Care [facility]) - Initial/Assessment Note    Patient Details  Name: Tiffany Owen MRN: [id number] Date of Birth: [birth year]  [facility] CM/SW Contact:    Pollie Friar, RN Phone Number: 10/19/2020, 1:28 PM  Clinical Narrative:                 Patient recently moved here from [place] into her daughters home. She has oxygen but they are unsure of the company and she has no portable tanks.  Daughter to get CM the name of the company for tomorrow.  Family requesting hospital bed, walker, wheelchair for home. CM has sent orders to [place] with daughters number to arrange delivery. MD wanting CPAP for home. Per daughter/ pt she has a sleep study a few months ago in [place]. Daughter to get MD name and update CM tomorrow so we can get the results.  Daughter in agreement with using [facility] for palliative care at d/c. CM will place referral to [facility].  TOC following.    Expected Discharge Plan: [place] Barriers to Discharge: Continued Medical Work up   Patient Goals and CMS Choice   CMS Medicare.gov Compare Post Acute Care list provided to:: Patient Choice offered to / list presented to : Patient, Adult Children  Expected Discharge Plan and Services Expected Discharge Plan: [place]   Discharge Planning Services: CM Consult Post Acute Care Choice: Durable Medical Equipment, Home Health Living arrangements for the past 2 months: [place]                 DME Arranged: Hospital bed, Walker wide, Wheelchair manual DME Agency: [employer] Date DME Agency Contacted: 10/19/20   Representative spoke with at DME Agency: Freda Munro            Prior Living Arrangements/Services Living arrangements for the past 2 months: Single Family Home Lives with:: Adult Children Patient language and need for interpreter reviewed:: Yes Do you feel safe going back to the place where you live?: Yes      Need for  Family Participation in Patient Care: Yes (Comment) Care giver support system in place?: Yes (comment) Current home services: DME (oxygen) Criminal Activity/Legal Involvement Pertinent to Current Situation/Hospitalization: No - Comment as needed  Activities of Daily Living Home Assistive Devices/Equipment: None (per patient) ADL Screening (condition at time of admission) Patient's cognitive ability adequate to safely complete daily activities?: Yes Is the patient deaf or have difficulty hearing?: No Does the patient have difficulty seeing, even when wearing glasses/contacts?: No Does the patient have difficulty concentrating, remembering, or making decisions?: No Patient able to express need for assistance with ADLs?: Yes Does the patient have difficulty dressing or bathing?: Yes Independently performs ADLs?: No Communication: Independent Dressing (OT): Needs assistance Is this a change from baseline?: Pre-admission baseline Grooming: Needs assistance Is this a change from baseline?: Pre-admission baseline Feeding: Needs assistance Is this a change from baseline?: Pre-admission baseline Bathing: Needs assistance Is this a change from baseline?: Pre-admission baseline Toileting: Needs assistance Is this a change from baseline?: Pre-admission baseline In/Out Bed: Needs assistance Is this a change from baseline?: Pre-admission baseline Walks in Home: Independent Does the patient have difficulty walking or climbing stairs?: Yes Weakness of Legs: Both Weakness of Arms/Hands: None  Permission Sought/Granted                  Emotional Assessment Appearance:: Appears stated age Attitude/Demeanor/Rapport: Engaged Affect (typically observed): Accepting Orientation: :  Oriented to Self, Oriented to Place, Oriented to  Time, Oriented to Situation   Psych Involvement: No (comment)  Admission diagnosis:  SOB (shortness of breath) [R06.02] Elevated troponin [R77.8] Malignant  neoplasm of lung, unspecified laterality, unspecified part of lung (HCC) [C34.90] Acute on chronic respiratory failure with hypoxia and hypercapnia (HCC) [J96.21, J96.22] Acute on chronic respiratory acidosis [E87.2] Patient Active Problem List   Diagnosis Date Noted   Acute on chronic respiratory acidosis 10/18/2020   PCP:  Lujean Amel, MD Pharmacy:   [facility] [place], [place] AT [facility] OF [address] [place] [address] Phone: [phone] Fax: [phone]     Social Determinants of Health (SDOH) Interventions    Readmission Risk Interventions No flowsheet data found.

## 2020-10-19 NOTE — Progress Notes (Signed)
Eval complete, documentation pending. Did well with PT- recommend HHPT, RW (bariatric), shower seat (bariatric), and hospital bed at DC.  Windell Norfolk, DPT, PN1   Supplemental Physical Therapist [facility]    Pager [phone] Acute Rehab Office [phone]

## 2020-10-19 NOTE — Progress Notes (Signed)
Hydrologist [facility])  Hospital Liaison: RN note       Notified by [facility] manager of patient/family request for [facility] Palliative services at home after discharge.            [place] Palliative team will follow up with patient after discharge.       Please call with any hospice or palliative related questions.       Thank you for this referral.       Clementeen Hoof, RN, BSN [place] (listed on [place] under Hospice and [place] [facility] [phone]  [phone]

## 2020-10-19 NOTE — Progress Notes (Signed)
  Echocardiogram 2D Echocardiogram has been performed.  Tiffany Owen 10/19/2020, 10:26 AM

## 2020-10-19 NOTE — Progress Notes (Addendum)
PROGRESS NOTE        PATIENT DETAILS Name: Tiffany Owen Age: 60 y.o. Sex: female Date of Birth: [birth year] Admit Date: 10/18/2020 Admitting Physician Vashti Hey, MD YJE:[id number], Dibas, MD  Brief Narrative: Patient is a 61 y.o. female metastatic lung cancer-s/p first cycle of chemotherapy last week, OSA noncompliant to CPAP, COPD on home O2 nocturnally-presented to the ED with worsening right upper back pain and shortness of breath.  She was initially hypoxic and required 15 L of oxygen but was gradually titrated back down to her usual oxygen regimen of 3 L.  She continued to have severe right upper back pain-and was given 4 mg of IV morphine in the hospital following which became somnolent-she was found to have acute on chronic hypercarbic respiratory failure and was subsequently started on BiPAP.  The hospitalist service was then asked to admit this patient for further evaluation and treatment.  Significant events: 7/3>> presented with shortness of breath-severe right upper back pain-given IV morphine-developed hypercarbia requiring BiPAP.  Significant studies: 7/3>> CTA chest: No PE, mass in the left lower lobe suspicious for malignancy, multiple bilateral nodules, enlarged left hilar lymph nodes, new cystic lesion in the left fifth rib, eighth rib and right fifth rib. 7/3>> CT head: No acute intracranial abnormality. 7/4>> TTE: EF 55-60%, no regional wall motion abnormalities  Antimicrobial therapy: None  Microbiology data: 7/3>> COVID/influenza PCR: Negative  Procedures : None  Consults: Palliative care  DVT Prophylaxis : enoxaparin (LOVENOX) injection 40 mg Start: 10/18/20 1800  Subjective: Liberated off the BiPAP last night-on 3 L of oxygen-she is still somewhat drowsy-but awake/alert and able to answer questions.  She continues to have severe pain in her right upper back.  Per family at bedside-has significant issues with ambulation for  the past 1 week or so-she now can only take a few steps before she has to stop/crouch/ bend over because of pain in her back.  Assessment/Plan: Acute on chronic hypercarbic respiratory failure: Hypercarbia likely due to IV morphine-requiring BiPAP-liberated off BiPAP last night.  However she has a history of OSA-and has COPD and is on home O2-suspect some amount of chronic hypercarbic respiratory failure at baseline.  Difficult situation-she has severe back pain-likely cancer pain-and suspect is going to require some amount of narcotics to manage her pain.Given tenuous situation with underlying OSA/COPD/morbid obesity-and risk of developing hypercarbia with narcotics (opiate nave)-suspect that she will require to be started on a pain management regimen while inpatient-so that we can monitor her closely for worsening hypercarbia-once her pain regimen is optimized-we can then plan for a safe discharge home.    Have asked case management to assist in seeing if we can arrange for a CPAP/BiPAP on discharge.  Have asked nursing staff to continue with CPAP/BiPAP at bedside-we plan to use it nightly-and as needed for sleep.  Acute metabolic encephalopathy due to hypercarbia: Markedly improved-as noted above-hypercarbia was from IV morphine.  See concerns for recurrence/worsening hypercarbia-re narcotics  Minimally elevated troponins: Doubt any clinical significance-Echo with preserved EF-no further recommendations from cardiologist  Intractable back pain: Mostly in the right upper back-start scheduled Tylenol, use ibuprofen as needed for mild pain, use oxycodone for moderate pain-Place Lidoderm patch.  If no response-start steroids.  Have added as needed Narcan in case she develops hypercarbia and needs opiate reversal.  Have consulted palliative care for assistance as  well.  Very difficult situation-see above discussion-she was just started on hydrocodone/Tylenol last week-is still very much opiate  nave-continued to have pain in spite of being on hydrocodone/Tylenol last week.  Given severity of pain-needs a pain regimen that needs to be optimized while she is inpatient before consideration of discharge given her underlying issues with OSA/OHS/morbid obesity/COPD-and risk for recurrent worsening hypercarbia.  HTN: BP stable-continue Coreg/irbesartan, HCTZ.  Follow and adjust.  DM-2 (A1c 6.5 on 7/3): CBG stable-continue SSI-resume metformin on discharge.  Recent Labs    10/19/20 0719 10/19/20 1127  GLUCAP 100* 101*    Chronic hypoxic respiratory failure: On 3 L of oxygen at home-back on usual regimen.  COPD: No exacerbation-continue bronchodilators.  OSA: Continue CPAP while in the hospital-I have spoken with CM regarding trying to arrange for CPAP on discharge.  Worsening debility/deconditioning: Per family-patient only able to take 5-6 steps over the past 1 week-primarily due to pain in her upper back.  Needs PT/OT eval to ensure safe disposition.  Obesity: Estimated body mass index is 56.67 kg/m as calculated from the following:   Height as of this encounter: 4\' 10"  (1.473 m).   Weight as of this encounter: 123 kg.    Diet: Diet Order             Diet Carb Modified Fluid consistency: Thin; Room service appropriate? Yes  Diet effective now                    Code Status: Full code   Family Communication: Daughter at bedside.  Disposition Plan: Status is: Observation  The patient will require care spanning > 2 midnights and should be moved to inpatient because: Inpatient level of care appropriate due to severity of illness  Dispo: The patient is from: Home              Anticipated d/c is to: Home              Patient currently is not medically stable to d/c.   Difficult to place patient No   Barriers to Discharge: Needs pain regimen optimized before consideration of discharge-see above discussion.  High risk for worsening hypercarbia.  Antimicrobial  agents: Anti-infectives (From admission, onward)    Start     Dose/Rate Route Frequency Ordered Stop   10/19/20 0000  azithromycin (ZITHROMAX) tablet 250 mg       Note to Pharmacy: Take 500 mg by mouth on day one, then decrease to 250 mg once a day on days 2-5     250 mg Oral  Once 10/18/20 1714 10/18/20 2243        Time spent: 35 minutes-Greater than 50% of this time was spent in counseling, explanation of diagnosis, planning of further management, and coordination of care.  MEDICATIONS: Scheduled Meds:  carvedilol  12.5 mg Oral BID WC   enoxaparin (LOVENOX) injection  40 mg Subcutaneous Q24H   hydrochlorothiazide  25 mg Oral Daily   insulin aspart  0-15 Units Subcutaneous TID WC   irbesartan  75 mg Oral Daily   sodium chloride flush  3 mL Intravenous Q12H   Continuous Infusions:  sodium chloride     PRN Meds:.sodium chloride, acetaminophen **OR** acetaminophen, ipratropium-albuterol, polyethylene glycol, prochlorperazine, sodium chloride flush   PHYSICAL EXAM: Vital signs: Vitals:   10/19/20 0355 10/19/20 0721 10/19/20 0805 10/19/20 1134  BP: 116/73 133/78  124/82  Pulse: 95 95 88 85  Resp: (!) 21 20 20  (!) 21  Temp:  98 F (36.7 C) 98.4 F (36.9 C)  98.1 F (36.7 C)  TempSrc:  Axillary  Oral  SpO2: 94% 98% 99% 93%  Weight:      Height:       Filed Weights   10/18/20 2155 10/18/20 2205  Weight: 123 kg 123 kg   Body mass index is 56.67 kg/m.   Gen Exam:Alert awake-not in any distress HEENT:atraumatic, normocephalic Chest: B/L clear to auscultation anteriorly CVS:S1S2 regular Abdomen:soft non tender, non distended Extremities:no edema Neurology: Non focal Skin: no rash  I have personally reviewed following labs and imaging studies  LABORATORY DATA: CBC: Recent Labs  Lab 10/18/20 0728 10/18/20 1438 10/19/20 0122  WBC 19.6*  --  13.1*  HGB 14.7 15.0 12.9  HCT 46.8* 44.0 41.9  MCV 94.9  --  96.1  PLT 206  --  782    Basic Metabolic  Panel: Recent Labs  Lab 10/18/20 0728 10/18/20 1438 10/18/20 2237 10/19/20 0122  NA 140 139  --  136  K 4.2 4.3  --  4.2  CL 95*  --   --  95*  CO2 35*  --   --  35*  GLUCOSE 164*  --   --  114*  BUN 18  --   --  16  CREATININE 0.91  --  0.75 0.73  CALCIUM 9.1  --   --  8.8*    GFR: Estimated Creatinine Clearance: 85.9 mL/min (by C-G formula based on SCr of 0.73 mg/dL).  Liver Function Tests: No results for input(s): AST, ALT, ALKPHOS, BILITOT, PROT, ALBUMIN in the last 168 hours. No results for input(s): LIPASE, AMYLASE in the last 168 hours. No results for input(s): AMMONIA in the last 168 hours.  Coagulation Profile: Recent Labs  Lab 10/18/20 1126  INR 1.2    Cardiac Enzymes: No results for input(s): CKTOTAL, CKMB, CKMBINDEX, TROPONINI in the last 168 hours.  BNP (last 3 results) No results for input(s): PROBNP in the last 8760 hours.  Lipid Profile: Recent Labs    10/18/20 2237  CHOL 93  HDL 27*  LDLCALC 50  TRIG 80  CHOLHDL 3.4    Thyroid Function Tests: No results for input(s): TSH, T4TOTAL, FREET4, T3FREE, THYROIDAB in the last 72 hours.  Anemia Panel: No results for input(s): VITAMINB12, FOLATE, FERRITIN, TIBC, IRON, RETICCTPCT in the last 72 hours.  Urine analysis: No results found for: COLORURINE, APPEARANCEUR, LABSPEC, PHURINE, GLUCOSEU, HGBUR, BILIRUBINUR, KETONESUR, PROTEINUR, UROBILINOGEN, NITRITE, LEUKOCYTESUR  Sepsis Labs: Lactic Acid, Venous No results found for: LATICACIDVEN  MICROBIOLOGY: Recent Results (from the past 240 hour(s))  Resp Panel by RT-PCR (Flu A&B, Covid) Nasopharyngeal Swab     Status: None   Collection Time: 10/18/20 11:20 AM   Specimen: Nasopharyngeal Swab; Nasopharyngeal(NP) swabs in vial transport medium  Result Value Ref Range Status   SARS Coronavirus 2 by RT PCR NEGATIVE NEGATIVE Final    Comment: (NOTE) SARS-CoV-2 target nucleic acids are NOT DETECTED.  The SARS-CoV-2 RNA is generally detectable in upper  respiratory specimens during the acute phase of infection. The lowest concentration of SARS-CoV-2 viral copies this assay can detect is 138 copies/mL. A negative result does not preclude SARS-Cov-2 infection and should not be used as the sole basis for treatment or other patient management decisions. A negative result may occur with  improper specimen collection/handling, submission of specimen other than nasopharyngeal swab, presence of viral mutation(s) within the areas targeted by this assay, and inadequate number of viral copies(<138 copies/mL). A  negative result must be combined with clinical observations, patient history, and epidemiological information. The expected result is Negative.  Fact Sheet for Patients:  EntrepreneurPulse.com.au  Fact Sheet for Healthcare Providers:  IncredibleEmployment.be  This test is no t yet approved or cleared by the [place] FDA and  has been authorized for detection and/or diagnosis of SARS-CoV-2 by FDA under an Emergency Use Authorization (EUA). This EUA will remain  in effect (meaning this test can be used) for the duration of the COVID-19 declaration under Section 564(b)(1) of the Act, 21 U.S.C.section 360bbb-3(b)(1), unless the authorization is terminated  or revoked sooner.       Influenza A by PCR NEGATIVE NEGATIVE Final   Influenza B by PCR NEGATIVE NEGATIVE Final    Comment: (NOTE) The Xpert Xpress SARS-CoV-2/FLU/RSV plus assay is intended as an aid in the diagnosis of influenza from Nasopharyngeal swab specimens and should not be used as a sole basis for treatment. Nasal washings and aspirates are unacceptable for Xpert Xpress SARS-CoV-2/FLU/RSV testing.  Fact Sheet for Patients: EntrepreneurPulse.com.au  Fact Sheet for Healthcare Providers: IncredibleEmployment.be  This test is not yet approved or cleared by the [place] FDA and has been  authorized for detection and/or diagnosis of SARS-CoV-2 by FDA under an Emergency Use Authorization (EUA). This EUA will remain in effect (meaning this test can be used) for the duration of the COVID-19 declaration under Section 564(b)(1) of the Act, 21 U.S.C. section 360bbb-3(b)(1), unless the authorization is terminated or revoked.  Performed at [facility], [place] [address] [place], [place] [address]     RADIOLOGY STUDIES/RESULTS: CT Head Wo Contrast  Result Date: 10/18/2020 CLINICAL DATA:  Mental status changes. EXAM: CT HEAD WITHOUT CONTRAST TECHNIQUE: Contiguous axial images were obtained from the base of the skull through the vertex without intravenous contrast. COMPARISON:  None. FINDINGS: Brain: There is no evidence for acute hemorrhage, hydrocephalus, mass lesion, or abnormal extra-axial fluid collection. No definite CT evidence for acute infarction. Intravascular contrast evident secondary to recent CTA Chest, limiting assessment for subtle subarachnoid hemorrhage. Patchy low attenuation in the deep hemispheric and periventricular white matter is nonspecific, but likely reflects chronic microvascular ischemic demyelination. Vascular: Persistent enhancement of blood pool from recent CTA Chest . Skull: No evidence for fracture. No worrisome lytic or sclerotic lesion. Sinuses/Orbits: The visualized paranasal sinuses and mastoid air cells are clear. Visualized portions of the globes and intraorbital fat are unremarkable. Other: None. IMPRESSION: 1. No acute intracranial abnormality. 2. Atrophy with chronic small vessel white matter ischemic disease. Electronically Signed   By: Misty Stanley M.D.   On: 10/18/2020 13:27   CT Angio Chest PE W/Cm &/Or Wo Cm  Result Date: 10/18/2020 CLINICAL DATA:  60 year old female with shortness of breath and elevated D-dimer. EXAM: CT ANGIOGRAPHY CHEST WITH CONTRAST TECHNIQUE: Multidetector CT imaging of the chest was performed using the standard  protocol during bolus administration of intravenous contrast. Multiplanar CT image reconstructions and MIPs were obtained to evaluate the vascular anatomy. CONTRAST:  127mL OMNIPAQUE IOHEXOL 350 MG/ML SOLN COMPARISON:  04/15/2018 CT.  10/18/2020 prior radiographs FINDINGS: Cardiovascular: Satisfactory opacification of the pulmonary arteries to the segmental level. No evidence of pulmonary embolism. Cardiomegaly is noted. No thoracic aortic aneurysm or pericardial effusion identified. Mediastinum/Nodes: Enlarged LEFT hilar nodes are identified with a 1.7 cm index node (series 6: Image 47). Other UPPER limits of normal sized mediastinal lymph nodes are identified. The thyroid gland, trachea and esophagus are unremarkable. Lungs/Pleura: A 3 x 3.6 x  3.9 cm mass versus masslike consolidation is noted within the posteromedial aspect of the LEFT LOWER lobe (8:50). Multiple primarily subcentimeter bilateral pulmonary nodules are identified, many which are new from 04/15/2018. Index nodules include a 1.4 x 1.4 x 2 cm LEFT apical nodule (8:15), a 8 mm LEFT UPPER lobe nodule (8:23) and 6 mm LEFT LOWER lobe nodule (8:62). Scattered areas of atelectasis within both lungs are identified. No pleural effusion or pneumothorax identified. Upper Abdomen: A nonspecific 1.8 cm hypodense splenic lesion (6:80) is noted. Probable hepatic steatosis identified. Musculoskeletal: New 3 mm lytic lesions within the LEFT 5th rib and LEFT 8th rib are identified) 8:35 and 8:60. A 6 mm area of sclerosis within the anterior RIGHT 5th rib is noted. No other new bony abnormalities are identified. Review of the MIP images confirms the above findings. IMPRESSION: 1. 3 x 3.6 x 3.9 cm mass versus masslike consolidation within the posteromedial LEFT LOWER lobe - suspicious for malignancy. Multiple bilateral pulmonary nodules and enlarged LEFT hilar lymph nodes worrisome for metastatic disease. 2. New 3 mm lytic lesions within the LEFT 5th rib and LEFT 8th  rib - suspicious for metastatic disease. Indeterminate 6 mm sclerosis within the RIGHT 5th rib. 3. Indeterminate 1.8 cm splenic lesion. Consider elective MRI for further evaluation. 4. No evidence of pulmonary emboli or thoracic aortic aneurysm. 5. Cardiomegaly. 6. Probable hepatic steatosis. Electronically Signed   By: Margarette Canada M.D.   On: 10/18/2020 11:18   DG Chest Portable 1 View  Result Date: 10/18/2020 CLINICAL DATA:  Shortness of breath. EXAM: PORTABLE CHEST 1 VIEW COMPARISON:  None. FINDINGS: Mild cardiomegaly is noted. Mild left midlung subsegmental atelectasis is noted. No pneumothorax or significant pleural effusion is noted. Right lung is clear. Bony thorax is unremarkable. IMPRESSION: Mild left midlung subsegmental atelectasis. Electronically Signed   By: Marijo Conception M.D.   On: 10/18/2020 08:13   ECHOCARDIOGRAM COMPLETE  Result Date: 10/19/2020    ECHOCARDIOGRAM REPORT   Patient Name:   Tiffany Owen Date of Exam: 10/19/2020 Medical Rec #:  [id number]  Height:       58.0 in Accession #:    [id number] Weight:       271.2 lb Date of Birth:  [birth year]  BSA:          2.073 m Patient Age:    38 years   BP:           138/78 mmHg Patient Gender: F          HR:           95 bpm. Exam Location:  Inpatient Procedure: 2D Echo, Cardiac Doppler and Color Doppler Indications:     Congestive heart failure  History:         Patient has prior history of Echocardiogram examinations, most                  recent 04/16/2018. COPD; Risk Factors:Hypertension,                  Dyslipidemia, Former Smoker and Sleep Apnea.  Sonographer:     Clayton Lefort RDCS (AE) Referring Phys:  [id number] [place] Diagnosing Phys: Charolette Forward MD IMPRESSIONS  1. Left ventricular ejection fraction, by estimation, is 55 to 60%. The left ventricle has normal function. The left ventricle has no regional wall motion abnormalities. Left ventricular diastolic parameters were normal.  2. Right ventricular systolic function is  normal. The right ventricular size is normal. There  is mildly elevated pulmonary artery systolic pressure.  3. There is no evidence of cardiac tamponade.  4. The mitral valve is normal in structure. Trivial mitral valve regurgitation.  5. The aortic valve is normal in structure. Aortic valve regurgitation is not visualized. No aortic stenosis is present.  6. The inferior vena cava is normal in size with greater than 50% respiratory variability, suggesting right atrial pressure of 3 mmHg. FINDINGS  Left Ventricle: Left ventricular ejection fraction, by estimation, is 55 to 60%. The left ventricle has normal function. The left ventricle has no regional wall motion abnormalities. The left ventricular internal cavity size was normal in size. There is  no left ventricular hypertrophy. Left ventricular diastolic parameters were normal. Right Ventricle: The right ventricular size is normal. No increase in right ventricular wall thickness. Right ventricular systolic function is normal. There is mildly elevated pulmonary artery systolic pressure. The tricuspid regurgitant velocity is 2.99  m/s, and with an assumed right atrial pressure of 3 mmHg, the estimated right ventricular systolic pressure is 81.1 mmHg. Left Atrium: Left atrial size was normal in size. Right Atrium: Right atrial size was normal in size. Pericardium: Trivial pericardial effusion is present. There is no evidence of cardiac tamponade. Mitral Valve: The mitral valve is normal in structure. Trivial mitral valve regurgitation. MV peak gradient, 8.0 mmHg. The mean mitral valve gradient is 3.0 mmHg. Tricuspid Valve: The tricuspid valve is normal in structure. Tricuspid valve regurgitation is trivial. Aortic Valve: The aortic valve is normal in structure. Aortic valve regurgitation is not visualized. No aortic stenosis is present. Aortic valve mean gradient measures 7.0 mmHg. Aortic valve peak gradient measures 13.0 mmHg. Aortic valve area, by VTI measures 2.10  cm. Pulmonic Valve: The pulmonic valve was normal in structure. Pulmonic valve regurgitation is not visualized. Aorta: The aortic root is normal in size and structure. Venous: The inferior vena cava is normal in size with greater than 50% respiratory variability, suggesting right atrial pressure of 3 mmHg. IAS/Shunts: No atrial level shunt detected by color flow Doppler.  LEFT VENTRICLE PLAX 2D LVIDd:         3.70 cm  Diastology LVIDs:         2.30 cm  LV e' medial:    8.81 cm/s LV PW:         1.40 cm  LV E/e' medial:  13.2 LV IVS:        1.00 cm  LV e' lateral:   9.36 cm/s LVOT diam:     1.90 cm  LV E/e' lateral: 12.4 LV SV:         65 LV SV Index:   32 LVOT Area:     2.84 cm  RIGHT VENTRICLE             IVC RV Basal diam:  3.30 cm     IVC diam: 1.80 cm RV S prime:     14.60 cm/s TAPSE (M-mode): 2.9 cm LEFT ATRIUM             Index       RIGHT ATRIUM           Index LA diam:        4.00 cm 1.93 cm/m  RA Area:     16.40 cm LA Vol (A2C):   76.0 ml 36.67 ml/m RA Volume:   47.60 ml  22.97 ml/m LA Vol (A4C):   68.8 ml 33.19 ml/m LA Biplane Vol: 74.7 ml 36.04 ml/m  AORTIC VALVE  AV Area (Vmax):    2.17 cm AV Area (Vmean):   2.08 cm AV Area (VTI):     2.10 cm AV Vmax:           180.00 cm/s AV Vmean:          127.000 cm/s AV VTI:            0.312 m AV Peak Grad:      13.0 mmHg AV Mean Grad:      7.0 mmHg LVOT Vmax:         138.00 cm/s LVOT Vmean:        93.200 cm/s LVOT VTI:          0.231 m LVOT/AV VTI ratio: 0.74  AORTA Ao Root diam: 3.20 cm Ao Asc diam:  3.60 cm MITRAL VALVE                TRICUSPID VALVE MV Area (PHT): 2.64 cm     TR Peak grad:   35.8 mmHg MV Area VTI:   1.96 cm     TR Vmax:        299.00 cm/s MV Peak grad:  8.0 mmHg MV Mean grad:  3.0 mmHg     SHUNTS MV Vmax:       1.41 m/s     Systemic VTI:  0.23 m MV Vmean:      86.6 cm/s    Systemic Diam: 1.90 cm MV Decel Time: 287 msec MV E velocity: 116.00 cm/s MV A velocity: 128.00 cm/s MV E/A ratio:  0.91 Charolette Forward MD Electronically signed by  Charolette Forward MD Signature Date/Time: 10/19/2020/10:36:35 AM    Final      LOS: 0 days   Oren Binet, MD  [facility]    To contact the attending provider between 7A-7P or the covering provider during after hours 7P-7A, please log into the web site [email or web address] and access using universal [place] password for that web site. If you do not have the password, please call the hospital operator.  10/19/2020, 12:34 PM

## 2020-10-20 ENCOUNTER — Encounter: Payer: Self-pay | Admitting: *Deleted

## 2020-10-20 ENCOUNTER — Encounter (HOSPITAL_COMMUNITY): Payer: Self-pay | Admitting: Internal Medicine

## 2020-10-20 LAB — GLUCOSE, CAPILLARY
Glucose-Capillary: 84 mg/dL (ref 70–99)
Glucose-Capillary: 85 mg/dL (ref 70–99)
Glucose-Capillary: 95 mg/dL (ref 70–99)

## 2020-10-20 MED ORDER — OXYCODONE HCL 5 MG PO TABS: 5.0000 mg | ORAL_TABLET | Freq: Three times a day (TID) | ORAL | 0 refills | Status: AC | PRN

## 2020-10-20 MED ORDER — LIDOCAINE 5 % EX PTCH
1.0000 | MEDICATED_PATCH | CUTANEOUS | 0 refills | Status: AC
Start: 2020-10-20 — End: ?

## 2020-10-20 MED ORDER — PNEUMOCOCCAL VAC POLYVALENT 25 MCG/0.5ML IJ INJ
0.5000 mL | INJECTION | INTRAMUSCULAR | Status: AC
  Administered 2020-10-20: 0.5 mL via INTRAMUSCULAR
  Filled 2020-10-20: qty 0.5

## 2020-10-20 MED ORDER — ACETAMINOPHEN 500 MG PO TABS: 1000.0000 mg | ORAL_TABLET | Freq: Three times a day (TID) | ORAL | 0 refills | Status: AC

## 2020-10-20 MED ORDER — IBUPROFEN 800 MG PO TABS: 800.0000 mg | ORAL_TABLET | Freq: Four times a day (QID) | ORAL | 0 refills | Status: DC | PRN

## 2020-10-20 NOTE — Therapy (Signed)
Occupational Therapy Evaluation Patient Details Name: Tiffany Owen MRN: [id number] DOB: 09-11-1960 Today's Date: 10/20/2020    History of Present Illness 60yo female admitted 10/18/20 c/o SOB and increasing back pain. Admitted with acute on chronic respiratory failure and back pain likely secondary to metastatic disease. PMH CA, COPD, obesity   Clinical Impression   Pt admitted as above with supportive family able to provide 24/7 PRN assist. She appears to be close to her baseline level with ADL's and functional mobility however she is slow to process and requires rest breaks with functional mobility in room using RW. She was on 5L O2 throughout session today and verbal education [id number] conservation, bariatric 3:1 recommended and possible long handled a/e for lower body ADL's and pericare was discussed w/ pt. Will follow acutely to assist in maximizing independence.    Follow Up Recommendations  Home health OT;Supervision - Intermittent    Equipment Recommendations  3 in 1 bedside commode (Bariatric 3:1)    Recommendations for Other Services       Precautions / Restrictions Precautions Precautions: Fall;Other (comment) Precaution Comments: Monitor Sats Restrictions Weight Bearing Restrictions: No      Mobility Bed Mobility Overal bed mobility: Needs Assistance Bed Mobility: Supine to Sit     Supine to sit: Supervision;HOB elevated          Transfers Overall transfer level: Needs assistance Equipment used: Rolling walker (2 wheeled) Transfers: Sit to/from [facility] Sit to Stand: Supervision Stand pivot transfers: Supervision       General transfer comment: S for safety, no physical assist given beyond line management    Balance Overall balance assessment: Mild deficits observed, not formally tested                                         ADL either performed or assessed with clinical judgement   ADL Overall ADL's : Needs  assistance/impaired Eating/Feeding: Modified independent   Grooming: Set up;Wash/dry hands;Wash/dry face;Sitting   Upper Body Bathing: Set up;Sitting   Lower Body Bathing: Minimal assistance;Sit to/from stand;Sitting/lateral leans   Upper Body Dressing : Set up;Modified independent;Sitting   Lower Body Dressing: Minimal assistance;Sitting/lateral leans;Sit to/from stand   Toilet Transfer: Supervision/safety;Ambulation;RW;Requires wide/bariatric   Toileting- Water quality scientist and Hygiene: Min guard;Sitting/lateral lean;Sit to/from stand   Tub/ Banker:  (TBA)   Functional mobility during ADLs: Supervision/safety;Cueing for safety;Cueing for sequencing;Rolling walker General ADL Comments: Pt tesnds to be impulsive with RW, vc's, tc's for safety and sequencing with lines during functional mobility in room     Vision   Vision Assessment?: No apparent visual deficits     Perception     Praxis      Pertinent Vitals/Pain Pain Assessment: No/denies pain     Hand Dominance Right   Extremity/Trunk Assessment Upper Extremity Assessment Upper Extremity Assessment: Overall WFL for tasks assessed;Generalized weakness   Lower Extremity Assessment Lower Extremity Assessment: Defer to PT evaluation   Cervical / Trunk Assessment Cervical / Trunk Assessment: Kyphotic   Communication Communication Communication: No difficulties   Cognition Arousal/Alertness: Awake/alert Behavior During Therapy: WFL for tasks assessed/performed;Impulsive Overall Cognitive Status: Within Functional Limits for tasks assessed                                 General Comments: slightly impulsive and slow processing, question if  this is baseline however   General Comments       Exercises     Shoulder Instructions      Home Living Family/patient expects to be discharged to:: Private residence Living Arrangements: Children Available Help at Discharge: Family;Available  24 hours/day Type of Home: House Home Access: Stairs to enter [employer] of Steps: 1 STE no rails Entrance Stairs-Rails: None Home Layout: Two level;Able to live on main level with bedroom/bathroom Alternate Level Stairs-Number of Steps: 20 steps with L ascending but can sleep downstairs Alternate Level Stairs-Rails: Left Bathroom Shower/Tub: Tub/shower unit (On second fl;oor per pt report. Pt states she sponge bathes in 1/2 bath downstairs)   Bathroom Toilet: Handicapped height Bathroom Accessibility:  (Unclear, pt states that she does not use AD at home and does not know)   Home Equipment: None   Additional Comments: 2LPM O2 at basline      Prior Functioning/Environment Level of Independence: Independent (W/ PRN assist from children)        Comments: no falls or close calls; has been weak since chemo and is mostly in home        OT Problem List: Decreased strength;Decreased activity tolerance;Impaired balance (sitting and/or standing);Decreased knowledge of use of DME or AE;Obesity      OT Treatment/Interventions: Self-care/ADL training;Energy conservation;DME and/or AE instruction;Therapeutic activities;Patient/family education    OT Goals(Current goals can be found in the care plan section) Acute Rehab OT Goals Patient Stated Goal: Go home today Time For Goal Achievement: 11/03/20 Potential to Achieve Goals: Good ADL Goals Pt Will Perform Grooming: with modified independence;sitting;standing Pt Will Perform Lower Body Bathing: with supervision;sitting/lateral leans;sit to/from stand;with adaptive equipment Pt Will Perform Lower Body Dressing: with supervision;with caregiver independent in assisting;with adaptive equipment;sitting/lateral leans;sit to/from stand Pt Will Transfer to Toilet: with modified independence;stand pivot transfer;bedside commode Pt Will Perform Toileting - Clothing Manipulation and hygiene: with supervision;with min guard assist;with  caregiver independent in assisting;sitting/lateral leans;sit to/from stand;with adaptive equipment Pt Will Perform Tub/Shower Transfer: Tub transfer;3 in 1;with min assist;with caregiver independent in assisting  OT Frequency: Min 2X/week   Barriers to D/C:            Co-evaluation              AM-PAC OT "6 Clicks" Daily Activity     Outcome Measure Help from another person eating meals?: None Help from another person taking care of personal grooming?: A Little Help from another person toileting, which includes using toliet, bedpan, or urinal?: A Little Help from another person bathing (including washing, rinsing, drying)?: A Little Help from another person to put on and taking off regular upper body clothing?: None Help from another person to put on and taking off regular lower body clothing?: A Little 6 Click Score: 20   End of Session Equipment Utilized During Treatment: Rolling walker;Gait belt;Oxygen Nurse Communication: Mobility status  Activity Tolerance: Patient tolerated treatment well Patient left: in chair;with call bell/phone within reach  OT Visit Diagnosis: Unsteadiness on feet (R26.81);Muscle weakness (generalized) (M62.81);Other abnormalities of gait and mobility (R26.89)                Time: 6834-1962 OT Time Calculation (min): 35 min Charges:  OT General Charges $OT Visit: 1 Visit OT Evaluation $OT Eval Low Complexity: 1 Low OT Treatments $Self Care/Home Management : 8-22 mins  Zoriyah Scheidegger Beth Dixon, OTR/L 10/20/2020, 9:04 AM

## 2020-10-20 NOTE — Progress Notes (Signed)
   Daily Progress Note   Patient Name: Tiffany Owen       Date: 10/20/2020 DOB: March 05, 1961  Age: 60 y.o. MRN#: [id number] Attending Physician: Jonetta Osgood, MD Primary Care Physician: Lujean Amel, MD Admit Date: 10/18/2020  Reason for Consultation/Follow-up: Establishing goals of care  Subjective: Chart Reviewed. Updates Received. Patient Assessed.   Patient awake and alert. Sitting up. She and daughter states they are appreciative patient is going to be able to return home. Denies pain. Endorses shortness of breath with exertion but is at baseline. Tolerating baseline oxygen of 2-3L.   Reviewed goals of care discussion. Daughters and patient appreciative and expressed clear goals to continue to treat the treatable. They are relying on their strong Christian faith and support from the Oncology team to navigate patient's health. They would like ongoing Palliative support at discharge which is being arranged.   She continues to decline any needed changes in her pain regimen stating she is feeling much better.   All questions answered and support provided.   Length of Stay: 1 day  Vital Signs: BP 138/69 (BP Location: Right Wrist)   Pulse 87   Temp 98.1 F (36.7 C) (Oral)   Resp 20   Ht 4\' 10"  (1.473 m)   Wt 123 kg   SpO2 92%   BMI 56.67 kg/m  SpO2: SpO2: 92 % O2 Device: O2 Device: Nasal Cannula O2 Flow Rate: O2 Flow Rate (L/min): 3 L/min  Physical Exam: NAD, obese female RRR Tolerating 3L/[place] AAO, mood appropriate, follows commands             Palliative Care Assessment & Plan  HPI: Palliative Care consult requested for goals of care discussion in this 60 y.o. female  with past medical history of metastatic poorly differentiated lung cancer s/p chemotherapy in [place] ([place]), OSA, COPD on home oxygen 2-3L, dCHF, obesity, and hypertension. She was admitted on 10/18/2020 from home with with complaints of worsening shortness of breath and back pain. She was given 4mg  of IV  morphine and became somnolent requiring BiPAP. Has since been able to wean back to baseline oxygen of 3L nasal cannula. CT of chest showed no acute PE, mass in left lower lobe, multiple bilateral nodules, enlarged left hilar lymph nodes, and new cystic lesion on the left fifth, eighth, and right fifth rib. CT of head showed no acute abnormalities.    Code Status: Full code  Goals of Care/Recommendations: Continue to treat the treatable Patient to discharge home today with home equipment and ongoing palliative support ([facility]).  Prognosis: Guarded   Discharge Planning: Home with Home Health and outpatient Palliative support  Thank you for allowing the Palliative Medicine Team to assist in the care of this patient.  Time Total:  35 min.   Visit consisted of counseling and education dealing with the complex and emotionally intense issues of symptom management and palliative care in the setting of serious and potentially life-threatening illness.Greater than 50%  of this time was spent counseling and coordinating care related to the above assessment and plan.  Alda Lea, AGPCNP-BC  Palliative Medicine Team [phone]

## 2020-10-20 NOTE — Progress Notes (Signed)
Due to body habitus patient requires a wide 3 in 1. She will not be safe with a regular sized 3 in 1.

## 2020-10-20 NOTE — Progress Notes (Signed)
Patient continues to exhibit signs of hypercapnia associated with chronic respiratory failure secondary to severe COPD.  Patient requires the use of NIV both nightly and daytime to help with exacerbation.The use of the NIV will prevent patient's high PCO2 levels and can reduce the risk of exacerbations and future hospitalizations when used at night and during the day.  Patient will need these advanced settings in conjunction with current medication regimen, BiPAP is not an option due to its functional limitations and severity of the patient's condition.  Failure to have NIV available for use over 24 hour period could lead to death.  Patient is able to protect the airways and clear secretions on their own.

## 2020-10-20 NOTE — Progress Notes (Signed)
I followed up to see if scans are pushed to PACS and I did not see them. I called [facility] and they were able to help push scans through.  I also contacted [facility] to obtain testing results.  They are unable to provide at this time.

## 2020-10-20 NOTE — Plan of Care (Signed)
  Problem: Clinical Measurements: Goal: Ability to maintain clinical measurements within normal limits will improve Outcome: Progressing Goal: Respiratory complications will improve Outcome: Progressing Goal: Cardiovascular complication will be avoided Outcome: Progressing   Problem: Activity: Goal: Risk for activity intolerance will decrease Outcome: Progressing   Problem: Pain Managment: Goal: General experience of comfort will improve Outcome: Progressing   Problem: Safety: Goal: Ability to remain free from injury will improve Outcome: Progressing   Problem: Skin Integrity: Goal: Risk for impaired skin integrity will decrease Outcome: Progressing   

## 2020-10-20 NOTE — Progress Notes (Signed)
Tiffany Owen  D/C'd Home per MD order. D/C instructions discussed with the daughter and all questions answered.  An After Visit Summary was printed and given to the patient.  Patient escorted via [place], and D/C home via private auto. Patient belongings and valuables sent with patient. DME given to patient. [name]  10/20/2020 6:53 PM

## 2020-10-20 NOTE — Progress Notes (Signed)
Radiation Oncology         [phone]) [phone] ________________________________  Multidisciplinary Thoracic Oncology Clinic [facility]) Initial Outpatient Consultation  Name: Tiffany Owen MRN: [id number]  Date: 10/22/2020  DOB: 1960-06-05  VF:IEPPIRJ, [place], MD  Rigoberto Noel, MD   REFERRING PHYSICIAN: Rigoberto Noel, MD  DIAGNOSIS: Cancer of upper lobe of left lung     ICD-10-CM   1. Adenocarcinoma of left lung, stage 4 (HCC)  C34.92       HISTORY OF PRESENT ILLNESS::Tiffany Owen is a 60 y.o. female who presented for a Chest CT on 06/22/20 revealing: a left upper lobe pulmonary nodule and hilar adenopathy as well as sub cm bilateral pulmonary nodules.    PET scan received on 07/24/20 showed 1.6 cm nodule in the left lung apex (concerning for malignancy) as well as scattered subcentimeter pulmonary nodules in both lungs measuring up to 0.6 cm at the left anterior upper lobe. Also seen were multiple small to mildly enlarged mediastinal lymph nodes, and a bulky appearing  right hilar lymph node which measured up to 3.3 cm with moderate focal uptake. Imaging also revealed a bulky appearing adenopathy/mass in the left hilar/posterior hilar region measuring up to 4.3 cm.  The patient underwent bronchoscopy on 09/11/20. Pathology from the procedure revealed: malignant metastatic adenocarcinoma from both the fine needle aspiration of the 4R lymph node, and subcarinal lymph node aspiration.  The patient received CT of the chest 09/11/20 which revealed a left lower lobe mass like density noted to be suspicious for bronchogenic carcinoma. Also seen were numerous small bilateral lung nodules suspicious for pulmonary metastatic disease, and mildly enlarged mediastinal and hilar lymph nodes (noted to possibly be reactive or metastatic).   The patient began chemotherapy under Dr. Sherrilee Gilles on 10/13/20 in the [facility] area.  The patient will temporarily be moving to [facility] to live with her oldest  daughter while she is receiving chemotherapy.  Earlier today the patient was seen by Dr. Julien Nordmann will proceed with additional cycle of chemotherapy later this month.  The patient was recently brought to the [facility] ED by her daughter on 10/18/20 presenting with increased shortness of breath and back pain. When the patient arrived to the ED, she did not have her supplemental O2 on and her saturation was in the 50's. Se was accordingly placed on her usual 3L O2 and her O2 sat. returned to the 90's. During her ED visit she underwent CT angiogram of the chest which showed overall progression of her disease. The 3 x 3.6 x 3.9 cm mass versus masslike consolidation seen within the posteromedial left lower lobe was noted to be suspicious for malignancy. Also seen were multiple bilateral pulmonary nodules and enlarged left hilar lymph nodes noted to be worrisome for metastatic disease. In addition, a new finding of a 3 mm lytic lesion within the left 5th rib and left 8th rib was suggestive of metastatic disease. An indeterminate 6 mm sclerosis within the right 5th rib was seen as well, and an indeterminate 1.8 cm splenic lesion.  Of note: CT of the head was also taken during the patients hospital visit which showed atrophy with chronic small vessel white matter ischemic disease. Otherwise, no acute intracranial abnormalities were seen.   (Also of note: The patient has been on night time oxygen since March 2022, and uses it during the daytime on an as needed basis.)   The patient was referred today for presentation in the multidisciplinary conference.  Radiology studies and  pathology slides were presented there for review and discussion of treatment options.  A consensus was discussed regarding potential next steps.  PREVIOUS RADIATION THERAPY: No  PAST MEDICAL HISTORY:  has a past medical history of Cancer ([place]), CHF (congestive heart failure) ([place]), COPD (chronic obstructive pulmonary disease) ([place]),  Hypertension, and Pneumonia (04/17/2018).    PAST SURGICAL HISTORY: Past Surgical History:  Procedure Laterality Date   CESAREAN SECTION  1979; 1981; 1992   TUBAL LIGATION  1992    FAMILY HISTORY: family history includes Cancer in her brother, father, and mother; Diabetes Mellitus II in her father.  SOCIAL HISTORY:  reports that she does not have a smoking history on file. She has never used smokeless tobacco. She reports that she does not drink alcohol and does not use drugs.  She stopped smoking several years ago.  ALLERGIES: Patient has no allergy information on record.  MEDICATIONS:  Current Outpatient Medications  Medication Sig Dispense Refill   acetaminophen (TYLENOL) 500 MG tablet Take 2 tablets (1,000 mg total) by mouth every 8 (eight) hours. 30 tablet 0   budesonide (PULMICORT) 0.5 MG/2ML nebulizer solution Take by nebulization.     carvedilol (COREG) 12.5 MG tablet Take 12.5 mg by mouth 2 (two) times daily with a meal.     Cholecalciferol (VITAMIN D3 PO) Take 1 capsule by mouth daily.     Cyanocobalamin (VITAMIN B-12 PO) Take 1 tablet by mouth daily.     folic acid (FOLVITE) 1 MG tablet Take 1 mg by mouth See admin instructions. Take 1 mg daily by mouth starting 7 days before and continuing for 3 weeks after last chemo     guaiFENesin (ROBITUSSIN) 100 MG/5ML SOLN Take 15 mLs by mouth every 4 (four) hours as needed for cough or to loosen phlegm. (Patient not taking: Reported on 10/22/2020)     hydrochlorothiazide (HYDRODIURIL) 25 MG tablet Take 25 mg by mouth daily.     ibuprofen (ADVIL) 800 MG tablet Take 1 tablet (800 mg total) by mouth every 6 (six) hours as needed for mild pain. 30 tablet 0   ipratropium-albuterol (DUONEB) 0.5-2.5 (3) MG/3ML SOLN Take 3 mLs by nebulization every 6 (six) hours as needed. 360 mL 1   irbesartan (AVAPRO) 75 MG tablet Take 75 mg by mouth daily.     lidocaine (LIDODERM) 5 % Place 1 patch onto the skin daily. Remove & Discard patch within 12 hours  or as directed by MD (Patient not taking: Reported on 10/22/2020) 30 patch 0   metFORMIN (GLUCOPHAGE-XR) 500 MG 24 hr tablet Take 500 mg by mouth daily with breakfast.     Multiple Vitamins-Minerals (ONE-A-DAY WOMENS PO) Take 1 tablet by mouth daily with breakfast.     oxyCODONE (OXY IR/ROXICODONE) 5 MG immediate release tablet Take 1 tablet (5 mg total) by mouth every 8 (eight) hours as needed for moderate pain. (Patient not taking: Reported on 10/22/2020) 20 tablet 0   OXYGEN Inhale 3 L/min into the lungs continuous.     prochlorperazine (COMPAZINE) 10 MG tablet Take 10 mg by mouth every 6 (six) hours as needed for nausea or vomiting. (Patient not taking: Reported on 10/22/2020)     Tiotropium Bromide-Olodaterol (STIOLTO RESPIMAT) 2.5-2.5 MCG/ACT AERS Inhale 2 puffs into the lungs daily. 1 Inhaler 0   No current facility-administered medications for this encounter.    REVIEW OF SYSTEMS:  REVIEW OF SYSTEMS: A 10+ POINT REVIEW OF SYSTEMS WAS OBTAINED including neurology, dermatology, psychiatry, cardiac, respiratory, lymph, extremities, GI, GU,  musculoskeletal, constitutional, reproductive, HEENT. All pertinent positives are noted in the HPI. All others are negative.  She does complain of some lumbar back pain but has had this issue on and off for several years.  She denies any pain along the bilateral rib cage area where most recent chest CT scan shows early rib involvement.  she denies any headaches or visual problems.    PHYSICAL EXAM:  weight is 262 lb 14.4 oz (119.3 kg). Her temperature is 96.9 F (36.1 C) (abnormal). Her blood pressure is 110/62 and her pulse is 84. Her respiration is 18 and oxygen saturation is 100%.   General: Alert and oriented, in no acute distress, sitting comfortably in a wheelchair.  She is accompanied by her daughter on evaluation.  Supplemental oxygen in place. HEENT: Head is normocephalic. Extraocular movements are intact.  Neck: Neck is supple, no palpable cervical or  supraclavicular lymphadenopathy. Heart: Regular in rate and rhythm with no murmurs, rubs, or gallops. Chest: Clear to auscultation bilaterally, with no rhonchi, wheezes, or rales. Abdomen: Soft, nontender, nondistended, with no rigidity or guarding. Extremities: No cyanosis or edema. Lymphatics: see Neck Exam Skin: No concerning lesions. Musculoskeletal: symmetric strength and muscle tone throughout. Neurologic: Cranial nerves II through XII are grossly intact. No obvious focalities. Speech is fluent. Coordination is intact. Psychiatric: Judgment and insight are intact. Affect is appropriate.    KPS = 70  100 - Normal; no complaints; no evidence of disease. 90   - Able to carry on normal activity; minor signs or symptoms of disease. 80   - Normal activity with effort; some signs or symptoms of disease. 39   - Cares for self; unable to carry on normal activity or to do active work. 60   - Requires occasional assistance, but is able to care for most of his personal needs. 50   - Requires considerable assistance and frequent medical care. 50   - Disabled; requires special care and assistance. 38   - Severely disabled; hospital admission is indicated although death not imminent. 32   - Very sick; hospital admission necessary; active supportive treatment necessary. 10   - Moribund; fatal processes progressing rapidly. 0     - Dead  Karnofsky DA, Abelmann [address], Craver LS and Burchenal [facility] [phone]) The use of the nitrogen mustards in the palliative treatment of carcinoma: with particular reference to bronchogenic carcinoma Cancer 1 [id number]  LABORATORY DATA:  Lab Results  Component Value Date   WBC 4.0 10/22/2020   HGB 12.3 10/22/2020   HCT 39.1 10/22/2020   MCV 94.0 10/22/2020   PLT 142 (L) 10/22/2020   Lab Results  Component Value Date   NA 144 10/22/2020   K 3.9 10/22/2020   CL 94 (L) 10/22/2020   CO2 40 (H) 10/22/2020   Lab Results  Component Value Date   ALT 124 (H) 10/22/2020    AST 98 (H) 10/22/2020   ALKPHOS 109 10/22/2020   BILITOT 0.4 10/22/2020    PULMONARY FUNCTION TEST:   Recent Review Flowsheet Data   There is no flowsheet data to display.     RADIOGRAPHY: CT Head Wo Contrast  Result Date: 10/18/2020 CLINICAL DATA:  Mental status changes. EXAM: CT HEAD WITHOUT CONTRAST TECHNIQUE: Contiguous axial images were obtained from the base of the skull through the vertex without intravenous contrast. COMPARISON:  None. FINDINGS: Brain: There is no evidence for acute hemorrhage, hydrocephalus, mass lesion, or abnormal extra-axial fluid collection. No definite CT evidence for acute infarction. Intravascular  contrast evident secondary to recent CTA Chest, limiting assessment for subtle subarachnoid hemorrhage. Patchy low attenuation in the deep hemispheric and periventricular white matter is nonspecific, but likely reflects chronic microvascular ischemic demyelination. Vascular: Persistent enhancement of blood pool from recent CTA Chest . Skull: No evidence for fracture. No worrisome lytic or sclerotic lesion. Sinuses/Orbits: The visualized paranasal sinuses and mastoid air cells are clear. Visualized portions of the globes and intraorbital fat are unremarkable. Other: None. IMPRESSION: 1. No acute intracranial abnormality. 2. Atrophy with chronic small vessel white matter ischemic disease. Electronically Signed   By: Misty Stanley M.D.   On: 10/18/2020 13:27   CT Angio Chest PE W/Cm &/Or Wo Cm  Result Date: 10/18/2020 CLINICAL DATA:  60 year old female with shortness of breath and elevated D-dimer. EXAM: CT ANGIOGRAPHY CHEST WITH CONTRAST TECHNIQUE: Multidetector CT imaging of the chest was performed using the standard protocol during bolus administration of intravenous contrast. Multiplanar CT image reconstructions and MIPs were obtained to evaluate the vascular anatomy. CONTRAST:  159mL OMNIPAQUE IOHEXOL 350 MG/ML SOLN COMPARISON:  04/15/2018 CT.  10/18/2020 prior radiographs  FINDINGS: Cardiovascular: Satisfactory opacification of the pulmonary arteries to the segmental level. No evidence of pulmonary embolism. Cardiomegaly is noted. No thoracic aortic aneurysm or pericardial effusion identified. Mediastinum/Nodes: Enlarged LEFT hilar nodes are identified with a 1.7 cm index node (series 6: Image 47). Other UPPER limits of normal sized mediastinal lymph nodes are identified. The thyroid gland, trachea and esophagus are unremarkable. Lungs/Pleura: A 3 x 3.6 x 3.9 cm mass versus masslike consolidation is noted within the posteromedial aspect of the LEFT LOWER lobe (8:50). Multiple primarily subcentimeter bilateral pulmonary nodules are identified, many which are new from 04/15/2018. Index nodules include a 1.4 x 1.4 x 2 cm LEFT apical nodule (8:15), a 8 mm LEFT UPPER lobe nodule (8:23) and 6 mm LEFT LOWER lobe nodule (8:62). Scattered areas of atelectasis within both lungs are identified. No pleural effusion or pneumothorax identified. Upper Abdomen: A nonspecific 1.8 cm hypodense splenic lesion (6:80) is noted. Probable hepatic steatosis identified. Musculoskeletal: New 3 mm lytic lesions within the LEFT 5th rib and LEFT 8th rib are identified) 8:35 and 8:60. A 6 mm area of sclerosis within the anterior RIGHT 5th rib is noted. No other new bony abnormalities are identified. Review of the MIP images confirms the above findings. IMPRESSION: 1. 3 x 3.6 x 3.9 cm mass versus masslike consolidation within the posteromedial LEFT LOWER lobe - suspicious for malignancy. Multiple bilateral pulmonary nodules and enlarged LEFT hilar lymph nodes worrisome for metastatic disease. 2. New 3 mm lytic lesions within the LEFT 5th rib and LEFT 8th rib - suspicious for metastatic disease. Indeterminate 6 mm sclerosis within the RIGHT 5th rib. 3. Indeterminate 1.8 cm splenic lesion. Consider elective MRI for further evaluation. 4. No evidence of pulmonary emboli or thoracic aortic aneurysm. 5. Cardiomegaly. 6.  Probable hepatic steatosis. Electronically Signed   By: Margarette Canada M.D.   On: 10/18/2020 11:18   DG Chest Portable 1 View  Result Date: 10/18/2020 CLINICAL DATA:  Shortness of breath. EXAM: PORTABLE CHEST 1 VIEW COMPARISON:  None. FINDINGS: Mild cardiomegaly is noted. Mild left midlung subsegmental atelectasis is noted. No pneumothorax or significant pleural effusion is noted. Right lung is clear. Bony thorax is unremarkable. IMPRESSION: Mild left midlung subsegmental atelectasis. Electronically Signed   By: Marijo Conception M.D.   On: 10/18/2020 08:13   ECHOCARDIOGRAM COMPLETE  Result Date: 10/19/2020    ECHOCARDIOGRAM REPORT   Patient  Name:   CHARLY HUNTON Date of Exam: 10/19/2020 Medical Rec #:  [id number]  Height:       58.0 in Accession #:    [id number] Weight:       271.2 lb Date of Birth:  May 02, 1960  BSA:          2.073 m Patient Age:    86 years   BP:           138/78 mmHg Patient Gender: F          HR:           95 bpm. Exam Location:  Inpatient Procedure: 2D Echo, Cardiac Doppler and Color Doppler Indications:     Congestive heart failure  History:         Patient has prior history of Echocardiogram examinations, most                  recent 04/16/2018. COPD; Risk Factors:Hypertension,                  Dyslipidemia, Former Smoker and Sleep Apnea.  Sonographer:     Clayton Lefort RDCS (AE) Referring Phys:  [id number] [place] Diagnosing Phys: Charolette Forward MD IMPRESSIONS  1. Left ventricular ejection fraction, by estimation, is 55 to 60%. The left ventricle has normal function. The left ventricle has no regional wall motion abnormalities. Left ventricular diastolic parameters were normal.  2. Right ventricular systolic function is normal. The right ventricular size is normal. There is mildly elevated pulmonary artery systolic pressure.  3. There is no evidence of cardiac tamponade.  4. The mitral valve is normal in structure. Trivial mitral valve regurgitation.  5. The aortic valve is normal in  structure. Aortic valve regurgitation is not visualized. No aortic stenosis is present.  6. The inferior vena cava is normal in size with greater than 50% respiratory variability, suggesting right atrial pressure of 3 mmHg. FINDINGS  Left Ventricle: Left ventricular ejection fraction, by estimation, is 55 to 60%. The left ventricle has normal function. The left ventricle has no regional wall motion abnormalities. The left ventricular internal cavity size was normal in size. There is  no left ventricular hypertrophy. Left ventricular diastolic parameters were normal. Right Ventricle: The right ventricular size is normal. No increase in right ventricular wall thickness. Right ventricular systolic function is normal. There is mildly elevated pulmonary artery systolic pressure. The tricuspid regurgitant velocity is 2.99  m/s, and with an assumed right atrial pressure of 3 mmHg, the estimated right ventricular systolic pressure is 41.9 mmHg. Left Atrium: Left atrial size was normal in size. Right Atrium: Right atrial size was normal in size. Pericardium: Trivial pericardial effusion is present. There is no evidence of cardiac tamponade. Mitral Valve: The mitral valve is normal in structure. Trivial mitral valve regurgitation. MV peak gradient, 8.0 mmHg. The mean mitral valve gradient is 3.0 mmHg. Tricuspid Valve: The tricuspid valve is normal in structure. Tricuspid valve regurgitation is trivial. Aortic Valve: The aortic valve is normal in structure. Aortic valve regurgitation is not visualized. No aortic stenosis is present. Aortic valve mean gradient measures 7.0 mmHg. Aortic valve peak gradient measures 13.0 mmHg. Aortic valve area, by VTI measures 2.10 cm. Pulmonic Valve: The pulmonic valve was normal in structure. Pulmonic valve regurgitation is not visualized. Aorta: The aortic root is normal in size and structure. Venous: The inferior vena cava is normal in size with greater than 50% respiratory variability,  suggesting right atrial pressure of 3 mmHg.  IAS/Shunts: No atrial level shunt detected by color flow Doppler.  LEFT VENTRICLE PLAX 2D LVIDd:         3.70 cm  Diastology LVIDs:         2.30 cm  LV e' medial:    8.81 cm/s LV PW:         1.40 cm  LV E/e' medial:  13.2 LV IVS:        1.00 cm  LV e' lateral:   9.36 cm/s LVOT diam:     1.90 cm  LV E/e' lateral: 12.4 LV SV:         65 LV SV Index:   32 LVOT Area:     2.84 cm  RIGHT VENTRICLE             IVC RV Basal diam:  3.30 cm     IVC diam: 1.80 cm RV S prime:     14.60 cm/s TAPSE (M-mode): 2.9 cm LEFT ATRIUM             Index       RIGHT ATRIUM           Index LA diam:        4.00 cm 1.93 cm/m  RA Area:     16.40 cm LA Vol (A2C):   76.0 ml 36.67 ml/m RA Volume:   47.60 ml  22.97 ml/m LA Vol (A4C):   68.8 ml 33.19 ml/m LA Biplane Vol: 74.7 ml 36.04 ml/m  AORTIC VALVE AV Area (Vmax):    2.17 cm AV Area (Vmean):   2.08 cm AV Area (VTI):     2.10 cm AV Vmax:           180.00 cm/s AV Vmean:          127.000 cm/s AV VTI:            0.312 m AV Peak Grad:      13.0 mmHg AV Mean Grad:      7.0 mmHg LVOT Vmax:         138.00 cm/s LVOT Vmean:        93.200 cm/s LVOT VTI:          0.231 m LVOT/AV VTI ratio: 0.74  AORTA Ao Root diam: 3.20 cm Ao Asc diam:  3.60 cm MITRAL VALVE                TRICUSPID VALVE MV Area (PHT): 2.64 cm     TR Peak grad:   35.8 mmHg MV Area VTI:   1.96 cm     TR Vmax:        299.00 cm/s MV Peak grad:  8.0 mmHg MV Mean grad:  3.0 mmHg     SHUNTS MV Vmax:       1.41 m/s     Systemic VTI:  0.23 m MV Vmean:      86.6 cm/s    Systemic Diam: 1.90 cm MV Decel Time: 287 msec MV E velocity: 116.00 cm/s MV A velocity: 128.00 cm/s MV E/A ratio:  0.91 Charolette Forward MD Electronically signed by Charolette Forward MD Signature Date/Time: 10/19/2020/10:36:35 AM    Final       IMPRESSION: Stage IV adenocarcinoma of the lung.  As above patient will continue with standard chemotherapy for her advanced stage.  In reviewing the patient's chest CT scan there are no  immediate issues that would warrant palliative radiation therapy.  She denies any significant cough or hemoptysis.  The  patient denies any pain along the rib cage area where she may have early rib metastasis.  Her low back pain is been chronic and does not appear to be cancer related.  To complete the patient's staging work-up she will undergo MRI of the brain.  If she is found to have brain metastasis then we will consider radiation treatments for that issue.  PLAN: As needed follow-up in radiation oncology.  The patient will continue with chemotherapy.   Total time spent in this encounter was 45 minutes which included review of patient's outside imaging and pathology reports and imaging studies at [facility], reviewing the patient's medical records and physical examination.   ------------------------------------------------  Blair Promise, PhD, MD  This document serves as a record of services personally performed by Gery Pray, MD. It was created on his behalf by Roney Mans, a trained medical scribe. The creation of this record is based on the scribe's personal observations and the provider's statements to them. This document has been checked and approved by the attending provider.

## 2020-10-20 NOTE — TOC Transition Note (Addendum)
Transition of Care [facility]) - CM/SW Discharge Note   Patient Details  Name: Tiffany Owen MRN: [id number] Date of Birth: Nov 06, 1960  [facility] CM/SW Contact:  Pollie Friar, RN Phone Number: 10/20/2020, 2:36 PM   Clinical Narrative:    Patient is discharging home with [facility] services through [place]. Since she has a Pharmacist, community she will have a co pay. Alvis Lemmings will let the patient/ family know co pays before going to see patient and the family can decide if want to continue with [place]. Daughter aware and in agreement.  Palliative care arranged through [facility]. CM has sent in the referral and [facility] will call to set up the first home visit. Pt is on oxygen at home but daughter states she doesn't have transport tanks. Pt uses [facility] [phone] which is partnered with [employer]. [facility] is having her information sent to [facility] and they will f/u with patient on needs at home. CM spoke to [facility] and they will deliver a tank to the patient's hospital room today for transport home.  Bed/ walker/ wheelchair has been delivered to the home per [facility]. 3 in 1 for home to be delivered to the pts room per [employer]. Pt needs bipap for home. CM was able to obtain information on her sleep study from 5/22 from Dr Dillard Cannon in [place]. This has been sent to [facility] and they will deliver the bipap to the patients room. Orders are in [place].  Daughter will transport the patient home.  1600: Pt still doesn't qualify for a Bipap with her sleep study. Study recommending further f/u. [employer] is working to get her trilogy approved through insurance. MD is aware. Trilogy if approved will be delivered to the home either tonight or in am. MD in agreement.    Final next level of care: Home w Home Health Services Barriers to Discharge: No Barriers Identified   Patient Goals and CMS Choice   CMS Medicare.gov Compare Post Acute Care list provided to:: Patient Represenative (must  comment) Choice offered to / list presented to : Patient, Adult Children  Discharge Placement                       Discharge Plan and Services   Discharge Planning Services: CM Consult Post Acute Care Choice: Durable Medical Equipment, Home Health          DME Arranged: 3-N-1, Bipap DME Agency: [facility] Date DME Agency Contacted: 10/20/20   Representative spoke with at DME Agency: Freda Munro HH Arranged: PT, OT [facility] Agency: [place] Date [place]: 10/20/20   Representative spoke with at [place]: [place] ([place]) Interventions     Readmission Risk Interventions No flowsheet data found.

## 2020-10-20 NOTE — Discharge Summary (Addendum)
PATIENT DETAILS Name: Tiffany Owen Age: 60 y.o. Sex: female Date of Birth: [birth year] MRN: [id number]. Admitting Physician: Vashti Hey, MD [id number], Dibas, MD  Admit Date: 10/18/2020 Discharge date: 10/20/2020  Recommendations for Outpatient Follow-up:  Follow up with PCP in 1-2 weeks Please obtain CMP/CBC in one week Please ensure follow-up with oncology.  Admitted From:  Home   Disposition: Home with home health services   [place]: Yes  Equipment/Devices: Resume Home O2 3L/m  Discharge Condition: Stable  CODE STATUS: FULL CODE  Diet recommendation:  Diet Order             Diet - low sodium heart healthy           Diet Carb Modified           Diet Carb Modified Fluid consistency: Thin; Room service appropriate? Yes  Diet effective now                    Brief Summary: Patient is a 60 y.o. female metastatic lung cancer-s/p first cycle of chemotherapy last week, OSA noncompliant to CPAP, COPD on home O2 nocturnally-presented to the ED with worsening right upper back pain and shortness of breath.  She was initially hypoxic and required 15 L of oxygen but was gradually titrated back down to her usual oxygen regimen of 3 L.  She continued to have severe right upper back pain-and was given 4 mg of IV morphine in the hospital following which became somnolent-she was found to have acute on chronic hypercarbic respiratory failure and was subsequently started on BiPAP.  The hospitalist service was then asked to admit this patient for further evaluation and treatment.   Significant events: 7/3>> presented with shortness of breath-severe right upper back pain-given IV morphine-developed hypercarbia requiring BiPAP.   Significant studies: 7/3>> CTA chest: No PE, mass in the left lower lobe suspicious for malignancy, multiple bilateral nodules, enlarged left hilar lymph nodes, new cystic lesion in the left fifth rib, eighth rib and right fifth  rib. 7/3>> CT head: No acute intracranial abnormality. 7/4>> TTE: EF 55-60%, no regional wall motion abnormalities   Antimicrobial therapy: None   Microbiology data: 7/3>> COVID/influenza PCR: Negative   Procedures : None   Consults: Palliative care  Brief Hospital Course: Acute on chronic hypercarbic respiratory failure: Hypercarbia likely due to IV morphine-requiring BiPAP-liberated off BiPAP-and tolerating O2 3 L via nasal cannula.     Acute metabolic encephalopathy due to hypercarbia: Markedly improved-as noted above-hypercarbia was from IV morphine.    Minimally elevated troponins: Doubt any clinical significance-Echo with preserved EF-no further recommendations from cardiologist   Intractable back pain: Likely due to liver mets-pain mostly in the right upper back.  Initially intractable-patient did not tolerate IV morphine and developed hypercarbia-she is opiate nave.  Surprisingly-pain is currently controlled with just scheduled Tylenol and Lidoderm patch.  Patient will plan on using ibuprofen for breakthrough pain.  Minimize narcotics as much as possible-given that she is opioid nave-and has underlying OSA/OHS/morbid obesity/COPD-she is at risk of recurrent worsening hypercarbia with significant high doses of narcotics.  She was already on hydrocodone/Tylenol that was apparently started last week as an outpatient-which she was tolerating well.   Addendum: Informed by RN that after discharge orders were written-patient had severe pain-not responsive to tylenol and ibuprofen. She was then given prn Oxycodone-with success-she tolerated Oxycodone without any issues. Family is requesting a prescription for oxycodone, which I have ordered.   History of metastatic lung cancer:  Keep appointment with Dr. Willia Craze apparently has an appointment scheduled this coming Thursday.   HTN: BP stable-continue Coreg/irbesartan, HCTZ.  Follow and adjust.   DM-2 (A1c 6.5 on 7/3): CBG  stable-continue SSI-resume metformin on discharge.   Recent Labs (last 2 labs)       Recent Labs    10/19/20 0719 10/19/20 1127  GLUCAP 100* 101*      Chronic hypoxic respiratory failure: On 3 L of oxygen at home-back on usual regimen.   COPD: No exacerbation-continue bronchodilators.   OSA: Continue CPAP while in the hospital-I have spoken with CM regarding trying to arrange for CPAP on discharge.   Worsening debility/deconditioning: Per family-patient only able to take 5-6 steps over the past 1 week-primarily due to pain in her upper back.  Evaluated by rehab services-recommendations are for home health on discharge.   Morbid Obesity: Estimated body mass index is 56.67 kg/m as calculated from the following:   Height as of this encounter: 4\' 10"  (1.473 m).   Weight as of this encounter: 123 kg.     Procedures None  Discharge Diagnoses:  Active Problems:   Acute on chronic respiratory acidosis   Discharge Instructions:  Activity:  As tolerated with Full fall precautions use walker/cane & assistance as needed   Discharge Instructions     Call MD for:  difficulty breathing, headache or visual disturbances   Complete by: As directed    Call MD for:  extreme fatigue   Complete by: As directed    Call MD for:  persistant dizziness or light-headedness   Complete by: As directed    Diet - low sodium heart healthy   Complete by: As directed    Diet Carb Modified   Complete by: As directed    Discharge instructions   Complete by: As directed    Follow with Primary MD  Dorthy Cooler, Dibas, MD in 1-2 weeks  Please keep your existing appointment with Dr. Mohammed-oncology  Please follow-up with your pulmonologist-Dr. Elsworth Soho  Please get a complete blood count and chemistry panel checked by your Primary MD at your next visit, and again as instructed by your Primary MD.  Get Medicines reviewed and adjusted: Please take all your medications with you for your next visit with  your Primary MD  Laboratory/radiological data: Please request your Primary MD to go over all hospital tests and procedure/radiological results at the follow up, please ask your Primary MD to get all Hospital records sent to his/her office.  In some cases, they will be blood work, cultures and biopsy results pending at the time of your discharge. Please request that your primary care M.D. follows up on these results.  Also Note the following: If you experience worsening of your admission symptoms, develop shortness of breath, life threatening emergency, suicidal or homicidal thoughts you must seek medical attention immediately by calling 911 or calling your MD immediately  if symptoms less severe.  You must read complete instructions/literature along with all the possible adverse reactions/side effects for all the Medicines you take and that have been prescribed to you. Take any new Medicines after you have completely understood and accpet all the possible adverse reactions/side effects.   Do not drive when taking Pain medications or sleeping medications (Benzodaizepines)  Do not take more than prescribed Pain, Sleep and Anxiety Medications. It is not advisable to combine anxiety,sleep and pain medications without talking with your primary care practitioner  Special Instructions: If you have smoked or chewed Tobacco  in  the last 2 yrs please stop smoking, stop any regular Alcohol  and or any Recreational drug use.  Wear Seat belts while driving.  Please note: You were cared for by a hospitalist during your hospital stay. Once you are discharged, your primary care physician will handle any further medical issues. Please note that NO REFILLS for any discharge medications will be authorized once you are discharged, as it is imperative that you return to your primary care physician (or establish a relationship with a primary care physician if you do not have one) for your post hospital discharge  needs so that they can reassess your need for medications and monitor your lab values.   1.Continue taking Tylenol 1000 mg p.o. 3 times daily.  Maximum daily allowance for Tylenol is 4000 mg a day.  Please stay within this daily limit.  2.)  If you have breakthrough pain while on scheduled Tylenol-please take ibuprofen.  3.)  Continue to use Lidoderm patch.  4.)  If you still have pain with these measures-you may have to be started on a low-dose narcotic.  Please talk with your primary oncologist before taking narcotic medications.  5.)  Use oxygen 24/7.   Increase activity slowly   Complete by: As directed       Allergies as of 10/20/2020   Not on File      Medication List     STOP taking these medications    amoxicillin-clavulanate 875-125 MG tablet Commonly known as: AUGMENTIN   azithromycin 250 MG tablet Commonly known as: ZITHROMAX   benzonatate 200 MG capsule Commonly known as: TESSALON   HYDROcodone-acetaminophen 5-325 MG tablet Commonly known as: NORCO/VICODIN   lisinopril 10 MG tablet Commonly known as: ZESTRIL       TAKE these medications    acetaminophen 500 MG tablet Commonly known as: TYLENOL Take 2 tablets (1,000 mg total) by mouth every 8 (eight) hours.   carvedilol 12.5 MG tablet Commonly known as: COREG Take 12.5 mg by mouth 2 (two) times daily with a meal.   folic acid 1 MG tablet Commonly known as: FOLVITE Take 1 mg by mouth See admin instructions. Take 1 mg daily by mouth starting 7 days before and continuing for 3 weeks after last chemo   guaiFENesin 100 MG/5ML Soln Commonly known as: ROBITUSSIN Take 15 mLs by mouth every 4 (four) hours as needed for cough or to loosen phlegm.   hydrochlorothiazide 25 MG tablet Commonly known as: HYDRODIURIL Take 25 mg by mouth daily. What changed: Another medication with the same name was removed. Continue taking this medication, and follow the directions you see here.   ibuprofen 800 MG  tablet Commonly known as: ADVIL Take 1 tablet (800 mg total) by mouth every 6 (six) hours as needed for mild pain.   ipratropium-albuterol 0.5-2.5 (3) MG/3ML Soln Commonly known as: DUONEB Take 3 mLs by nebulization every 6 (six) hours as needed (for shortness of breath or wheezing).   ipratropium-albuterol 0.5-2.5 (3) MG/3ML Soln Commonly known as: DUONEB Take 3 mLs by nebulization every 6 (six) hours as needed.   irbesartan 75 MG tablet Commonly known as: AVAPRO Take 75 mg by mouth daily.   lidocaine 5 % Commonly known as: LIDODERM Place 1 patch onto the skin daily. Remove & Discard patch within 12 hours or as directed by MD   metFORMIN 500 MG 24 hr tablet Commonly known as: GLUCOPHAGE-XR Take 500 mg by mouth daily with breakfast.   omeprazole 40 MG capsule Commonly  known as: PRILOSEC   ONE-A-DAY WOMENS PO Take 1 tablet by mouth daily with breakfast.   oxyCODONE 5 MG immediate release tablet Commonly known as: Oxy IR/ROXICODONE Take 1 tablet (5 mg total) by mouth every 8 (eight) hours as needed for moderate pain.   OXYGEN Inhale 3 L/min into the lungs continuous.   prochlorperazine 10 MG tablet Commonly known as: COMPAZINE Take 10 mg by mouth every 6 (six) hours as needed for nausea or vomiting.   Tiotropium Bromide-Olodaterol 2.5-2.5 MCG/ACT Aers Commonly known as: Stiolto Respimat Inhale 2 puffs into the lungs daily.   VITAMIN B-12 PO Take 1 tablet by mouth daily.   VITAMIN D3 PO Take 1 capsule by mouth daily.               Durable Medical Equipment  (From admission, onward)           Start     Ordered   10/20/20 1406  For home use only DME Bipap  Once       Question Answer Comment  Length of Need Lifetime   Bleed in oxygen (LPM) 3   Inspiratory pressure 18   Expiratory pressure 8      10/20/20 1406   10/20/20 1217  For home use only DME 3 n 1  Once       Comments: Wide size d/t body habitus   10/20/20 1218   10/20/20 1046  For home  use only DME oxygen  Once       Question Answer Comment  Length of Need 6 Months   Mode or (Route) Nasal cannula   Liters per Minute 3   Frequency Continuous (stationary and portable oxygen unit needed)   Oxygen conserving device Yes   Oxygen delivery system Gas      10/20/20 1046   10/19/20 1321  For home use only DME Walker wide  Once       Question:  Patient needs a walker to treat with the following condition  Answer:  Weakness   10/19/20 1321   10/19/20 1320  For home use only DME standard manual wheelchair with seat cushion  Once       Comments: Patient suffers from weakness which impairs their ability to perform daily activities like bathing, dressing, and grooming in the home.  A walker will not resolve issue with performing activities of daily living. A wheelchair will allow patient to safely perform daily activities. Patient can safely propel the wheelchair in the home or has a caregiver who can provide assistance. Length of need 6 months . Accessories: elevating leg rests (ELRs), wheel locks, extensions and anti-tippers.   10/19/20 1320   10/19/20 1318  For home use only DME Hospital bed  Once       Question Answer Comment  Length of Need 6 Months   The above medical condition requires: Patient requires the ability to reposition frequently   Head must be elevated greater than: 30 degrees   Bed type Semi-electric   Support Surface: Gel Overlay      10/19/20 1319            Follow-up Information     Koirala, Dibas, MD Follow up in 1 week(s).   Specialty: Family Medicine Contact information: [place] [address] [phone]         Curt Bears, MD Follow up.   Specialty: Oncology Why: keep existing appt Contact information: [place] [address]  [phone]         Rigoberto Noel, MD. Schedule an appointment as soon as possible for a visit in 1 week(s).   Specialty: Pulmonary  Disease Contact information: [address] [place] [address] [phone]         [facility] Follow up.   Why: The palliative care will contact you for the first home visit. Contact information: [place] [phone]        [facility] Follow up.   Specialty: Home Health Services Why: The home health agency will contact you for the first home visit. Contact information: [address] [place] [address] [phone]         [facility] Follow up.   Why: [facility]--DME Contact informationKarene Fry [place] [address] [phone]                Not on File    Consultations:  Palliative   Other Procedures/Studies: CT Head Wo Contrast  Result Date: 10/18/2020 CLINICAL DATA:  Mental status changes. EXAM: CT HEAD WITHOUT CONTRAST TECHNIQUE: Contiguous axial images were obtained from the base of the skull through the vertex without intravenous contrast. COMPARISON:  None. FINDINGS: Brain: There is no evidence for acute hemorrhage, hydrocephalus, mass lesion, or abnormal extra-axial fluid collection. No definite CT evidence for acute infarction. Intravascular contrast evident secondary to recent CTA Chest, limiting assessment for subtle subarachnoid hemorrhage. Patchy low attenuation in the deep hemispheric and periventricular white matter is nonspecific, but likely reflects chronic microvascular ischemic demyelination. Vascular: Persistent enhancement of blood pool from recent CTA Chest . Skull: No evidence for fracture. No worrisome lytic or sclerotic lesion. Sinuses/Orbits: The visualized paranasal sinuses and mastoid air cells are clear. Visualized portions of the globes and intraorbital fat are unremarkable. Other: None. IMPRESSION: 1. No acute intracranial abnormality. 2. Atrophy with chronic small vessel white matter ischemic disease.  Electronically Signed   By: Misty Stanley M.D.   On: 10/18/2020 13:27   CT Angio Chest PE W/Cm &/Or Wo Cm  Result Date: 10/18/2020 CLINICAL DATA:  60 year old female with shortness of breath and elevated D-dimer. EXAM: CT ANGIOGRAPHY CHEST WITH CONTRAST TECHNIQUE: Multidetector CT imaging of the chest was performed using the standard protocol during bolus administration of intravenous contrast. Multiplanar CT image reconstructions and MIPs were obtained to evaluate the vascular anatomy. CONTRAST:  158mL OMNIPAQUE IOHEXOL 350 MG/ML SOLN COMPARISON:  04/15/2018 CT.  10/18/2020 prior radiographs FINDINGS: Cardiovascular: Satisfactory opacification of the pulmonary arteries to the segmental level. No evidence of pulmonary embolism. Cardiomegaly is noted. No thoracic aortic aneurysm or pericardial effusion identified. Mediastinum/Nodes: Enlarged LEFT hilar nodes are identified with a 1.7 cm index node (series 6: Image 47). Other UPPER limits of normal sized mediastinal lymph nodes are identified. The thyroid gland, trachea and esophagus are unremarkable. Lungs/Pleura: A 3 x 3.6 x 3.9 cm mass versus masslike consolidation is noted within the posteromedial aspect of the LEFT LOWER lobe (8:50). Multiple primarily subcentimeter bilateral pulmonary nodules are identified, many which are new from 04/15/2018. Index nodules include a 1.4 x 1.4 x 2 cm LEFT apical nodule (8:15), a 8 mm LEFT UPPER lobe nodule (8:23) and 6 mm LEFT LOWER lobe nodule (8:62). Scattered areas of atelectasis within both lungs are identified. No pleural effusion or pneumothorax identified. Upper Abdomen: A nonspecific 1.8 cm hypodense splenic lesion (6:80) is noted. Probable hepatic steatosis identified. Musculoskeletal: New 3 mm lytic  lesions within the LEFT 5th rib and LEFT 8th rib are identified) 8:35 and 8:60. A 6 mm area of sclerosis within the anterior RIGHT 5th rib is noted. No other new bony abnormalities are identified. Review of the MIP  images confirms the above findings. IMPRESSION: 1. 3 x 3.6 x 3.9 cm mass versus masslike consolidation within the posteromedial LEFT LOWER lobe - suspicious for malignancy. Multiple bilateral pulmonary nodules and enlarged LEFT hilar lymph nodes worrisome for metastatic disease. 2. New 3 mm lytic lesions within the LEFT 5th rib and LEFT 8th rib - suspicious for metastatic disease. Indeterminate 6 mm sclerosis within the RIGHT 5th rib. 3. Indeterminate 1.8 cm splenic lesion. Consider elective MRI for further evaluation. 4. No evidence of pulmonary emboli or thoracic aortic aneurysm. 5. Cardiomegaly. 6. Probable hepatic steatosis. Electronically Signed   By: Margarette Canada M.D.   On: 10/18/2020 11:18   DG Chest Portable 1 View  Result Date: 10/18/2020 CLINICAL DATA:  Shortness of breath. EXAM: PORTABLE CHEST 1 VIEW COMPARISON:  None. FINDINGS: Mild cardiomegaly is noted. Mild left midlung subsegmental atelectasis is noted. No pneumothorax or significant pleural effusion is noted. Right lung is clear. Bony thorax is unremarkable. IMPRESSION: Mild left midlung subsegmental atelectasis. Electronically Signed   By: Marijo Conception M.D.   On: 10/18/2020 08:13   ECHOCARDIOGRAM COMPLETE  Result Date: 10/19/2020    ECHOCARDIOGRAM REPORT   Patient Name:   Tiffany Owen Date of Exam: 10/19/2020 Medical Rec #:  [id number]  Height:       58.0 in Accession #:    [id number] Weight:       271.2 lb Date of Birth:  12/10/1960  BSA:          2.073 m Patient Age:    51 years   BP:           138/78 mmHg Patient Gender: F          HR:           95 bpm. Exam Location:  Inpatient Procedure: 2D Echo, Cardiac Doppler and Color Doppler Indications:     Congestive heart failure  History:         Patient has prior history of Echocardiogram examinations, most                  recent 04/16/2018. COPD; Risk Factors:Hypertension,                  Dyslipidemia, Former Smoker and Sleep Apnea.  Sonographer:     Clayton Lefort RDCS (AE) Referring Phys:   [id number] [place] Diagnosing Phys: Charolette Forward MD IMPRESSIONS  1. Left ventricular ejection fraction, by estimation, is 55 to 60%. The left ventricle has normal function. The left ventricle has no regional wall motion abnormalities. Left ventricular diastolic parameters were normal.  2. Right ventricular systolic function is normal. The right ventricular size is normal. There is mildly elevated pulmonary artery systolic pressure.  3. There is no evidence of cardiac tamponade.  4. The mitral valve is normal in structure. Trivial mitral valve regurgitation.  5. The aortic valve is normal in structure. Aortic valve regurgitation is not visualized. No aortic stenosis is present.  6. The inferior vena cava is normal in size with greater than 50% respiratory variability, suggesting right atrial pressure of 3 mmHg. FINDINGS  Left Ventricle: Left ventricular ejection fraction, by estimation, is 55 to 60%. The left ventricle has normal function. The left ventricle has no regional  wall motion abnormalities. The left ventricular internal cavity size was normal in size. There is  no left ventricular hypertrophy. Left ventricular diastolic parameters were normal. Right Ventricle: The right ventricular size is normal. No increase in right ventricular wall thickness. Right ventricular systolic function is normal. There is mildly elevated pulmonary artery systolic pressure. The tricuspid regurgitant velocity is 2.99  m/s, and with an assumed right atrial pressure of 3 mmHg, the estimated right ventricular systolic pressure is 48.5 mmHg. Left Atrium: Left atrial size was normal in size. Right Atrium: Right atrial size was normal in size. Pericardium: Trivial pericardial effusion is present. There is no evidence of cardiac tamponade. Mitral Valve: The mitral valve is normal in structure. Trivial mitral valve regurgitation. MV peak gradient, 8.0 mmHg. The mean mitral valve gradient is 3.0 mmHg. Tricuspid Valve: The  tricuspid valve is normal in structure. Tricuspid valve regurgitation is trivial. Aortic Valve: The aortic valve is normal in structure. Aortic valve regurgitation is not visualized. No aortic stenosis is present. Aortic valve mean gradient measures 7.0 mmHg. Aortic valve peak gradient measures 13.0 mmHg. Aortic valve area, by VTI measures 2.10 cm. Pulmonic Valve: The pulmonic valve was normal in structure. Pulmonic valve regurgitation is not visualized. Aorta: The aortic root is normal in size and structure. Venous: The inferior vena cava is normal in size with greater than 50% respiratory variability, suggesting right atrial pressure of 3 mmHg. IAS/Shunts: No atrial level shunt detected by color flow Doppler.  LEFT VENTRICLE PLAX 2D LVIDd:         3.70 cm  Diastology LVIDs:         2.30 cm  LV e' medial:    8.81 cm/s LV PW:         1.40 cm  LV E/e' medial:  13.2 LV IVS:        1.00 cm  LV e' lateral:   9.36 cm/s LVOT diam:     1.90 cm  LV E/e' lateral: 12.4 LV SV:         65 LV SV Index:   32 LVOT Area:     2.84 cm  RIGHT VENTRICLE             IVC RV Basal diam:  3.30 cm     IVC diam: 1.80 cm RV S prime:     14.60 cm/s TAPSE (M-mode): 2.9 cm LEFT ATRIUM             Index       RIGHT ATRIUM           Index LA diam:        4.00 cm 1.93 cm/m  RA Area:     16.40 cm LA Vol (A2C):   76.0 ml 36.67 ml/m RA Volume:   47.60 ml  22.97 ml/m LA Vol (A4C):   68.8 ml 33.19 ml/m LA Biplane Vol: 74.7 ml 36.04 ml/m  AORTIC VALVE AV Area (Vmax):    2.17 cm AV Area (Vmean):   2.08 cm AV Area (VTI):     2.10 cm AV Vmax:           180.00 cm/s AV Vmean:          127.000 cm/s AV VTI:            0.312 m AV Peak Grad:      13.0 mmHg AV Mean Grad:      7.0 mmHg LVOT Vmax:         138.00 cm/s  LVOT Vmean:        93.200 cm/s LVOT VTI:          0.231 m LVOT/AV VTI ratio: 0.74  AORTA Ao Root diam: 3.20 cm Ao Asc diam:  3.60 cm MITRAL VALVE                TRICUSPID VALVE MV Area (PHT): 2.64 cm     TR Peak grad:   35.8 mmHg MV Area  VTI:   1.96 cm     TR Vmax:        299.00 cm/s MV Peak grad:  8.0 mmHg MV Mean grad:  3.0 mmHg     SHUNTS MV Vmax:       1.41 m/s     Systemic VTI:  0.23 m MV Vmean:      86.6 cm/s    Systemic Diam: 1.90 cm MV Decel Time: 287 msec MV E velocity: 116.00 cm/s MV A velocity: 128.00 cm/s MV E/A ratio:  0.91 Charolette Forward MD Electronically signed by Charolette Forward MD Signature Date/Time: 10/19/2020/10:36:35 AM    Final      TODAY-DAY OF DISCHARGE:  Subjective:   Tiffany Owen today has no headache,no chest abdominal pain,no new weakness tingling or numbness, feels much better wants to go home today.  Objective:   Blood pressure 138/69, pulse 87, temperature 98.1 F (36.7 C), temperature source Oral, resp. rate 20, height 4\' 10"  (1.473 m), weight 123 kg, SpO2 92 %.  Intake/Output Summary (Last 24 hours) at 10/20/2020 1815 Last data filed at 10/20/2020 1100 Gross per 24 hour  Intake 123 ml  Output 250 ml  Net -127 ml   Filed Weights   10/18/20 2155 10/18/20 2205  Weight: 123 kg 123 kg    Exam: Awake Alert, Oriented *3, No new F.N deficits, Normal affect [place].AT,PERRAL Supple Neck,No JVD, No cervical lymphadenopathy appriciated.  Symmetrical Chest wall movement, Good air movement bilaterally, CTAB RRR,No Gallops,Rubs or new Murmurs, No Parasternal Heave +ve B.Sounds, Abd Soft, Non tender, No organomegaly appriciated, No rebound -guarding or rigidity. No Cyanosis, Clubbing or edema, No new Rash or bruise   PERTINENT RADIOLOGIC STUDIES: ECHOCARDIOGRAM COMPLETE  Result Date: 10/19/2020    ECHOCARDIOGRAM REPORT   Patient Name:   Tiffany Owen Date of Exam: 10/19/2020 Medical Rec #:  [id number]  Height:       58.0 in Accession #:    [id number] Weight:       271.2 lb Date of Birth:  November 14, 1960  BSA:          2.073 m Patient Age:    82 years   BP:           138/78 mmHg Patient Gender: F          HR:           95 bpm. Exam Location:  Inpatient Procedure: 2D Echo, Cardiac Doppler and Color Doppler Indications:      Congestive heart failure  History:         Patient has prior history of Echocardiogram examinations, most                  recent 04/16/2018. COPD; Risk Factors:Hypertension,                  Dyslipidemia, Former Smoker and Sleep Apnea.  Sonographer:     Clayton Lefort RDCS (AE) Referring Phys:  [id number] [place] Diagnosing Phys: Charolette Forward MD IMPRESSIONS  1. Left ventricular ejection fraction,  by estimation, is 55 to 60%. The left ventricle has normal function. The left ventricle has no regional wall motion abnormalities. Left ventricular diastolic parameters were normal.  2. Right ventricular systolic function is normal. The right ventricular size is normal. There is mildly elevated pulmonary artery systolic pressure.  3. There is no evidence of cardiac tamponade.  4. The mitral valve is normal in structure. Trivial mitral valve regurgitation.  5. The aortic valve is normal in structure. Aortic valve regurgitation is not visualized. No aortic stenosis is present.  6. The inferior vena cava is normal in size with greater than 50% respiratory variability, suggesting right atrial pressure of 3 mmHg. FINDINGS  Left Ventricle: Left ventricular ejection fraction, by estimation, is 55 to 60%. The left ventricle has normal function. The left ventricle has no regional wall motion abnormalities. The left ventricular internal cavity size was normal in size. There is  no left ventricular hypertrophy. Left ventricular diastolic parameters were normal. Right Ventricle: The right ventricular size is normal. No increase in right ventricular wall thickness. Right ventricular systolic function is normal. There is mildly elevated pulmonary artery systolic pressure. The tricuspid regurgitant velocity is 2.99  m/s, and with an assumed right atrial pressure of 3 mmHg, the estimated right ventricular systolic pressure is 18.8 mmHg. Left Atrium: Left atrial size was normal in size. Right Atrium: Right atrial size was  normal in size. Pericardium: Trivial pericardial effusion is present. There is no evidence of cardiac tamponade. Mitral Valve: The mitral valve is normal in structure. Trivial mitral valve regurgitation. MV peak gradient, 8.0 mmHg. The mean mitral valve gradient is 3.0 mmHg. Tricuspid Valve: The tricuspid valve is normal in structure. Tricuspid valve regurgitation is trivial. Aortic Valve: The aortic valve is normal in structure. Aortic valve regurgitation is not visualized. No aortic stenosis is present. Aortic valve mean gradient measures 7.0 mmHg. Aortic valve peak gradient measures 13.0 mmHg. Aortic valve area, by VTI measures 2.10 cm. Pulmonic Valve: The pulmonic valve was normal in structure. Pulmonic valve regurgitation is not visualized. Aorta: The aortic root is normal in size and structure. Venous: The inferior vena cava is normal in size with greater than 50% respiratory variability, suggesting right atrial pressure of 3 mmHg. IAS/Shunts: No atrial level shunt detected by color flow Doppler.  LEFT VENTRICLE PLAX 2D LVIDd:         3.70 cm  Diastology LVIDs:         2.30 cm  LV e' medial:    8.81 cm/s LV PW:         1.40 cm  LV E/e' medial:  13.2 LV IVS:        1.00 cm  LV e' lateral:   9.36 cm/s LVOT diam:     1.90 cm  LV E/e' lateral: 12.4 LV SV:         65 LV SV Index:   32 LVOT Area:     2.84 cm  RIGHT VENTRICLE             IVC RV Basal diam:  3.30 cm     IVC diam: 1.80 cm RV S prime:     14.60 cm/s TAPSE (M-mode): 2.9 cm LEFT ATRIUM             Index       RIGHT ATRIUM           Index LA diam:        4.00 cm 1.93 cm/m  RA Area:  16.40 cm LA Vol (A2C):   76.0 ml 36.67 ml/m RA Volume:   47.60 ml  22.97 ml/m LA Vol (A4C):   68.8 ml 33.19 ml/m LA Biplane Vol: 74.7 ml 36.04 ml/m  AORTIC VALVE AV Area (Vmax):    2.17 cm AV Area (Vmean):   2.08 cm AV Area (VTI):     2.10 cm AV Vmax:           180.00 cm/s AV Vmean:          127.000 cm/s AV VTI:            0.312 m AV Peak Grad:      13.0 mmHg AV  Mean Grad:      7.0 mmHg LVOT Vmax:         138.00 cm/s LVOT Vmean:        93.200 cm/s LVOT VTI:          0.231 m LVOT/AV VTI ratio: 0.74  AORTA Ao Root diam: 3.20 cm Ao Asc diam:  3.60 cm MITRAL VALVE                TRICUSPID VALVE MV Area (PHT): 2.64 cm     TR Peak grad:   35.8 mmHg MV Area VTI:   1.96 cm     TR Vmax:        299.00 cm/s MV Peak grad:  8.0 mmHg MV Mean grad:  3.0 mmHg     SHUNTS MV Vmax:       1.41 m/s     Systemic VTI:  0.23 m MV Vmean:      86.6 cm/s    Systemic Diam: 1.90 cm MV Decel Time: 287 msec MV E velocity: 116.00 cm/s MV A velocity: 128.00 cm/s MV E/A ratio:  0.91 Charolette Forward MD Electronically signed by Charolette Forward MD Signature Date/Time: 10/19/2020/10:36:35 AM    Final      PERTINENT LAB RESULTS: CBC: Recent Labs    10/18/20 0728 10/18/20 1438 10/19/20 0122  WBC 19.6*  --  13.1*  HGB 14.7 15.0 12.9  HCT 46.8* 44.0 41.9  PLT 206  --  173   CMET CMP     Component Value Date/Time   NA 136 10/19/2020 0122   K 4.2 10/19/2020 0122   CL 95 (L) 10/19/2020 0122   CO2 35 (H) 10/19/2020 0122   GLUCOSE 114 (H) 10/19/2020 0122   BUN 16 10/19/2020 0122   CREATININE 0.73 10/19/2020 0122   CALCIUM 8.8 (L) 10/19/2020 0122   GFRNONAA >60 10/19/2020 0122   GFRAA >60 04/17/2018 0732    GFR Estimated Creatinine Clearance: 85.9 mL/min (by C-G formula based on SCr of 0.73 mg/dL). No results for input(s): LIPASE, AMYLASE in the last 72 hours. No results for input(s): CKTOTAL, CKMB, CKMBINDEX, TROPONINI in the last 72 hours. Invalid input(s): POCBNP Recent Labs    10/18/20 0729  DDIMER 7.42*   Recent Labs    10/18/20 2237  HGBA1C 6.5*   Recent Labs    10/18/20 2237  CHOL 93  HDL 27*  LDLCALC 50  TRIG 80  CHOLHDL 3.4   No results for input(s): TSH, T4TOTAL, T3FREE, THYROIDAB in the last 72 hours.  Invalid input(s): FREET3 No results for input(s): VITAMINB12, FOLATE, FERRITIN, TIBC, IRON, RETICCTPCT in the last 72 hours. Coags: Recent Labs     10/18/20 1126  INR 1.2   Microbiology: Recent Results (from the past 240 hour(s))  Resp Panel by RT-PCR (Flu A&B, Covid) Nasopharyngeal  Swab     Status: None   Collection Time: 10/18/20 11:20 AM   Specimen: Nasopharyngeal Swab; Nasopharyngeal(NP) swabs in vial transport medium  Result Value Ref Range Status   SARS Coronavirus 2 by RT PCR NEGATIVE NEGATIVE Final    Comment: (NOTE) SARS-CoV-2 target nucleic acids are NOT DETECTED.  The SARS-CoV-2 RNA is generally detectable in upper respiratory specimens during the acute phase of infection. The lowest concentration of SARS-CoV-2 viral copies this assay can detect is 138 copies/mL. A negative result does not preclude SARS-Cov-2 infection and should not be used as the sole basis for treatment or other patient management decisions. A negative result may occur with  improper specimen collection/handling, submission of specimen other than nasopharyngeal swab, presence of viral mutation(s) within the areas targeted by this assay, and inadequate number of viral copies(<138 copies/mL). A negative result must be combined with clinical observations, patient history, and epidemiological information. The expected result is Negative.  Fact Sheet for Patients:  EntrepreneurPulse.com.au  Fact Sheet for Healthcare Providers:  IncredibleEmployment.be  This test is no t yet approved or cleared by the [place] FDA and  has been authorized for detection and/or diagnosis of SARS-CoV-2 by FDA under an Emergency Use Authorization (EUA). This EUA will remain  in effect (meaning this test can be used) for the duration of the COVID-19 declaration under Section 564(b)(1) of the Act, 21 U.S.C.section 360bbb-3(b)(1), unless the authorization is terminated  or revoked sooner.       Influenza A by PCR NEGATIVE NEGATIVE Final   Influenza B by PCR NEGATIVE NEGATIVE Final    Comment: (NOTE) The Xpert Xpress  SARS-CoV-2/FLU/RSV plus assay is intended as an aid in the diagnosis of influenza from Nasopharyngeal swab specimens and should not be used as a sole basis for treatment. Nasal washings and aspirates are unacceptable for Xpert Xpress SARS-CoV-2/FLU/RSV testing.  Fact Sheet for Patients: EntrepreneurPulse.com.au  Fact Sheet for Healthcare Providers: IncredibleEmployment.be  This test is not yet approved or cleared by the [place] FDA and has been authorized for detection and/or diagnosis of SARS-CoV-2 by FDA under an Emergency Use Authorization (EUA). This EUA will remain in effect (meaning this test can be used) for the duration of the COVID-19 declaration under Section 564(b)(1) of the Act, 21 U.S.C. section 360bbb-3(b)(1), unless the authorization is terminated or revoked.  Performed at [facility], [place] [address], [place], [place] [address]     FURTHER DISCHARGE INSTRUCTIONS:  Get Medicines reviewed and adjusted: Please take all your medications with you for your next visit with your Primary MD  Laboratory/radiological data: Please request your Primary MD to go over all hospital tests and procedure/radiological results at the follow up, please ask your Primary MD to get all Hospital records sent to his/her office.  In some cases, they will be blood work, cultures and biopsy results pending at the time of your discharge. Please request that your primary care M.D. goes through all the records of your hospital data and follows up on these results.  Also Note the following: If you experience worsening of your admission symptoms, develop shortness of breath, life threatening emergency, suicidal or homicidal thoughts you must seek medical attention immediately by calling 911 or calling your MD immediately  if symptoms less severe.  You must read complete instructions/literature along with all the possible adverse reactions/side effects  for all the Medicines you take and that have been prescribed to you. Take any new Medicines after you have completely understood  and accpet all the possible adverse reactions/side effects.   Do not drive when taking Pain medications or sleeping medications (Benzodaizepines)  Do not take more than prescribed Pain, Sleep and Anxiety Medications. It is not advisable to combine anxiety,sleep and pain medications without talking with your primary care practitioner  Special Instructions: If you have smoked or chewed Tobacco  in the last 2 yrs please stop smoking, stop any regular Alcohol  and or any Recreational drug use.  Wear Seat belts while driving.  Please note: You were cared for by a hospitalist during your hospital stay. Once you are discharged, your primary care physician will handle any further medical issues. Please note that NO REFILLS for any discharge medications will be authorized once you are discharged, as it is imperative that you return to your primary care physician (or establish a relationship with a primary care physician if you do not have one) for your post hospital discharge needs so that they can reassess your need for medications and monitor your lab values.  Total Time spent coordinating discharge including counseling, education and face to face time equals 35 minutes.  SignedOren Binet 10/20/2020 6:15 PM

## 2020-10-20 NOTE — Progress Notes (Signed)
Hydrologist [facility])  Hospital Liaison RN note         Notified by [facility] manager of patient/family request for [facility] services at home after discharge.         [facility] team will follow up with patient after discharge.         Please call with any hospice or palliative related questions.         Thank you for the opportunity to participate in this patient's care.     Matilde Sprang [facility] Liaison (listed on AMION under Hospice  [phone]

## 2020-10-21 ENCOUNTER — Encounter: Payer: Self-pay | Admitting: *Deleted

## 2020-10-21 DIAGNOSIS — J449 Chronic obstructive pulmonary disease, unspecified: Secondary | ICD-10-CM | POA: Diagnosis not present

## 2020-10-21 DIAGNOSIS — J9610 Chronic respiratory failure, unspecified whether with hypoxia or hypercapnia: Secondary | ICD-10-CM | POA: Diagnosis not present

## 2020-10-21 DIAGNOSIS — C3412 Malignant neoplasm of upper lobe, left bronchus or lung: Secondary | ICD-10-CM | POA: Diagnosis not present

## 2020-10-21 NOTE — Progress Notes (Signed)
I contacted [facility] to get an update on molecular report. Wait for response.

## 2020-10-22 ENCOUNTER — Inpatient Hospital Stay: Payer: BC Managed Care – PPO | Attending: Internal Medicine | Admitting: Internal Medicine

## 2020-10-22 ENCOUNTER — Ambulatory Visit
Admit: 2020-10-22 | Discharge: 2020-10-22 | Disposition: A | Discharge status: Home / Self Care | Payer: BC Managed Care – PPO | Attending: Radiation Oncology | Admitting: Radiation Oncology

## 2020-10-22 ENCOUNTER — Encounter: Payer: Self-pay | Admitting: *Deleted

## 2020-10-22 ENCOUNTER — Telehealth: Payer: Self-pay

## 2020-10-22 ENCOUNTER — Other Ambulatory Visit: Payer: Self-pay

## 2020-10-22 ENCOUNTER — Other Ambulatory Visit: Payer: Self-pay | Admitting: *Deleted

## 2020-10-22 ENCOUNTER — Inpatient Hospital Stay: Payer: BC Managed Care – PPO

## 2020-10-22 VITALS — BP 110/62 | HR 84 | Temp 96.9°F | Resp 18 | Wt 262.9 lb

## 2020-10-22 VITALS — BP 110/62 | HR 84 | Temp 96.9°F | Resp 18 | Ht <= 58 in | Wt 262.9 lb

## 2020-10-22 DIAGNOSIS — C3492 Malignant neoplasm of unspecified part of left bronchus or lung: Secondary | ICD-10-CM | POA: Insufficient documentation

## 2020-10-22 DIAGNOSIS — Z87891 Personal history of nicotine dependence: Secondary | ICD-10-CM | POA: Diagnosis not present

## 2020-10-22 DIAGNOSIS — R918 Other nonspecific abnormal finding of lung field: Secondary | ICD-10-CM

## 2020-10-22 DIAGNOSIS — Z5111 Encounter for antineoplastic chemotherapy: Secondary | ICD-10-CM | POA: Insufficient documentation

## 2020-10-22 DIAGNOSIS — J449 Chronic obstructive pulmonary disease, unspecified: Secondary | ICD-10-CM | POA: Diagnosis not present

## 2020-10-22 DIAGNOSIS — C3412 Malignant neoplasm of upper lobe, left bronchus or lung: Secondary | ICD-10-CM | POA: Diagnosis not present

## 2020-10-22 DIAGNOSIS — I110 Hypertensive heart disease with heart failure: Secondary | ICD-10-CM | POA: Insufficient documentation

## 2020-10-22 DIAGNOSIS — C7951 Secondary malignant neoplasm of bone: Secondary | ICD-10-CM | POA: Insufficient documentation

## 2020-10-22 DIAGNOSIS — Z5112 Encounter for antineoplastic immunotherapy: Secondary | ICD-10-CM | POA: Diagnosis not present

## 2020-10-22 DIAGNOSIS — C3432 Malignant neoplasm of lower lobe, left bronchus or lung: Secondary | ICD-10-CM | POA: Diagnosis not present

## 2020-10-22 DIAGNOSIS — I509 Heart failure, unspecified: Secondary | ICD-10-CM | POA: Diagnosis not present

## 2020-10-22 DIAGNOSIS — C3490 Malignant neoplasm of unspecified part of unspecified bronchus or lung: Secondary | ICD-10-CM

## 2020-10-22 LAB — CBC WITH DIFFERENTIAL (CANCER CENTER ONLY)
Abs Immature Granulocytes: 0.03 10*3/uL (ref 0.00–0.07)
Basophils Absolute: 0 10*3/uL (ref 0.0–0.1)
Basophils Relative: 1 %
Eosinophils Absolute: 0.2 10*3/uL (ref 0.0–0.5)
Eosinophils Relative: 5 %
HCT: 39.1 % (ref 36.0–46.0)
Hemoglobin: 12.3 g/dL (ref 12.0–15.0)
Immature Granulocytes: 1 %
Lymphocytes Relative: 37 %
Lymphs Abs: 1.5 10*3/uL (ref 0.7–4.0)
MCH: 29.6 pg (ref 26.0–34.0)
MCHC: 31.5 g/dL (ref 30.0–36.0)
MCV: 94 fL (ref 80.0–100.0)
Monocytes Absolute: 0.5 10*3/uL (ref 0.1–1.0)
Monocytes Relative: 13 %
Neutro Abs: 1.8 10*3/uL (ref 1.7–7.7)
Neutrophils Relative %: 43 %
Platelet Count: 142 10*3/uL — ABNORMAL LOW (ref 150–400)
RBC: 4.16 MIL/uL (ref 3.87–5.11)
RDW: 13.2 % (ref 11.5–15.5)
WBC Count: 4 10*3/uL (ref 4.0–10.5)
nRBC: 0 % (ref 0.0–0.2)

## 2020-10-22 LAB — CMP (CANCER CENTER ONLY)
ALT: 124 U/L — ABNORMAL HIGH (ref 0–44)
AST: 98 U/L — ABNORMAL HIGH (ref 15–41)
Albumin: 2.9 g/dL — ABNORMAL LOW (ref 3.5–5.0)
Alkaline Phosphatase: 109 U/L (ref 38–126)
Anion gap: 10 (ref 5–15)
BUN: 13 mg/dL (ref 8–23)
CO2: 40 mmol/L — ABNORMAL HIGH (ref 22–32)
Calcium: 9.4 mg/dL (ref 8.9–10.3)
Chloride: 94 mmol/L — ABNORMAL LOW (ref 98–111)
Creatinine: 0.86 mg/dL (ref 0.44–1.00)
GFR, Estimated: >60 mL/min (ref >60)
Glucose, Bld: 86 mg/dL (ref 70–99)
Potassium: 3.9 mmol/L (ref 3.5–5.1)
Sodium: 144 mmol/L (ref 135–145)
Total Bilirubin: 0.4 mg/dL (ref 0.3–1.2)
Total Protein: 6.3 g/dL — ABNORMAL LOW (ref 6.5–8.1)

## 2020-10-22 NOTE — Progress Notes (Signed)
Per Dr. Julien Nordmann, referral to genetics completed.

## 2020-10-22 NOTE — Progress Notes (Signed)
Tiffany [phone]   [phone]) [phone] Multidisciplinary thoracic oncology clinic  CONSULT NOTE  REFERRING PHYSICIAN: Dr. Thurmon Fair, [facility]  REASON FOR CONSULTATION:  60 years old African-American female with lung cancer transferring her care to [facility].  HPI Tiffany Owen is a 60 y.o. female with past medical history significant for stage IV non-small cell lung cancer, adenocarcinoma diagnosed in May 2022, congestive heart failure, COPD, hypertension, pneumonia, prediabetes as well as long history of smoking but quit 9 years ago.  The patient mentions that she was seen by her primary care physician Dr. Basilia Jumbo in [facility] complaining of shortness of breath and COPD exacerbation.  She had imaging studies locally that showed suspicious lesion in the left lung.  The patient was referred to Dr. Tamsen Snider was pulmonary medicine and a PET scan was performed on 07/24/2020 and it showed moderate focal uptake in the 1.6 cm nodule in the left lung apex concerning for malignancy.  There were other multiple scattered subcentimeter pulmonary nodules in both lungs without abnormal uptake and may be too small to characterize but malignancy could not be excluded.  There was multiple small to mildly enlarged mediastinal lymph nodes and bulky right hilar and left hilar/posterior hilar adenopathy/mass with moderate focal uptake and metastatic adenopathy is suspected.  On Sep 02, 2020 the patient underwent bronchoscopy with endobronchial ultrasound and biopsy under the care of Dr. Franne Grip and the final pathology Case # [id number]) from [place] showed the fine-needle aspiration of the 4R as well as the subcarinal lymph nodes was consistent with metastatic adenocarcinoma.  The tissue block was sent to [facility] for molecular studies and that showed PD-L1 expression of 100%.  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 The patient was seen by Dr. Caesar Chestnut and started the first cycle of systemic chemotherapy with carboplatin for AUC of 5, Alimta 500 Mg/M2 and Keytruda 200 Mg IV on October 13, 2020. The patient tolerated the first week of her treatment well except for the baseline shortness of breath as well as fatigue.  Her shortness of breath secondary to COPD. She moved to [facility] to be close to her daughter and she came here today for evaluation and to establish care with me for continuation of her treatment and management of her condition. She had repeat CT scan of the chest performed on October 18, 2020 that showed 3.0 x 3.6 x 3.9 cm mass versus consolidation within the posterior medial left lower lobe suspicious for malignancy in addition to multiple bilateral pulmonary nodules and enlarged left hilar lymph nodes worrisome for metastatic disease.  The patient also has new 3 mm lytic lesion within the left fifth rib and left eighth rib suspicious for metastatic disease in addition to indeterminant 6 mm sclerosis in the right fifth rib.  She also has indeterminate 1.8 cm splenic lesion. When seen today she continues to have mild cough with shortness of breath at baseline increased with exertion.  Her oxygen saturation at room air without oxygen was 85% and with exercise was down to 81% after few steps.  The patient has improvement of her oxygen saturation to 96% on 3 L. She also complains of back pain.  She has no chest pain or hemoptysis.  She denied having any nausea, vomiting, diarrhea or constipation.  She has no headache or visual changes. Family history significant for mother, father and brother with lung cancer.  She also has a brother with bone cancer. The patient is single and has 3 children.  She was accompanied today by her daughter Tiffany Owen.  She used to work at a nursing home.  She has a history for smoking 1.5 pack/day for around 33 years but quit 9 years ago.  She also has a history of alcohol abuse  in the past but not recently has no history of drug abuse.  HPI  Past Medical History:  Diagnosis Date   Cancer ([place])    CHF (congestive heart failure) ([facility])    COPD (chronic obstructive pulmonary disease) ([place])    Hypertension    Pneumonia 04/17/2018    Past Surgical History:  Procedure Laterality Date   CESAREAN SECTION  1979; 1981; [place]    Family History  Problem Relation Age of Onset   Cancer Mother    Diabetes Mellitus II Father    Cancer Father    Cancer Brother     Social History Social History   Tobacco Use   Smokeless tobacco: Never  Vaping Use   Vaping Use: Never used  Substance Use Topics   Alcohol use: Never   Drug use: Never    Not on File  Current Outpatient Medications  Medication Sig Dispense Refill   acetaminophen (TYLENOL) 500 MG tablet Take 2 tablets (1,000 mg total) by mouth every 8 (eight) hours. 30 tablet 0   carvedilol (COREG) 12.5 MG tablet Take 12.5 mg by mouth 2 (two) times daily with a meal.     Cholecalciferol (VITAMIN D3 PO) Take 1 capsule by mouth daily.     Cyanocobalamin (VITAMIN B-12 PO) Take 1 tablet by mouth daily.     folic acid (FOLVITE) 1 MG tablet Take 1 mg by mouth See admin instructions. Take 1 mg daily by mouth starting 7 days before and continuing for 3 weeks after last chemo     guaiFENesin (ROBITUSSIN) 100 MG/5ML SOLN Take 15 mLs by mouth every 4 (four) hours as needed for cough or to loosen phlegm.     hydrochlorothiazide (HYDRODIURIL) 25 MG tablet Take 25 mg by mouth daily.     ibuprofen (ADVIL) 800 MG tablet Take 1 tablet (800 mg total) by mouth every 6 (six) hours as needed for mild pain. 30 tablet 0   ipratropium-albuterol (DUONEB) 0.5-2.5 (3) MG/3ML SOLN Take 3 mLs by nebulization every 6 (six) hours as needed. 360 mL 1   ipratropium-albuterol (DUONEB) 0.5-2.5 (3) MG/3ML SOLN Take 3 mLs by nebulization every 6 (six) hours as needed (for shortness of breath or wheezing).     irbesartan  (AVAPRO) 75 MG tablet Take 75 mg by mouth daily.     lidocaine (LIDODERM) 5 % Place 1 patch onto the skin daily. Remove & Discard patch within 12 hours or as directed by MD 30 patch 0   metFORMIN (GLUCOPHAGE-XR) 500 MG 24 hr tablet Take 500 mg by mouth daily with breakfast.     Multiple Vitamins-Minerals (ONE-A-DAY WOMENS PO) Take 1 tablet by mouth daily with breakfast.     omeprazole (PRILOSEC) 40 MG capsule      oxyCODONE (OXY IR/ROXICODONE) 5 MG immediate release tablet Take 1 tablet (5 mg total) by mouth every 8 (eight) hours as needed for moderate pain. 20 tablet 0   OXYGEN Inhale 3 L/min into the lungs continuous.     prochlorperazine (COMPAZINE) 10 MG tablet Take 10 mg by mouth every 6 (six) hours as needed for nausea  or vomiting.     Tiotropium Bromide-Olodaterol (STIOLTO RESPIMAT) 2.5-2.5 MCG/ACT AERS Inhale 2 puffs into the lungs daily. 1 Inhaler 0   No current facility-administered medications for this visit.    Review of Systems  Constitutional: positive for fatigue Eyes: negative Ears, nose, mouth, throat, and face: negative Respiratory: positive for cough and dyspnea on exertion Cardiovascular: negative Gastrointestinal: negative Genitourinary:negative Integument/breast: negative Hematologic/lymphatic: negative Musculoskeletal:positive for back pain Neurological: negative Behavioral/Psych: negative Endocrine: negative Allergic/Immunologic: negative  Physical Exam  AOZ:HYQMV, healthy, no distress, well nourished, well developed, and anxious SKIN: skin color, texture, turgor are normal, no rashes or significant lesions HEAD: Normocephalic, No masses, lesions, tenderness or abnormalities EYES: normal, PERRLA, Conjunctiva are pink and non-injected EARS: External ears normal, Canals clear OROPHARYNX:no exudate, no erythema, and lips, buccal mucosa, and tongue normal  NECK: supple, no adenopathy, no JVD LYMPH:  no palpable lymphadenopathy, no  hepatosplenomegaly BREAST:not examined LUNGS: clear to auscultation , and palpation HEART: regular rate & rhythm, no murmurs, and no gallops ABDOMEN:abdomen soft, non-tender, obese, normal bowel sounds, and no masses or organomegaly BACK: No CVA tenderness, Range of motion is normal EXTREMITIES:no joint deformities, effusion, or inflammation, no edema  NEURO: alert & oriented x 3 with fluent speech, no focal motor/sensory deficits  PERFORMANCE STATUS: ECOG 1  LABORATORY DATA: Lab Results  Component Value Date   WBC 4.0 10/22/2020   HGB 12.3 10/22/2020   HCT 39.1 10/22/2020   MCV 94.0 10/22/2020   PLT 142 (L) 10/22/2020      Chemistry      Component Value Date/Time   NA 136 10/19/2020 0122   K 4.2 10/19/2020 0122   CL 95 (L) 10/19/2020 0122   CO2 35 (H) 10/19/2020 0122   BUN 16 10/19/2020 0122   CREATININE 0.73 10/19/2020 0122      Component Value Date/Time   CALCIUM 8.8 (L) 10/19/2020 0122       RADIOGRAPHIC STUDIES: CT Head Wo Contrast  Result Date: 10/18/2020 CLINICAL DATA:  Mental status changes. EXAM: CT HEAD WITHOUT CONTRAST TECHNIQUE: Contiguous axial images were obtained from the base of the skull through the vertex without intravenous contrast. COMPARISON:  None. FINDINGS: Brain: There is no evidence for acute hemorrhage, hydrocephalus, mass lesion, or abnormal extra-axial fluid collection. No definite CT evidence for acute infarction. Intravascular contrast evident secondary to recent CTA Chest, limiting assessment for subtle subarachnoid hemorrhage. Patchy low attenuation in the deep hemispheric and periventricular white matter is nonspecific, but likely reflects chronic microvascular ischemic demyelination. Vascular: Persistent enhancement of blood pool from recent CTA Chest . Skull: No evidence for fracture. No worrisome lytic or sclerotic lesion. Sinuses/Orbits: The visualized paranasal sinuses and mastoid air cells are clear. Visualized portions of the globes and  intraorbital fat are unremarkable. Other: None. IMPRESSION: 1. No acute intracranial abnormality. 2. Atrophy with chronic small vessel white matter ischemic disease. Electronically Signed   By: Misty Stanley M.D.   On: 10/18/2020 13:27   CT Angio Chest PE W/Cm &/Or Wo Cm  Result Date: 10/18/2020 CLINICAL DATA:  60 year old female with shortness of breath and elevated D-dimer. EXAM: CT ANGIOGRAPHY CHEST WITH CONTRAST TECHNIQUE: Multidetector CT imaging of the chest was performed using the standard protocol during bolus administration of intravenous contrast. Multiplanar CT image reconstructions and MIPs were obtained to evaluate the vascular anatomy. CONTRAST:  168m OMNIPAQUE IOHEXOL 350 MG/ML SOLN COMPARISON:  04/15/2018 CT.  10/18/2020 prior radiographs FINDINGS: Cardiovascular: Satisfactory opacification of the pulmonary arteries to the segmental level. No evidence  of pulmonary embolism. Cardiomegaly is noted. No thoracic aortic aneurysm or pericardial effusion identified. Mediastinum/Nodes: Enlarged LEFT hilar nodes are identified with a 1.7 cm index node (series 6: Image 47). Other UPPER limits of normal sized mediastinal lymph nodes are identified. The thyroid gland, trachea and esophagus are unremarkable. Lungs/Pleura: A 3 x 3.6 x 3.9 cm mass versus masslike consolidation is noted within the posteromedial aspect of the LEFT LOWER lobe (8:50). Multiple primarily subcentimeter bilateral pulmonary nodules are identified, many which are new from 04/15/2018. Index nodules include a 1.4 x 1.4 x 2 cm LEFT apical nodule (8:15), a 8 mm LEFT UPPER lobe nodule (8:23) and 6 mm LEFT LOWER lobe nodule (8:62). Scattered areas of atelectasis within both lungs are identified. No pleural effusion or pneumothorax identified. Upper Abdomen: A nonspecific 1.8 cm hypodense splenic lesion (6:80) is noted. Probable hepatic steatosis identified. Musculoskeletal: New 3 mm lytic lesions within the LEFT 5th rib and LEFT 8th rib are  identified) 8:35 and 8:60. A 6 mm area of sclerosis within the anterior RIGHT 5th rib is noted. No other new bony abnormalities are identified. Review of the MIP images confirms the above findings. IMPRESSION: 1. 3 x 3.6 x 3.9 cm mass versus masslike consolidation within the posteromedial LEFT LOWER lobe - suspicious for malignancy. Multiple bilateral pulmonary nodules and enlarged LEFT hilar lymph nodes worrisome for metastatic disease. 2. New 3 mm lytic lesions within the LEFT 5th rib and LEFT 8th rib - suspicious for metastatic disease. Indeterminate 6 mm sclerosis within the RIGHT 5th rib. 3. Indeterminate 1.8 cm splenic lesion. Consider elective MRI for further evaluation. 4. No evidence of pulmonary emboli or thoracic aortic aneurysm. 5. Cardiomegaly. 6. Probable hepatic steatosis. Electronically Signed   By: Margarette Canada M.D.   On: 10/18/2020 11:18   DG Chest Portable 1 View  Result Date: 10/18/2020 CLINICAL DATA:  Shortness of breath. EXAM: PORTABLE CHEST 1 VIEW COMPARISON:  None. FINDINGS: Mild cardiomegaly is noted. Mild left midlung subsegmental atelectasis is noted. No pneumothorax or significant pleural effusion is noted. Right lung is clear. Bony thorax is unremarkable. IMPRESSION: Mild left midlung subsegmental atelectasis. Electronically Signed   By: Marijo Conception M.D.   On: 10/18/2020 08:13   ECHOCARDIOGRAM COMPLETE  Result Date: 10/19/2020    ECHOCARDIOGRAM REPORT   Patient Name:   Tiffany Owen Date of Exam: 10/19/2020 Medical Rec #:  [id number]  Height:       58.0 in Accession #:    [id number] Weight:       271.2 lb Date of Birth:  [birth year]  BSA:          2.073 m Patient Age:    23 years   BP:           138/78 mmHg Patient Gender: F          HR:           95 bpm. Exam Location:  Inpatient Procedure: 2D Echo, Cardiac Doppler and Color Doppler Indications:     Congestive heart failure  History:         Patient has prior history of Echocardiogram examinations, most                  recent  04/16/2018. COPD; Risk Factors:Hypertension,                  Dyslipidemia, Former Smoker and Sleep Apnea.  Sonographer:     Clayton Lefort RDCS (AE) Referring Phys:  [phone] [facility]  [facility] Diagnosing Phys: Charolette Forward MD IMPRESSIONS  1. Left ventricular ejection fraction, by estimation, is 55 to 60%. The left ventricle has normal function. The left ventricle has no regional wall motion abnormalities. Left ventricular diastolic parameters were normal.  2. Right ventricular systolic function is normal. The right ventricular size is normal. There is mildly elevated pulmonary artery systolic pressure.  3. There is no evidence of cardiac tamponade.  4. The mitral valve is normal in structure. Trivial mitral valve regurgitation.  5. The aortic valve is normal in structure. Aortic valve regurgitation is not visualized. No aortic stenosis is present.  6. The inferior vena cava is normal in size with greater than 50% respiratory variability, suggesting right atrial pressure of 3 mmHg. FINDINGS  Left Ventricle: Left ventricular ejection fraction, by estimation, is 55 to 60%. The left ventricle has normal function. The left ventricle has no regional wall motion abnormalities. The left ventricular internal cavity size was normal in size. There is  no left ventricular hypertrophy. Left ventricular diastolic parameters were normal. Right Ventricle: The right ventricular size is normal. No increase in right ventricular wall thickness. Right ventricular systolic function is normal. There is mildly elevated pulmonary artery systolic pressure. The tricuspid regurgitant velocity is 2.99  m/s, and with an assumed right atrial pressure of 3 mmHg, the estimated right ventricular systolic pressure is 99.3 mmHg. Left Atrium: Left atrial size was normal in size. Right Atrium: Right atrial size was normal in size. Pericardium: Trivial pericardial effusion is present. There is no evidence of cardiac tamponade. Mitral Valve: The  mitral valve is normal in structure. Trivial mitral valve regurgitation. MV peak gradient, 8.0 mmHg. The mean mitral valve gradient is 3.0 mmHg. Tricuspid Valve: The tricuspid valve is normal in structure. Tricuspid valve regurgitation is trivial. Aortic Valve: The aortic valve is normal in structure. Aortic valve regurgitation is not visualized. No aortic stenosis is present. Aortic valve mean gradient measures 7.0 mmHg. Aortic valve peak gradient measures 13.0 mmHg. Aortic valve area, by VTI measures 2.10 cm. Pulmonic Valve: The pulmonic valve was normal in structure. Pulmonic valve regurgitation is not visualized. Aorta: The aortic root is normal in size and structure. Venous: The inferior vena cava is normal in size with greater than 50% respiratory variability, suggesting right atrial pressure of 3 mmHg. IAS/Shunts: No atrial level shunt detected by color flow Doppler.  LEFT VENTRICLE PLAX 2D LVIDd:         3.70 cm  Diastology LVIDs:         2.30 cm  LV e' medial:    8.81 cm/s LV PW:         1.40 cm  LV E/e' medial:  13.2 LV IVS:        1.00 cm  LV e' lateral:   9.36 cm/s LVOT diam:     1.90 cm  LV E/e' lateral: 12.4 LV SV:         65 LV SV Index:   32 LVOT Area:     2.84 cm  RIGHT VENTRICLE             IVC RV Basal diam:  3.30 cm     IVC diam: 1.80 cm RV S prime:     14.60 cm/s TAPSE (M-mode): 2.9 cm LEFT ATRIUM             Index       RIGHT ATRIUM           Index LA diam:  4.00 cm 1.93 cm/m  RA Area:     16.40 cm LA Vol (A2C):   76.0 ml 36.67 ml/m RA Volume:   47.60 ml  22.97 ml/m LA Vol (A4C):   68.8 ml 33.19 ml/m LA Biplane Vol: 74.7 ml 36.04 ml/m  AORTIC VALVE AV Area (Vmax):    2.17 cm AV Area (Vmean):   2.08 cm AV Area (VTI):     2.10 cm AV Vmax:           180.00 cm/s AV Vmean:          127.000 cm/s AV VTI:            0.312 m AV Peak Grad:      13.0 mmHg AV Mean Grad:      7.0 mmHg LVOT Vmax:         138.00 cm/s LVOT Vmean:        93.200 cm/s LVOT VTI:          0.231 m LVOT/AV VTI ratio:  0.74  AORTA Ao Root diam: 3.20 cm Ao Asc diam:  3.60 cm MITRAL VALVE                TRICUSPID VALVE MV Area (PHT): 2.64 cm     TR Peak grad:   35.8 mmHg MV Area VTI:   1.96 cm     TR Vmax:        299.00 cm/s MV Peak grad:  8.0 mmHg MV Mean grad:  3.0 mmHg     SHUNTS MV Vmax:       1.41 m/s     Systemic VTI:  0.23 m MV Vmean:      86.6 cm/s    Systemic Diam: 1.90 cm MV Decel Time: 287 msec MV E velocity: 116.00 cm/s MV A velocity: 128.00 cm/s MV E/A ratio:  0.91 Charolette Forward MD Electronically signed by Charolette Forward MD Signature Date/Time: 10/19/2020/10:36:35 AM    Final     ASSESSMENT: This is a very pleasant 60 years old African-American female recently diagnosed with stage IV (T2a, N2, M1b) non-small cell lung cancer, adenocarcinoma diagnosed in May 2022 and presented with left lower lobe lung mass in addition to left hilar and mediastinal lymphadenopathy as well as bilateral pulmonary nodules and metastatic disease to the ribs bilaterally. 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 The patient already started systemic chemotherapy at [facility] on 10/13/2020 with carboplatin for AUC of 5, Alimta 500 Mg/M2 and Keytruda 200 Mg IV every 3 weeks.  Status post 1 cycle.  PLAN: I had a lengthy discussion with the patient and her daughter today about her current disease stage, prognosis and treatment options. The patient understands that she has incurable condition and all the treatment are of palliative nature.  She had repeat CT scan of the chest performed on October 18, 2020 that showed further disease progression compared to the previous PET scan. I gave the patient the option of palliative care versus continuation with the palliative systemic chemotherapy that she already started in [place] at [facility] with carboplatin for AUC of 5, Alimta 500 Mg/M2 and Keytruda 200 Mg IV every 3 weeks. The patient will not be a great  candidate for immunotherapy monotherapy with Keytruda or Libtayo (Cempilimab) because of the positive EGFR mutation in exon 20 and her best option would be either systemic chemotherapy alone or any combination with immunotherapy. If the  patient has evidence for disease progression after the first-line treatment, she will be treated with targeted therapy with Mobocertinib (Axkibity) or Amivantamab. The patient is interested in continuation with her current treatment and she is expected to start cycle #2 on November 03, 2020. For the shortness of breath and COPD, we will order home oxygen for this patient since she desaturated at rest and also with exercise. I will complete the staging work-up by ordering MRI of the brain to rule out brain metastasis. For the back pain the patient was seen by Dr. Sondra Come and he is ordering MRI of the thoracic spine to rule out any other suspicious lesion in that area that need palliative radiotherapy. The patient will come back for follow-up visit in around 2 weeks for evaluation before starting cycle #2. Because of the strong family history of malignancy, we will refer the patient for genetic counseling. I will also refer the patient to pulmonary medicine for evaluation and management of her COPD. She was advised to call immediately if she has any other concerning symptoms in the interval. The patient voices understanding of current disease status and treatment options and is in agreement with the current care plan.  All questions were answered. The patient knows to call the clinic with any problems, questions or concerns. We can certainly see the patient much sooner if necessary.  Thank you so much for allowing me to participate in the care of Tiffany Owen. I will continue to follow up the patient with you and assist in her care. The total time spent in the appointment was 90 minutes.  Disclaimer: This note was dictated with voice recognition software. Similar sounding  words can inadvertently be transcribed and may not be corrected upon review.   Eilleen Kempf October 22, 2020, 1:55 PM

## 2020-10-22 NOTE — Progress Notes (Signed)
START OFF PATHWAY REGIMEN - Non-Small Cell Lung   [id number]:Pembrolizumab 200 mg  IV D1 + Pemetrexed 500 mg/m2 IV D1 + Carboplatin AUC=5 IV D1 q21 Days:   A cycle is every 21 days:     Pembrolizumab      Pemetrexed      Carboplatin   **Always confirm dose/schedule in your pharmacy ordering system**  Patient Characteristics: Stage IV Metastatic, Nonsquamous, Molecular Analysis Completed, Molecular Alteration Present and Targeted Therapy Exhausted OR EGFR Exon 20+ or KRAS G12C+ Present and No Prior Chemo/Immunotherapy OR No Alteration Present, Initial  Chemotherapy/Immunotherapy, PS = 0, 1, EGFR Mutation Positive Therapeutic Status: Stage IV Metastatic Histology: Nonsquamous Cell Broad Molecular Profiling Status: Molecular Analysis Completed Molecular Analysis Results: EGFR Exon 20 Insertion Present or KRAS G12C Present, and No Prior Chemo/Immunotherapy ECOG Performance Status: 1 Chemotherapy/Immunotherapy Line of Therapy: Initial Chemotherapy/Immunotherapy Intent of Therapy: Non-Curative / Palliative Intent, Discussed with Patient

## 2020-10-22 NOTE — Telephone Encounter (Signed)
Attempted to contact patient's daughter [name] to schedule a Palliative Care consult appointment. No answer and voicemail is full.

## 2020-10-22 NOTE — Progress Notes (Signed)
The proposed treatment discussed in conference is for discussion purpose only and is not a binding recommendation.  The patients have not been physically examined, or presented with their treatment options.  Therefore, final treatment plans cannot be decided.  

## 2020-10-22 NOTE — Progress Notes (Signed)
Spoke with Tiffany Owen and her daughter today.  She in new patient of Dr. Worthy Flank but has been receiving treatment in [place].  She will continue her current treatment of every three weeks chemo and IO therapy.  Patient and daughter verbalized understanding of plan of care.

## 2020-10-23 ENCOUNTER — Telehealth: Payer: Self-pay | Admitting: Internal Medicine

## 2020-10-23 NOTE — Progress Notes (Signed)
SATURATION QUALIFICATIONS: (This note is used to comply with regulatory documentation for home oxygen)  Patient Saturations on Room Air at Rest = 85%  Patient Saturations on Room Air while Ambulating = 81%  Patient Saturations on 3 Liters of oxygen while Ambulating = 96%  Please briefly explain why patient needs home oxygen:  No other means available to increase oxygen saturation.

## 2020-10-23 NOTE — Telephone Encounter (Signed)
Scheduled appt per 7/7 sch msg. Pt's daughter is aware.

## 2020-10-23 NOTE — Progress Notes (Signed)
Ordered oxygen via parachute.( [employer])

## 2020-10-24 DIAGNOSIS — I5033 Acute on chronic diastolic (congestive) heart failure: Secondary | ICD-10-CM | POA: Diagnosis not present

## 2020-10-26 ENCOUNTER — Inpatient Hospital Stay (HOSPITAL_BASED_OUTPATIENT_CLINIC_OR_DEPARTMENT_OTHER): Payer: BC Managed Care – PPO | Admitting: Genetic Counselor

## 2020-10-26 ENCOUNTER — Other Ambulatory Visit: Payer: Self-pay

## 2020-10-26 ENCOUNTER — Inpatient Hospital Stay: Payer: BC Managed Care – PPO

## 2020-10-26 DIAGNOSIS — C3490 Malignant neoplasm of unspecified part of unspecified bronchus or lung: Secondary | ICD-10-CM | POA: Diagnosis not present

## 2020-10-26 DIAGNOSIS — J449 Chronic obstructive pulmonary disease, unspecified: Secondary | ICD-10-CM | POA: Diagnosis not present

## 2020-10-26 DIAGNOSIS — G4733 Obstructive sleep apnea (adult) (pediatric): Secondary | ICD-10-CM | POA: Diagnosis not present

## 2020-10-26 DIAGNOSIS — Z801 Family history of malignant neoplasm of trachea, bronchus and lung: Secondary | ICD-10-CM

## 2020-10-26 DIAGNOSIS — C3492 Malignant neoplasm of unspecified part of left bronchus or lung: Secondary | ICD-10-CM | POA: Diagnosis not present

## 2020-10-26 DIAGNOSIS — J9611 Chronic respiratory failure with hypoxia: Secondary | ICD-10-CM | POA: Diagnosis not present

## 2020-10-27 DIAGNOSIS — C7951 Secondary malignant neoplasm of bone: Secondary | ICD-10-CM | POA: Diagnosis not present

## 2020-10-27 DIAGNOSIS — C3490 Malignant neoplasm of unspecified part of unspecified bronchus or lung: Secondary | ICD-10-CM | POA: Diagnosis not present

## 2020-10-27 DIAGNOSIS — G4730 Sleep apnea, unspecified: Secondary | ICD-10-CM | POA: Diagnosis not present

## 2020-10-28 ENCOUNTER — Inpatient Hospital Stay: Payer: BC Managed Care – PPO

## 2020-10-28 ENCOUNTER — Other Ambulatory Visit: Payer: Self-pay

## 2020-10-28 ENCOUNTER — Encounter: Payer: Self-pay | Admitting: Internal Medicine

## 2020-10-28 DIAGNOSIS — C3492 Malignant neoplasm of unspecified part of left bronchus or lung: Secondary | ICD-10-CM | POA: Diagnosis not present

## 2020-10-28 DIAGNOSIS — I110 Hypertensive heart disease with heart failure: Secondary | ICD-10-CM | POA: Diagnosis not present

## 2020-10-28 DIAGNOSIS — Z87891 Personal history of nicotine dependence: Secondary | ICD-10-CM | POA: Diagnosis not present

## 2020-10-28 DIAGNOSIS — I509 Heart failure, unspecified: Secondary | ICD-10-CM | POA: Diagnosis not present

## 2020-10-28 DIAGNOSIS — J449 Chronic obstructive pulmonary disease, unspecified: Secondary | ICD-10-CM | POA: Diagnosis not present

## 2020-10-28 DIAGNOSIS — C7951 Secondary malignant neoplasm of bone: Secondary | ICD-10-CM | POA: Diagnosis not present

## 2020-10-28 DIAGNOSIS — Z5111 Encounter for antineoplastic chemotherapy: Secondary | ICD-10-CM | POA: Diagnosis not present

## 2020-10-28 DIAGNOSIS — Z5112 Encounter for antineoplastic immunotherapy: Secondary | ICD-10-CM | POA: Diagnosis not present

## 2020-10-28 LAB — CMP (CANCER CENTER ONLY)
ALT: 97 U/L — ABNORMAL HIGH (ref 0–44)
AST: 49 U/L — ABNORMAL HIGH (ref 15–41)
Albumin: 2.9 g/dL — ABNORMAL LOW (ref 3.5–5.0)
Alkaline Phosphatase: 98 U/L (ref 38–126)
Anion gap: 8 (ref 5–15)
BUN: 12 mg/dL (ref 8–23)
CO2: 34 mmol/L — ABNORMAL HIGH (ref 22–32)
Calcium: 9.7 mg/dL (ref 8.9–10.3)
Chloride: 103 mmol/L (ref 98–111)
Creatinine: 0.92 mg/dL (ref 0.44–1.00)
GFR, Estimated: >60 mL/min (ref >60)
Glucose, Bld: 94 mg/dL (ref 70–99)
Potassium: 4.7 mmol/L (ref 3.5–5.1)
Sodium: 145 mmol/L (ref 135–145)
Total Bilirubin: <0.2 mg/dL — ABNORMAL LOW (ref 0.3–1.2)
Total Protein: 6.7 g/dL (ref 6.5–8.1)

## 2020-10-28 LAB — CBC WITH DIFFERENTIAL (CANCER CENTER ONLY)
Abs Immature Granulocytes: 0.12 10*3/uL — ABNORMAL HIGH (ref 0.00–0.07)
Basophils Absolute: 0.1 10*3/uL (ref 0.0–0.1)
Basophils Relative: 1 %
Eosinophils Absolute: 0.3 10*3/uL (ref 0.0–0.5)
Eosinophils Relative: 4 %
HCT: 41.7 % (ref 36.0–46.0)
Hemoglobin: 13 g/dL (ref 12.0–15.0)
Immature Granulocytes: 2 %
Lymphocytes Relative: 34 %
Lymphs Abs: 2 10*3/uL (ref 0.7–4.0)
MCH: 29.7 pg (ref 26.0–34.0)
MCHC: 31.2 g/dL (ref 30.0–36.0)
MCV: 95.4 fL (ref 80.0–100.0)
Monocytes Absolute: 0.7 10*3/uL (ref 0.1–1.0)
Monocytes Relative: 11 %
Neutro Abs: 2.9 10*3/uL (ref 1.7–7.7)
Neutrophils Relative %: 48 %
Platelet Count: 354 10*3/uL (ref 150–400)
RBC: 4.37 MIL/uL (ref 3.87–5.11)
RDW: 13.6 % (ref 11.5–15.5)
WBC Count: 6 10*3/uL (ref 4.0–10.5)
nRBC: 0 % (ref 0.0–0.2)

## 2020-10-28 LAB — TSH: TSH: 0.546 u[IU]/mL (ref 0.308–3.960)

## 2020-10-28 NOTE — Progress Notes (Signed)
Met with patient and accompanying adult at registration to introduce myself as Financial Resource Specialist and to offer available resources.  Discussed one-time $1000 [employer] grant and qualifications to assist with personal expenses while going through treatment.  Gave her my card if interested in applying and for any additional financial questions or concerns.   

## 2020-10-29 ENCOUNTER — Encounter: Payer: Self-pay | Admitting: Internal Medicine

## 2020-10-29 ENCOUNTER — Encounter: Payer: Self-pay | Admitting: Genetic Counselor

## 2020-10-29 DIAGNOSIS — Z801 Family history of malignant neoplasm of trachea, bronchus and lung: Secondary | ICD-10-CM

## 2020-10-29 HISTORY — DX: Family history of malignant neoplasm of trachea, bronchus and lung: Z80.1

## 2020-10-29 NOTE — Progress Notes (Signed)
REFERRING PROVIDER: Curt Bears, MD [address],  [place] [address]  PRIMARY PROVIDER:  Lujean Amel, MD  PRIMARY REASON FOR VISIT:  1. Adenocarcinoma of left lung, stage 4 ([place])   2. Family history of lung cancer     HISTORY OF PRESENT ILLNESS:   Tiffany Owen, a 60 y.o. female, was seen for a [place] cancer genetics consultation at the request of Dr. Julien Nordmann due to a personal and family history of cancer.  Tiffany Owen presents to clinic today to discuss the possibility of a hereditary predisposition to cancer, to discuss genetic testing, and to further clarify her future cancer risks, as well as potential cancer risks for family members.   In 2022, at the age of 53, Tiffany Owen was diagnosed with non-small cell lung cancer, adenocarcinoma. The treatment plan includes chemotherapy.    CANCER HISTORY:  Oncology History  Adenocarcinoma of left lung, stage 4 ([place])  10/22/2020 Initial Diagnosis   Adenocarcinoma of left lung, stage 4 ([place])    10/22/2020 Cancer Staging   Staging form: Lung, AJCC 8th Edition - Clinical: Stage IVA Laney Pastor, cN2, cM1b) - Signed by Curt Bears, MD on 10/22/2020    11/02/2020 -  Chemotherapy    Patient is on Treatment Plan: LUNG CARBOPLATIN / PEMETREXED / PEMBROLIZUMAB Q21D INDUCTION X 4 CYCLES / MAINTENANCE PEMETREXED + PEMBROLIZUMAB          RISK FACTORS:  Ovaries intact: yes.  Hysterectomy: no.  Colonoscopy: unknown Mammogram within the last year: no; approximately 2 years ago Number of breast biopsies: 0. No dermatology Smoking history: 1.5 pack/day for 33 years; quit approx 9 years ago   Past Medical History:  Diagnosis Date   Cancer ([place])    CHF (congestive heart failure) ([facility])    COPD (chronic obstructive pulmonary disease) ([place])    Family history of lung cancer 10/29/2020   Hypertension    Pneumonia 04/17/2018    Past Surgical History:  Procedure Laterality Date   CESAREAN SECTION  1979; 1981; 1992   TUBAL  LIGATION  1992    Social History   Socioeconomic History   Marital status: Single    Spouse name: Not on file   Number of children: Not on file   Years of education: Not on file   Highest education level: Not on file  Occupational History   Not on file  Tobacco Use   Smoking status: Not on file   Smokeless tobacco: Never  Vaping Use   Vaping Use: Never used  Substance and Sexual Activity   Alcohol use: Never   Drug use: Never   Sexual activity: Not on file  Other Topics Concern   Not on file  Social History Narrative   ** Merged History Encounter **       Social Determinants of Health   Financial Resource Strain: Not on file  Food Insecurity: Not on file  Transportation Needs: Not on file  Physical Activity: Not on file  Stress: Not on file  Social Connections: Not on file     FAMILY HISTORY:  We obtained a detailed, 4-generation family history.  Significant diagnoses are listed below: Family History  Problem Relation Age of Onset   Lung cancer Mother        dx 12s; no smoking hx   Diabetes Mellitus II Father    Lung cancer Father        dx 74s; smoking hx   Lung cancer Brother  d. >50; smoking hx   Bone cancer Brother        dx >50   Cancer Maternal Aunt        unknown type; dx after 18    Tiffany Owen is unaware of previous family history of genetic testing for hereditary cancer risks. There is no reported Ashkenazi Jewish ancestry. There is no known consanguinity.  GENETIC COUNSELING ASSESSMENT: Tiffany Owen is a 60 y.o. female with a personal and family history of cancer which is not overly concerning for a known hereditary cancer syndrome. We, therefore, discussed and recommended the following at today's visit.   DISCUSSION: We discussed that, in general, most cancer is not inherited in families, but instead is sporadic or familial. Sporadic cancers occur by chance and typically happen at older ages (>50 years) as this type of cancer is caused by genetic  changes acquired during an individual's lifetime. Some families have more cancers than would be expected by chance; however, the ages or types of cancer are not consistent with a known genetic mutation or known genetic mutations have been ruled out. This type of familial cancer is thought to be due to a combination of multiple genetic, environmental, hormonal, and lifestyle factors. While this combination of factors likely increases the risk of cancer, the exact source of this risk is not currently identifiable or testable.    We discussed that approximately 5-10% of cancer is hereditary, meaning that it is due to a mutation in a single gene that is passed down from generation to generation in a family.  We discussed that there are not many genes identified at this point in time that are known to be associated with hereditary lung cancer.  We briefly discussed the EGFR variant detected in her tumor and discussed that this variant is not known to be commonly found in the germline.  We discussed that testing can be beneficial in some individuals for several reasons, including knowing about other cancer risks, identifying potential screening and risk-reduction options that may be appropriate, and to understand if other family members could be at risk for cancer and allow them to undergo genetic testing.  We discussed with Tiffany Owen that the personal and family history does not meet insurance or NCCN criteria for genetic testing and, therefore, is not highly consistent with a known hereditary cancer syndrome.  We feel she is at low risk to harbor a gene mutation associated with such a condition. Thus, we did not recommend any genetic testing, at this time, and recommended Tiffany Owen continue to follow the cancer screening and management guidelines given by her oncology and primary healthcare providers.  We discussed that there are some research studies available with the goal of learning more about the familial and  genetic factors that contribute to lung cancer, such as the [facility].  We discussed that, with research studies such as this, participants may not benefit directly from this study but they may help researchers understand more about the genetic causes of lung cancer.  Tiffany Owen was not interested in learning more about research studies associated with the genetic and familial links of lung cancer.   We discussed that given the presence of lung cancer in the family, other people in the family may be at an increased chance of having lung cancer and other cancers in their lifetime.  We reviewed the guidelines for those that qualify for lung cancer screening. Family members interested in this screening should speak with their  health care providers about this.   PLAN: Tiffany Owen did not wish to pursue genetic testing at today's visit. We remain available to coordinate genetic testing at any time in the future, if needed. We, therefore, recommend Tiffany Owen continue to follow the cancer screening and management guidelines given by her oncology and primary healthcare providers.  Lastly, we encouraged Tiffany Owen to remain in contact with cancer genetics annually so that we can continuously update the family history and inform her of any changes in cancer genetics and testing that may be of benefit for this family.   Tiffany Owen questions were answered to her satisfaction today. Our contact information was provided should additional questions or concerns arise. Thank you for the referral and allowing [place] to share in the care of your patient.   Tiffany Owen M. Joette Catching, [place], [facility] Genetic Counselor [email or web address] (P) [phone]  The patient was seen for a total of 30 minutes in face-to-face genetic counseling.  The patient brought her daughter to her appointment.  Drs. Magrinat, Lindi Adie and/or Burr Medico were available to discuss this case as needed.     _______________________________________________________________________ For Office Staff:  Number of people involved in session: 2 Was an Intern/ student involved with case: yes; prospective genetic counseling student, Tiffany Owen, observed this session

## 2020-10-30 ENCOUNTER — Encounter (HOSPITAL_COMMUNITY): Payer: Self-pay

## 2020-10-30 ENCOUNTER — Ambulatory Visit (HOSPITAL_COMMUNITY)
Admission: RE | Admit: 2020-10-30 | Discharge: 2020-10-30 | Disposition: A | Discharge status: Home / Self Care | Payer: BC Managed Care – PPO | Source: Ambulatory Visit | Attending: Internal Medicine | Admitting: Internal Medicine

## 2020-10-30 ENCOUNTER — Other Ambulatory Visit: Payer: Self-pay

## 2020-10-30 ENCOUNTER — Telehealth: Payer: Self-pay

## 2020-10-30 DIAGNOSIS — C3492 Malignant neoplasm of unspecified part of left bronchus or lung: Secondary | ICD-10-CM

## 2020-10-30 DIAGNOSIS — C3490 Malignant neoplasm of unspecified part of unspecified bronchus or lung: Secondary | ICD-10-CM

## 2020-10-30 NOTE — Telephone Encounter (Signed)
Spoke with patient's daughter [name]. She requested a call back next week to schedule Palliative consult.

## 2020-11-02 ENCOUNTER — Other Ambulatory Visit: Payer: Self-pay

## 2020-11-02 ENCOUNTER — Inpatient Hospital Stay (HOSPITAL_BASED_OUTPATIENT_CLINIC_OR_DEPARTMENT_OTHER): Payer: BC Managed Care – PPO | Admitting: Internal Medicine

## 2020-11-02 ENCOUNTER — Inpatient Hospital Stay: Payer: BC Managed Care – PPO

## 2020-11-02 ENCOUNTER — Encounter: Payer: Self-pay | Admitting: Internal Medicine

## 2020-11-02 VITALS — BP 128/46 | HR 87 | Temp 97.2°F | Resp 20 | Ht <= 58 in | Wt 261.9 lb

## 2020-11-02 DIAGNOSIS — Z5111 Encounter for antineoplastic chemotherapy: Secondary | ICD-10-CM | POA: Diagnosis not present

## 2020-11-02 DIAGNOSIS — Z5112 Encounter for antineoplastic immunotherapy: Secondary | ICD-10-CM

## 2020-11-02 DIAGNOSIS — J449 Chronic obstructive pulmonary disease, unspecified: Secondary | ICD-10-CM | POA: Diagnosis not present

## 2020-11-02 DIAGNOSIS — I110 Hypertensive heart disease with heart failure: Secondary | ICD-10-CM | POA: Diagnosis not present

## 2020-11-02 DIAGNOSIS — C3492 Malignant neoplasm of unspecified part of left bronchus or lung: Secondary | ICD-10-CM

## 2020-11-02 DIAGNOSIS — C7951 Secondary malignant neoplasm of bone: Secondary | ICD-10-CM | POA: Diagnosis not present

## 2020-11-02 DIAGNOSIS — Z87891 Personal history of nicotine dependence: Secondary | ICD-10-CM | POA: Diagnosis not present

## 2020-11-02 DIAGNOSIS — I509 Heart failure, unspecified: Secondary | ICD-10-CM | POA: Diagnosis not present

## 2020-11-02 LAB — CMP (CANCER CENTER ONLY)
ALT: 55 U/L — ABNORMAL HIGH (ref 0–44)
AST: 40 U/L (ref 15–41)
Albumin: 3.1 g/dL — ABNORMAL LOW (ref 3.5–5.0)
Alkaline Phosphatase: 93 U/L (ref 38–126)
Anion gap: 9 (ref 5–15)
BUN: 11 mg/dL (ref 8–23)
CO2: 33 mmol/L — ABNORMAL HIGH (ref 22–32)
Calcium: 10.2 mg/dL (ref 8.9–10.3)
Chloride: 100 mmol/L (ref 98–111)
Creatinine: 0.81 mg/dL (ref 0.44–1.00)
GFR, Estimated: >60 mL/min (ref >60)
Glucose, Bld: 97 mg/dL (ref 70–99)
Potassium: 4 mmol/L (ref 3.5–5.1)
Sodium: 142 mmol/L (ref 135–145)
Total Bilirubin: 0.3 mg/dL (ref 0.3–1.2)
Total Protein: 7.1 g/dL (ref 6.5–8.1)

## 2020-11-02 LAB — CBC WITH DIFFERENTIAL (CANCER CENTER ONLY)
Abs Immature Granulocytes: 0.03 10*3/uL (ref 0.00–0.07)
Basophils Absolute: 0.1 10*3/uL (ref 0.0–0.1)
Basophils Relative: 1 %
Eosinophils Absolute: 0.2 10*3/uL (ref 0.0–0.5)
Eosinophils Relative: 3 %
HCT: 42 % (ref 36.0–46.0)
Hemoglobin: 13.3 g/dL (ref 12.0–15.0)
Immature Granulocytes: 1 %
Lymphocytes Relative: 26 %
Lymphs Abs: 1.6 10*3/uL (ref 0.7–4.0)
MCH: 29.2 pg (ref 26.0–34.0)
MCHC: 31.7 g/dL (ref 30.0–36.0)
MCV: 92.3 fL (ref 80.0–100.0)
Monocytes Absolute: 0.8 10*3/uL (ref 0.1–1.0)
Monocytes Relative: 13 %
Neutro Abs: 3.7 10*3/uL (ref 1.7–7.7)
Neutrophils Relative %: 56 %
Platelet Count: 746 10*3/uL — ABNORMAL HIGH (ref 150–400)
RBC: 4.55 MIL/uL (ref 3.87–5.11)
RDW: 13.9 % (ref 11.5–15.5)
WBC Count: 6.3 10*3/uL (ref 4.0–10.5)
nRBC: 0 % (ref 0.0–0.2)

## 2020-11-02 MED ORDER — SODIUM CHLORIDE 0.9 % IV SOLN
750.0000 mg | Freq: Once | INTRAVENOUS | Status: AC
  Administered 2020-11-02: 750 mg via INTRAVENOUS
  Filled 2020-11-02: qty 75

## 2020-11-02 MED ORDER — SODIUM CHLORIDE 0.9 % IV SOLN
500.0000 mg/m2 | Freq: Once | INTRAVENOUS | Status: AC
  Administered 2020-11-02: 1100 mg via INTRAVENOUS
  Filled 2020-11-02: qty 40

## 2020-11-02 MED ORDER — SODIUM CHLORIDE 0.9 % IV SOLN
10.0000 mg | Freq: Once | INTRAVENOUS | Status: AC
  Administered 2020-11-02: 10 mg via INTRAVENOUS
  Filled 2020-11-02: qty 10

## 2020-11-02 MED ORDER — SODIUM CHLORIDE 0.9 % IV SOLN
200.0000 mg | Freq: Once | INTRAVENOUS | Status: AC
  Administered 2020-11-02: 200 mg via INTRAVENOUS
  Filled 2020-11-02: qty 8

## 2020-11-02 MED ORDER — LORAZEPAM 0.5 MG PO TABS: ORAL_TABLET | ORAL | 0 refills | Status: AC

## 2020-11-02 MED ORDER — PALONOSETRON HCL INJECTION 0.25 MG/5ML
0.2500 mg | Freq: Once | INTRAVENOUS | Status: AC
  Administered 2020-11-02: 0.25 mg via INTRAVENOUS

## 2020-11-02 MED ORDER — SODIUM CHLORIDE 0.9 % IV SOLN
150.0000 mg | Freq: Once | INTRAVENOUS | Status: AC
  Administered 2020-11-02: 150 mg via INTRAVENOUS
  Filled 2020-11-02: qty 150

## 2020-11-02 MED ORDER — FOLIC ACID 1 MG PO TABS: 1.0000 mg | ORAL_TABLET | ORAL | 2 refills | Status: DC

## 2020-11-02 MED ORDER — PALONOSETRON HCL INJECTION 0.25 MG/5ML
INTRAVENOUS | Status: AC
  Filled 2020-11-02: qty 5

## 2020-11-02 MED ORDER — SODIUM CHLORIDE 0.9 % IV SOLN
Freq: Once | INTRAVENOUS | Status: AC
Start: 2020-11-02 — End: 2020-11-02
  Filled 2020-11-02: qty 250

## 2020-11-02 NOTE — Progress Notes (Signed)
[facility] [phone]   [phone]  OFFICE PROGRESS NOTE  Koirala, Dibas, MD [address] [place] [address]  DIAGNOSIS: Stage IV (T2a, N2, M1b) non-small cell lung cancer, adenocarcinoma diagnosed in May 2022 and presented with left lower lobe lung mass in addition to left hilar and mediastinal lymphadenopathy as well as bilateral pulmonary nodules and metastatic disease to the ribs bilaterally. 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  PRIOR THERAPY: None  CURRENT THERAPY: Systemic chemotherapy with carboplatin for AUC of 5, Alimta 500 Mg/M2 and Keytruda 200 Mg IV every 3 weeks.  Started at [facility] on 10/13/2020. Status post 1 cycle.  INTERVAL HISTORY: Tiffany Owen 60 y.o. female returns to the clinic today for follow-up visit accompanied by her daughter.  The patient continues to have the baseline shortness of breath and she is currently on home oxygen.  She missed her appointment for the MRI of the brain because she is claustrophobic and could not tolerate the procedure.  She is requesting something for claustrophobia before the procedure.  She denied having any current chest pain, cough or hemoptysis.  She denied having any fever or chills.  She has no nausea, vomiting, diarrhea or constipation.  She has no headache or visual changes.  The patient is here today for evaluation before starting cycle #2 of her treatment.  MEDICAL HISTORY: Past Medical History:  Diagnosis Date   Cancer [facility])    CHF (congestive heart failure) (HCC)    COPD (chronic obstructive pulmonary disease) ([place])    Family history of lung cancer 10/29/2020   Hypertension    Pneumonia 04/17/2018    ALLERGIES:  has no allergies on file.  MEDICATIONS:  Current Outpatient Medications  Medication Sig Dispense Refill   acetaminophen (TYLENOL) 500 MG tablet Take 2 tablets (1,000 mg  total) by mouth every 8 (eight) hours. 30 tablet 0   budesonide (PULMICORT) 0.5 MG/2ML nebulizer solution Take by nebulization.     carvedilol (COREG) 12.5 MG tablet Take 12.5 mg by mouth 2 (two) times daily with a meal.     Cholecalciferol (VITAMIN D3 PO) Take 1 capsule by mouth daily.     Cyanocobalamin (VITAMIN B-12 PO) Take 1 tablet by mouth daily.     folic acid (FOLVITE) 1 MG tablet Take 1 mg by mouth See admin instructions. Take 1 mg daily by mouth starting 7 days before and continuing for 3 weeks after last chemo     guaiFENesin (ROBITUSSIN) 100 MG/5ML SOLN Take 15 mLs by mouth every 4 (four) hours as needed for cough or to loosen phlegm. (Patient not taking: Reported on 10/22/2020)     hydrochlorothiazide (HYDRODIURIL) 25 MG tablet Take 25 mg by mouth daily.     ibuprofen (ADVIL) 800 MG tablet Take 1 tablet (800 mg total) by mouth every 6 (six) hours as needed for mild pain. 30 tablet 0   ipratropium-albuterol (DUONEB) 0.5-2.5 (3) MG/3ML SOLN Take 3 mLs by nebulization every 6 (six) hours as needed. 360 mL 1   irbesartan (AVAPRO) 75 MG tablet Take 75 mg by mouth daily.     lidocaine (LIDODERM) 5 % Place 1 patch onto the skin daily. Remove & Discard patch within 12 hours or as directed by MD (Patient not taking: Reported on 10/22/2020) 30 patch 0   metFORMIN (GLUCOPHAGE-XR) 500 MG 24 hr tablet Take 500 mg by mouth  daily with breakfast.     Multiple Vitamins-Minerals (ONE-A-DAY WOMENS PO) Take 1 tablet by mouth daily with breakfast.     oxyCODONE (OXY IR/ROXICODONE) 5 MG immediate release tablet Take 1 tablet (5 mg total) by mouth every 8 (eight) hours as needed for moderate pain. (Patient not taking: Reported on 10/22/2020) 20 tablet 0   OXYGEN Inhale 3 L/min into the lungs continuous.     prochlorperazine (COMPAZINE) 10 MG tablet Take 10 mg by mouth every 6 (six) hours as needed for nausea or vomiting. (Patient not taking: Reported on 10/22/2020)     Tiotropium Bromide-Olodaterol (STIOLTO RESPIMAT)  2.5-2.5 MCG/ACT AERS Inhale 2 puffs into the lungs daily. 1 Inhaler 0   No current facility-administered medications for this visit.    SURGICAL HISTORY:  Past Surgical History:  Procedure Laterality Date   CESAREAN SECTION  1979; 1981; 1992   TUBAL LIGATION  1992    REVIEW OF SYSTEMS:  A comprehensive review of systems was negative except for: Constitutional: positive for fatigue Respiratory: positive for dyspnea on exertion Musculoskeletal: positive for arthralgias and muscle weakness   PHYSICAL EXAMINATION: General appearance: alert, cooperative, fatigued, and no distress Head: Normocephalic, without obvious abnormality, atraumatic Neck: no adenopathy, no JVD, supple, symmetrical, trachea midline, and thyroid not enlarged, symmetric, no tenderness/mass/nodules Lymph nodes: Cervical, supraclavicular, and axillary nodes normal. Resp: clear to auscultation bilaterally Back: symmetric, no curvature. ROM normal. No CVA tenderness. Cardio: regular rate and rhythm, S1, S2 normal, no murmur, click, rub or gallop GI: soft, non-tender; bowel sounds normal; no masses,  no organomegaly Extremities: extremities normal, atraumatic, no cyanosis or edema  ECOG PERFORMANCE STATUS: 1 - Symptomatic but completely ambulatory  Blood pressure (!) 128/46, pulse 87, temperature (!) 97.2 F (36.2 C), temperature source Tympanic, resp. rate 20, height '4\' 10"'  (1.473 m), weight 261 lb 14.4 oz (118.8 kg), SpO2 95 %.  LABORATORY DATA: Lab Results  Component Value Date   WBC 6.3 11/02/2020   HGB 13.3 11/02/2020   HCT 42.0 11/02/2020   MCV 92.3 11/02/2020   PLT 746 (H) 11/02/2020      Chemistry      Component Value Date/Time   NA 145 10/28/2020 1359   K 4.7 10/28/2020 1359   CL 103 10/28/2020 1359   CO2 34 (H) 10/28/2020 1359   BUN 12 10/28/2020 1359   CREATININE 0.92 10/28/2020 1359      Component Value Date/Time   CALCIUM 9.7 10/28/2020 1359   ALKPHOS 98 10/28/2020 1359   AST 49 (H)  10/28/2020 1359   ALT 97 (H) 10/28/2020 1359   BILITOT <0.2 (L) 10/28/2020 1359       RADIOGRAPHIC STUDIES: CT Head Wo Contrast  Result Date: 10/18/2020 CLINICAL DATA:  Mental status changes. EXAM: CT HEAD WITHOUT CONTRAST TECHNIQUE: Contiguous axial images were obtained from the base of the skull through the vertex without intravenous contrast. COMPARISON:  None. FINDINGS: Brain: There is no evidence for acute hemorrhage, hydrocephalus, mass lesion, or abnormal extra-axial fluid collection. No definite CT evidence for acute infarction. Intravascular contrast evident secondary to recent CTA Chest, limiting assessment for subtle subarachnoid hemorrhage. Patchy low attenuation in the deep hemispheric and periventricular white matter is nonspecific, but likely reflects chronic microvascular ischemic demyelination. Vascular: Persistent enhancement of blood pool from recent CTA Chest . Skull: No evidence for fracture. No worrisome lytic or sclerotic lesion. Sinuses/Orbits: The visualized paranasal sinuses and mastoid air cells are clear. Visualized portions of the globes and intraorbital fat are unremarkable. Other:  None. IMPRESSION: 1. No acute intracranial abnormality. 2. Atrophy with chronic small vessel white matter ischemic disease. Electronically Signed   By: Misty Stanley M.D.   On: 10/18/2020 13:27   CT Angio Chest PE W/Cm &/Or Wo Cm  Result Date: 10/18/2020 CLINICAL DATA:  60 year old female with shortness of breath and elevated D-dimer. EXAM: CT ANGIOGRAPHY CHEST WITH CONTRAST TECHNIQUE: Multidetector CT imaging of the chest was performed using the standard protocol during bolus administration of intravenous contrast. Multiplanar CT image reconstructions and MIPs were obtained to evaluate the vascular anatomy. CONTRAST:  129m OMNIPAQUE IOHEXOL 350 MG/ML SOLN COMPARISON:  04/15/2018 CT.  10/18/2020 prior radiographs FINDINGS: Cardiovascular: Satisfactory opacification of the pulmonary arteries to  the segmental level. No evidence of pulmonary embolism. Cardiomegaly is noted. No thoracic aortic aneurysm or pericardial effusion identified. Mediastinum/Nodes: Enlarged LEFT hilar nodes are identified with a 1.7 cm index node (series 6: Image 47). Other UPPER limits of normal sized mediastinal lymph nodes are identified. The thyroid gland, trachea and esophagus are unremarkable. Lungs/Pleura: A 3 x 3.6 x 3.9 cm mass versus masslike consolidation is noted within the posteromedial aspect of the LEFT LOWER lobe (8:50). Multiple primarily subcentimeter bilateral pulmonary nodules are identified, many which are new from 04/15/2018. Index nodules include a 1.4 x 1.4 x 2 cm LEFT apical nodule (8:15), a 8 mm LEFT UPPER lobe nodule (8:23) and 6 mm LEFT LOWER lobe nodule (8:62). Scattered areas of atelectasis within both lungs are identified. No pleural effusion or pneumothorax identified. Upper Abdomen: A nonspecific 1.8 cm hypodense splenic lesion (6:80) is noted. Probable hepatic steatosis identified. Musculoskeletal: New 3 mm lytic lesions within the LEFT 5th rib and LEFT 8th rib are identified) 8:35 and 8:60. A 6 mm area of sclerosis within the anterior RIGHT 5th rib is noted. No other new bony abnormalities are identified. Review of the MIP images confirms the above findings. IMPRESSION: 1. 3 x 3.6 x 3.9 cm mass versus masslike consolidation within the posteromedial LEFT LOWER lobe - suspicious for malignancy. Multiple bilateral pulmonary nodules and enlarged LEFT hilar lymph nodes worrisome for metastatic disease. 2. New 3 mm lytic lesions within the LEFT 5th rib and LEFT 8th rib - suspicious for metastatic disease. Indeterminate 6 mm sclerosis within the RIGHT 5th rib. 3. Indeterminate 1.8 cm splenic lesion. Consider elective MRI for further evaluation. 4. No evidence of pulmonary emboli or thoracic aortic aneurysm. 5. Cardiomegaly. 6. Probable hepatic steatosis. Electronically Signed   By: JMargarette CanadaM.D.   On:  10/18/2020 11:18   DG Chest Portable 1 View  Result Date: 10/18/2020 CLINICAL DATA:  Shortness of breath. EXAM: PORTABLE CHEST 1 VIEW COMPARISON:  None. FINDINGS: Mild cardiomegaly is noted. Mild left midlung subsegmental atelectasis is noted. No pneumothorax or significant pleural effusion is noted. Right lung is clear. Bony thorax is unremarkable. IMPRESSION: Mild left midlung subsegmental atelectasis. Electronically Signed   By: JMarijo ConceptionM.D.   On: 10/18/2020 08:13   ECHOCARDIOGRAM COMPLETE  Result Date: 10/19/2020    ECHOCARDIOGRAM REPORT   Patient Name:   Tiffany CHAVIANODate of Exam: 10/19/2020 Medical Rec #:  [id number] Height:       58.0 in Accession #:    [id number]:       271.2 lb Date of Birth:  [birth year] BSA:          2.073 m Patient Age:    68years   BP:  138/78 mmHg Patient Gender: F          HR:           95 bpm. Exam Location:  Inpatient Procedure: 2D Echo, Cardiac Doppler and Color Doppler Indications:     Congestive heart failure  History:         Patient has prior history of Echocardiogram examinations, most                  recent 04/16/2018. COPD; Risk Factors:Hypertension,                  Dyslipidemia, Former Smoker and Sleep Apnea.  Sonographer:     Clayton Lefort RDCS (AE) Referring Phys:  [id number] [place] Diagnosing Phys: Charolette Forward MD IMPRESSIONS  1. Left ventricular ejection fraction, by estimation, is 55 to 60%. The left ventricle has normal function. The left ventricle has no regional wall motion abnormalities. Left ventricular diastolic parameters were normal.  2. Right ventricular systolic function is normal. The right ventricular size is normal. There is mildly elevated pulmonary artery systolic pressure.  3. There is no evidence of cardiac tamponade.  4. The mitral valve is normal in structure. Trivial mitral valve regurgitation.  5. The aortic valve is normal in structure. Aortic valve regurgitation is not visualized. No aortic stenosis is  present.  6. The inferior vena cava is normal in size with greater than 50% respiratory variability, suggesting right atrial pressure of 3 mmHg. FINDINGS  Left Ventricle: Left ventricular ejection fraction, by estimation, is 55 to 60%. The left ventricle has normal function. The left ventricle has no regional wall motion abnormalities. The left ventricular internal cavity size was normal in size. There is  no left ventricular hypertrophy. Left ventricular diastolic parameters were normal. Right Ventricle: The right ventricular size is normal. No increase in right ventricular wall thickness. Right ventricular systolic function is normal. There is mildly elevated pulmonary artery systolic pressure. The tricuspid regurgitant velocity is 2.99  m/s, and with an assumed right atrial pressure of 3 mmHg, the estimated right ventricular systolic pressure is 45.4 mmHg. Left Atrium: Left atrial size was normal in size. Right Atrium: Right atrial size was normal in size. Pericardium: Trivial pericardial effusion is present. There is no evidence of cardiac tamponade. Mitral Valve: The mitral valve is normal in structure. Trivial mitral valve regurgitation. MV peak gradient, 8.0 mmHg. The mean mitral valve gradient is 3.0 mmHg. Tricuspid Valve: The tricuspid valve is normal in structure. Tricuspid valve regurgitation is trivial. Aortic Valve: The aortic valve is normal in structure. Aortic valve regurgitation is not visualized. No aortic stenosis is present. Aortic valve mean gradient measures 7.0 mmHg. Aortic valve peak gradient measures 13.0 mmHg. Aortic valve area, by VTI measures 2.10 cm. Pulmonic Valve: The pulmonic valve was normal in structure. Pulmonic valve regurgitation is not visualized. Aorta: The aortic root is normal in size and structure. Venous: The inferior vena cava is normal in size with greater than 50% respiratory variability, suggesting right atrial pressure of 3 mmHg. IAS/Shunts: No atrial level shunt  detected by color flow Doppler.  LEFT VENTRICLE PLAX 2D LVIDd:         3.70 cm  Diastology LVIDs:         2.30 cm  LV e' medial:    8.81 cm/s LV PW:         1.40 cm  LV E/e' medial:  13.2 LV IVS:        1.00 cm  LV e' lateral:   9.36 cm/s LVOT diam:     1.90 cm  LV E/e' lateral: 12.4 LV SV:         65 LV SV Index:   32 LVOT Area:     2.84 cm  RIGHT VENTRICLE             IVC RV Basal diam:  3.30 cm     IVC diam: 1.80 cm RV S prime:     14.60 cm/s TAPSE (M-mode): 2.9 cm LEFT ATRIUM             Index       RIGHT ATRIUM           Index LA diam:        4.00 cm 1.93 cm/m  RA Area:     16.40 cm LA Vol (A2C):   76.0 ml 36.67 ml/m RA Volume:   47.60 ml  22.97 ml/m LA Vol (A4C):   68.8 ml 33.19 ml/m LA Biplane Vol: 74.7 ml 36.04 ml/m  AORTIC VALVE AV Area (Vmax):    2.17 cm AV Area (Vmean):   2.08 cm AV Area (VTI):     2.10 cm AV Vmax:           180.00 cm/s AV Vmean:          127.000 cm/s AV VTI:            0.312 m AV Peak Grad:      13.0 mmHg AV Mean Grad:      7.0 mmHg LVOT Vmax:         138.00 cm/s LVOT Vmean:        93.200 cm/s LVOT VTI:          0.231 m LVOT/AV VTI ratio: 0.74  AORTA Ao Root diam: 3.20 cm Ao Asc diam:  3.60 cm MITRAL VALVE                TRICUSPID VALVE MV Area (PHT): 2.64 cm     TR Peak grad:   35.8 mmHg MV Area VTI:   1.96 cm     TR Vmax:        299.00 cm/s MV Peak grad:  8.0 mmHg MV Mean grad:  3.0 mmHg     SHUNTS MV Vmax:       1.41 m/s     Systemic VTI:  0.23 m MV Vmean:      86.6 cm/s    Systemic Diam: 1.90 cm MV Decel Time: 287 msec MV E velocity: 116.00 cm/s MV A velocity: 128.00 cm/s MV E/A ratio:  0.91 Charolette Forward MD Electronically signed by Charolette Forward MD Signature Date/Time: 10/19/2020/10:36:35 AM    Final     ASSESSMENT AND PLAN: This is a very pleasant 60 years old African-American female diagnosed with Stage IV (T2a, N2, M1b) non-small cell lung cancer, adenocarcinoma diagnosed in May 2022 and presented with left lower lobe lung mass in addition to left hilar and  mediastinal lymphadenopathy as well as bilateral pulmonary nodules and metastatic disease to the ribs bilaterally. 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 The patient is currently undergoing systemic chemotherapy with carboplatin for AUC of 5, Alimta 500 Mg/M2 and Keytruda 200 Mg IV every 3 weeks status post 1 cycle.  First cycle of her treatment was started at [facility] in [place]. She tolerated the first cycle of her treatment well  with no concerning complaints except for fatigue. I recommended for her to proceed with cycle #2 today as planned. I also strongly recommend for the patient to have the MRI of the brain performed and I will give her Ativan 0.5 mg to be used 30 minutes before the procedure and repeated once if needed. She will come back for follow-up visit in 3 weeks for evaluation before the next cycle of her treatment. She was advised to call immediately if she has any concerning symptoms in the interval. The patient voices understanding of current disease status and treatment options and is in agreement with the current care plan.  All questions were answered. The patient knows to call the clinic with any problems, questions or concerns. We can certainly see the patient much sooner if necessary.   Disclaimer: This note was dictated with voice recognition software. Similar sounding words can inadvertently be transcribed and may not be corrected upon review.

## 2020-11-02 NOTE — Patient Instructions (Signed)
[facility]  Discharge Instructions: Thank you for choosing [facility] to provide your oncology and hematology care.   If you have a lab appointment with the [facility], please go directly to the [facility] and check in at the registration area.   Wear comfortable clothing and clothing appropriate for easy access to any Portacath or PICC line.   We strive to give you quality time with your provider. You may need to reschedule your appointment if you arrive late (15 or more minutes).  Arriving late affects you and other patients whose appointments are after yours.  Also, if you miss three or more appointments without notifying the office, you may be dismissed from the clinic at the provider's discretion.      For prescription refill requests, have your pharmacy contact our office and allow 72 hours for refills to be completed.    Today you received the following chemotherapy and/or immunotherapy agents Keytruda; Alimta; Carboplatin      To help prevent nausea and vomiting after your treatment, we encourage you to take your nausea medication as directed.  BELOW ARE SYMPTOMS THAT SHOULD BE REPORTED IMMEDIATELY: *FEVER GREATER THAN 100.4 F (38 C) OR HIGHER *CHILLS OR SWEATING *NAUSEA AND VOMITING THAT IS NOT CONTROLLED WITH YOUR NAUSEA MEDICATION *UNUSUAL SHORTNESS OF BREATH *UNUSUAL BRUISING OR BLEEDING *URINARY PROBLEMS (pain or burning when urinating, or frequent urination) *BOWEL PROBLEMS (unusual diarrhea, constipation, pain near the anus) TENDERNESS IN MOUTH AND THROAT WITH OR WITHOUT PRESENCE OF ULCERS (sore throat, sores in mouth, or a toothache) UNUSUAL RASH, SWELLING OR PAIN  UNUSUAL VAGINAL DISCHARGE OR ITCHING   Items with * indicate a potential emergency and should be followed up as soon as possible or go to the Emergency Department if any problems should occur.  Please show the CHEMOTHERAPY ALERT CARD or IMMUNOTHERAPY ALERT  CARD at check-in to the Emergency Department and triage nurse.  Should you have questions after your visit or need to cancel or reschedule your appointment, please contact [facility]  Dept: [phone]  and follow the prompts.  Office hours are 8:00 a.m. to 4:30 p.m. Monday - Friday. Please note that voicemails left after 4:00 p.m. may not be returned until the following business day.  We are closed weekends and major holidays. You have access to a nurse at all times for urgent questions. Please call the main number to the clinic Dept: [phone] and follow the prompts.   For any non-urgent questions, you may also contact your provider using MyChart. We now offer e-Visits for anyone 91 and older to request care online for non-urgent symptoms. For details visit [email or web address].   Also download the MyChart app! Go to the app store, search "MyChart", open the app, select [facility], and log in with your MyChart username and password.  Due to Covid, a mask is required upon entering the hospital/clinic. If you do not have a mask, one will be given to you upon arrival. For doctor visits, patients may have 1 support person aged 35 or older with them. For treatment visits, patients cannot have anyone with them due to current Covid guidelines and our immunocompromised population.

## 2020-11-03 ENCOUNTER — Telehealth: Payer: Self-pay | Admitting: *Deleted

## 2020-11-03 NOTE — Telephone Encounter (Signed)
Called pt to see how she did with her treatment yest & spoke to daughter.  She reports pt doing well without c/o's.  She did have concern about MRI, stating that she was unable to do b/c of claustrophobia.  She states Dr Julien Nordmann was going to order something to help with that.  Noted that lorazepam was sent in.  Gave daughter [employer] cabin crew # to call to r/s.  Message to Desk RN in case new order to be placed.

## 2020-11-04 ENCOUNTER — Telehealth: Payer: Self-pay | Admitting: *Deleted

## 2020-11-04 NOTE — Telephone Encounter (Signed)
I called to check on Tiffany Owen today.  I was unable to reach her but did leave vm message with my name and phone number to call.

## 2020-11-05 IMAGING — DX DG CHEST 2V
2 series · 2 of 2 positions shown · non-contrast
Comparison: None.

CLINICAL DATA: Cough for 1 week.  Frequent urination.

EXAM:
CHEST - 2 VIEW

[x chest ap]
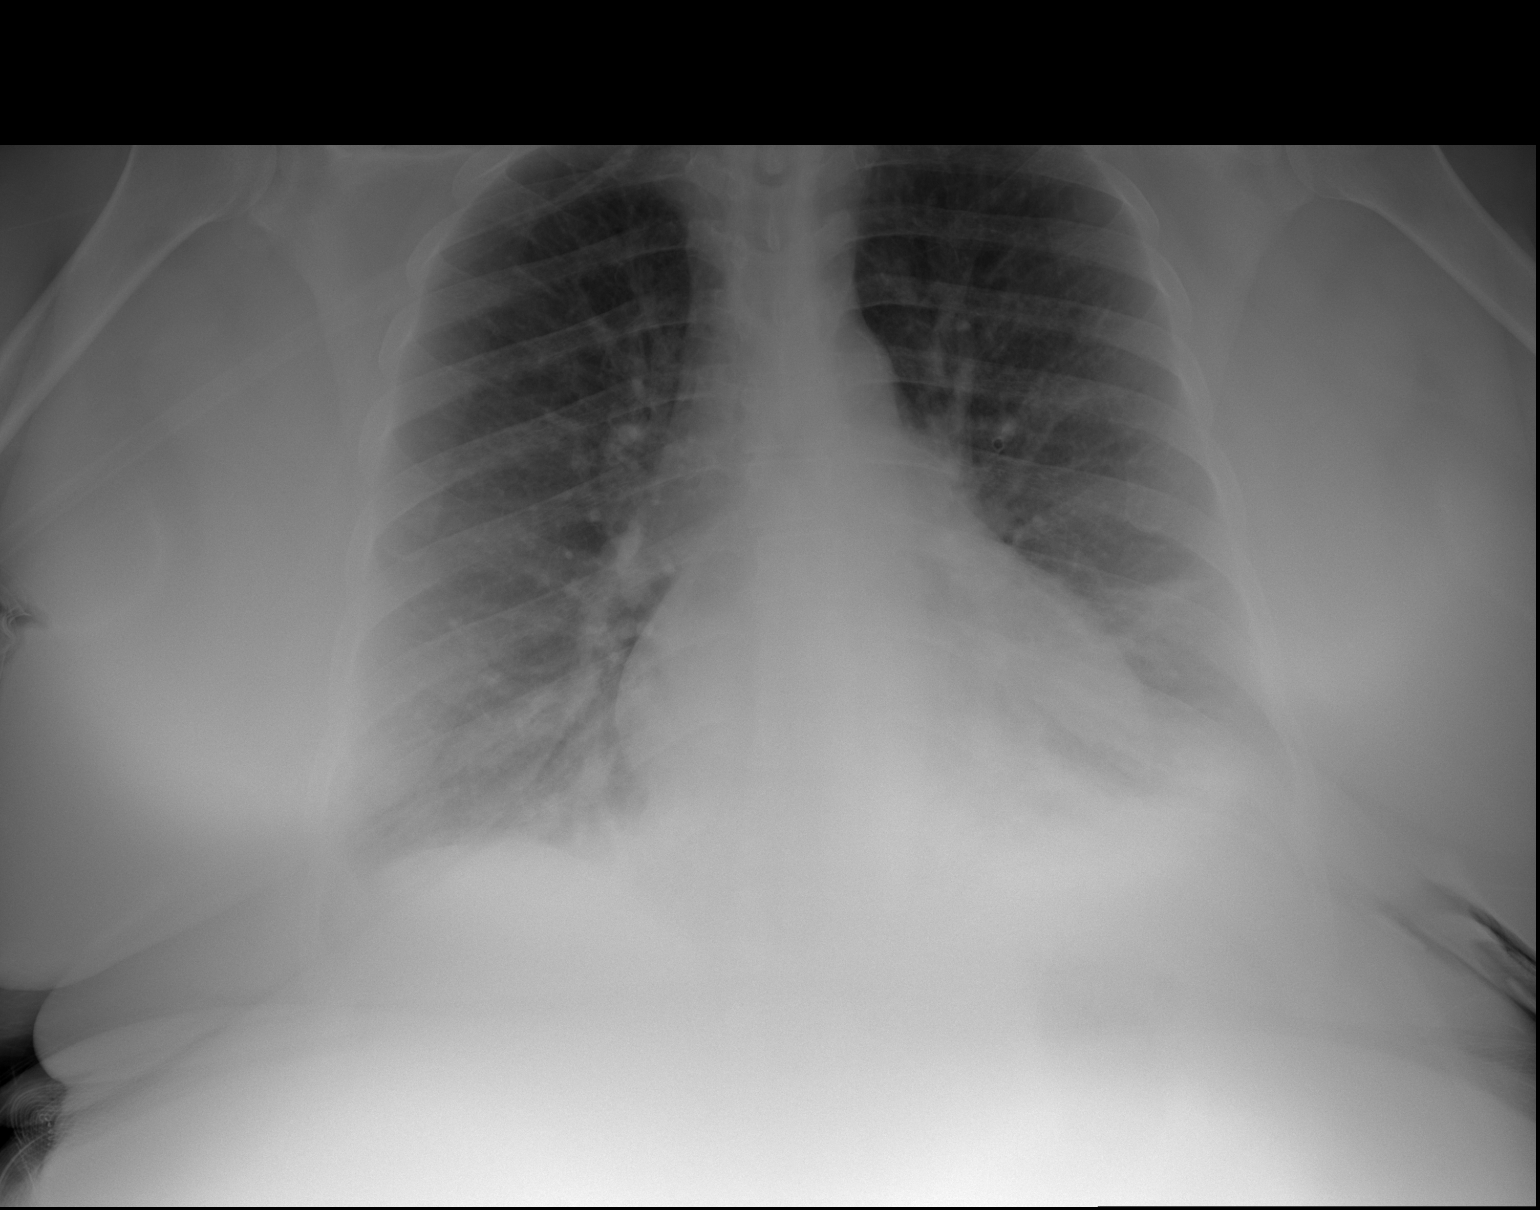

[w chest lat]
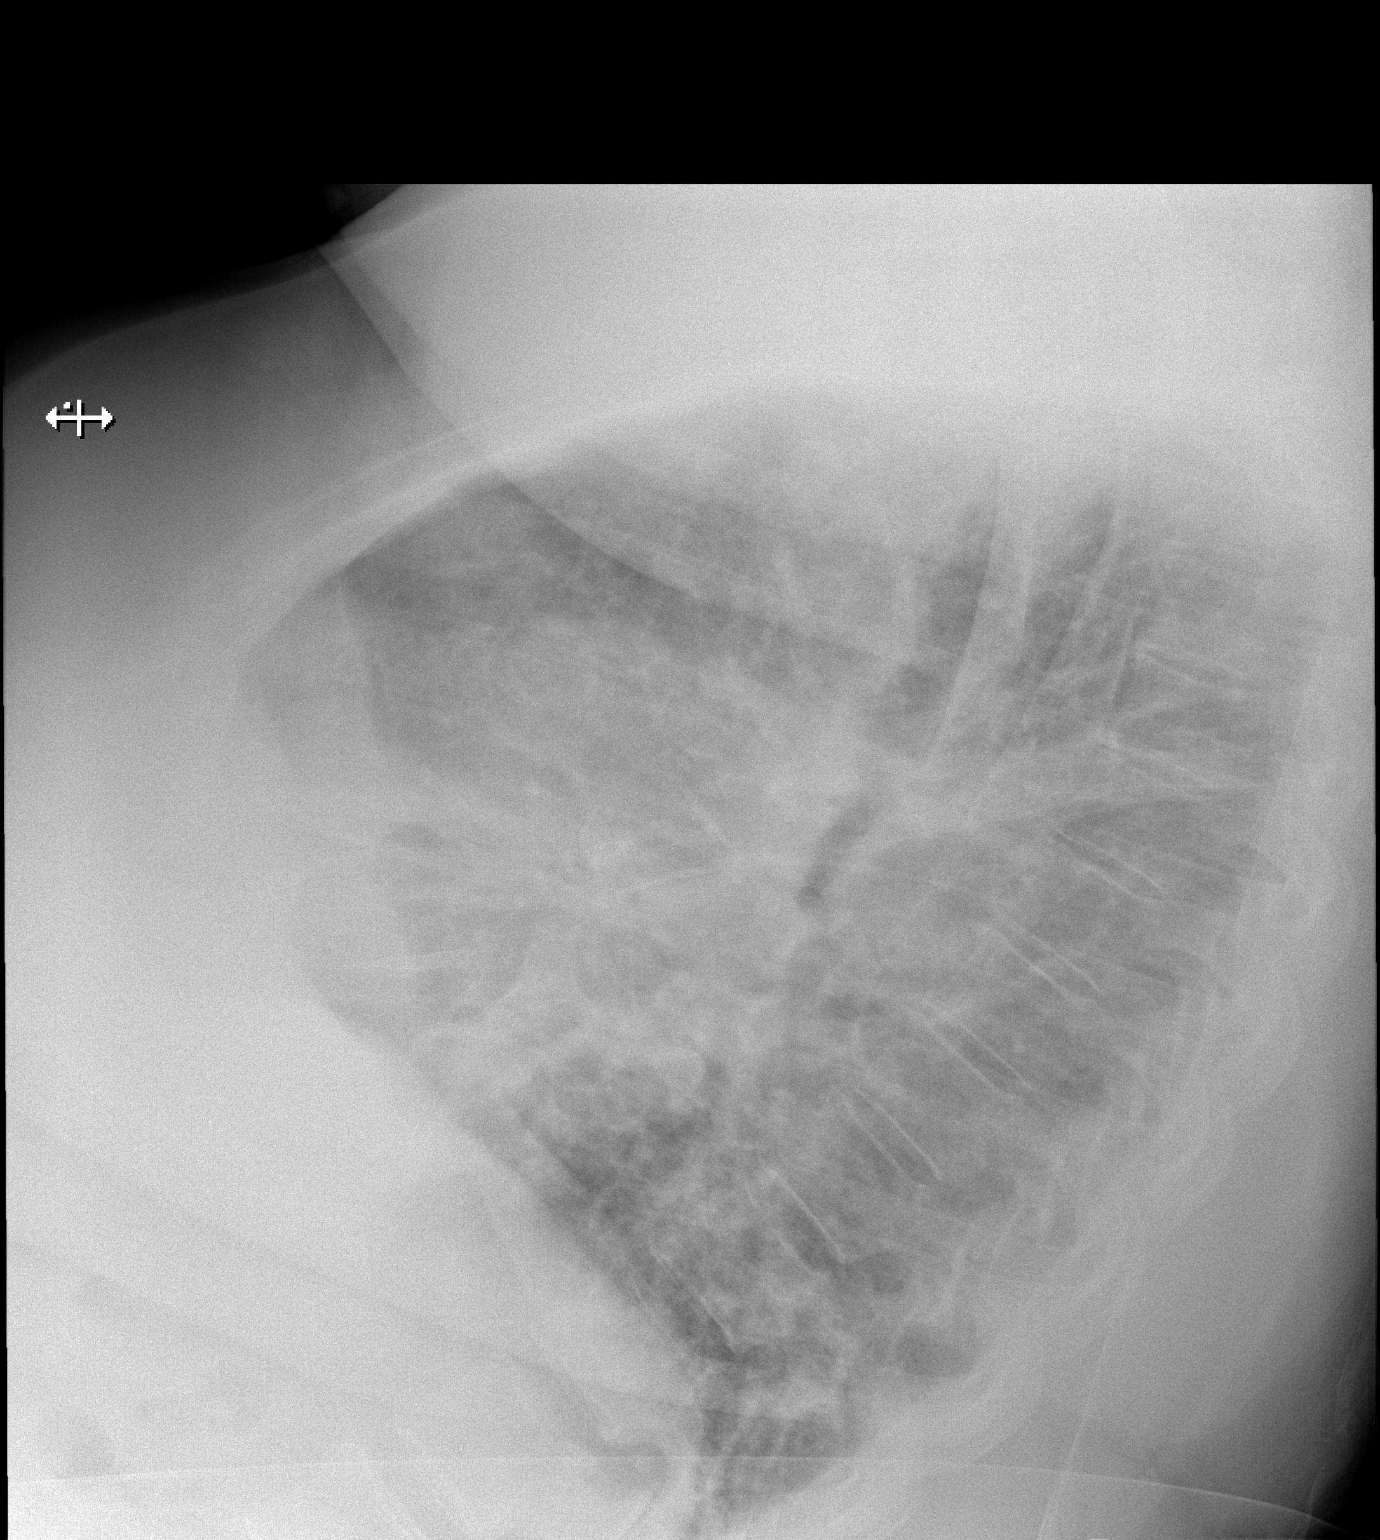

[2 of 2 positions shown; findings below may reference images not displayed]

FINDINGS: Vague densities in the left lower chest which could be related to
overlying soft tissues. Upper lungs are clear. Heart size is within
normal limits. No large pleural effusions. Bone structures are
unremarkable.
IMPRESSION: Subtle densities in the left lower chest are nonspecific. Findings
could be related overlying soft tissues versus atelectasis. Limited
evaluation of the lungs due to body habitus.

## 2020-11-05 IMAGING — CT CT ANGIO CHEST
2 of 6 series · 19 of 36 positions shown · IV contrast (iopamidol)
Comparison: No priors.

CLINICAL DATA: 57-year-old female with history of cough and
shortness of breath for the past 2 weeks. No associated chest pain.

EXAM:
CT ANGIOGRAPHY CHEST WITH CONTRAST
TECHNIQUE: Multidetector CT imaging of the chest was performed using the
standard protocol during bolus administration of intravenous
contrast. Multiplanar CT image reconstructions and MIPs were
obtained to evaluate the vascular anatomy.
CONTRAST:  <See Chart> J21UOH-HYR IOPAMIDOL (J21UOH-HYR) INJECTION
76%

[Series 7: pe thins · axial · 0.60mm/px · z∈[-307,-86]mm · 18 of 351 slices shown]
[im 18/351  lung]
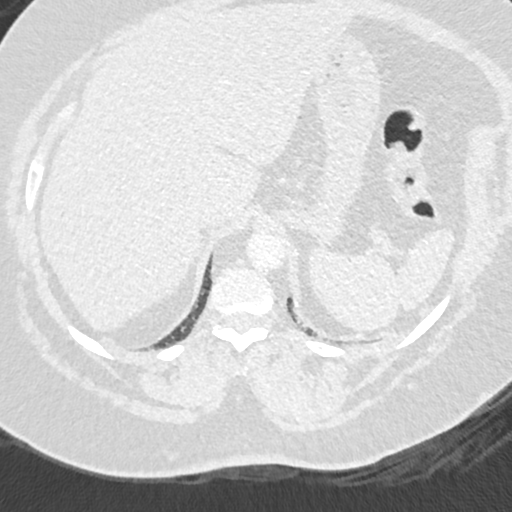
[im 36/351  mediastinal]
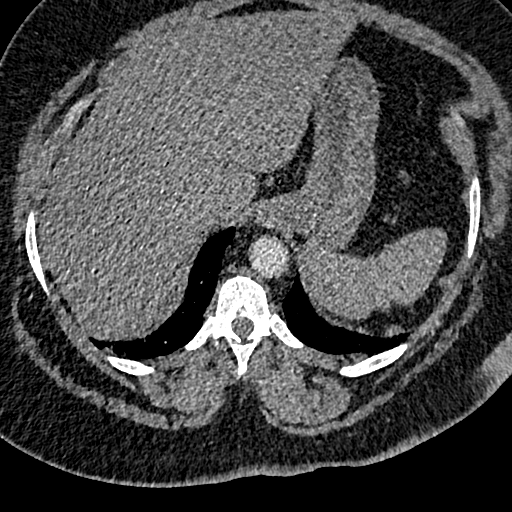
[im 53/351  lung]
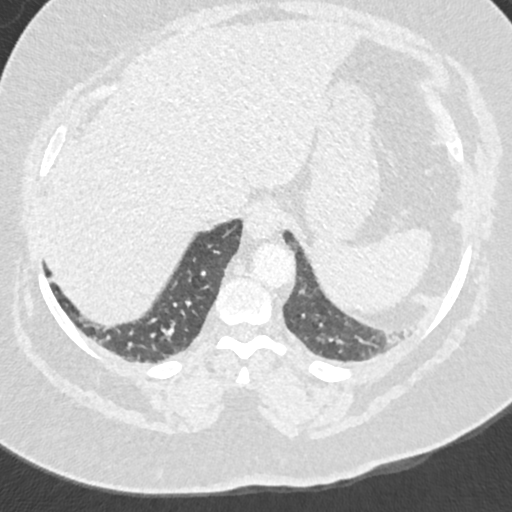
[im 71/351  mediastinal]
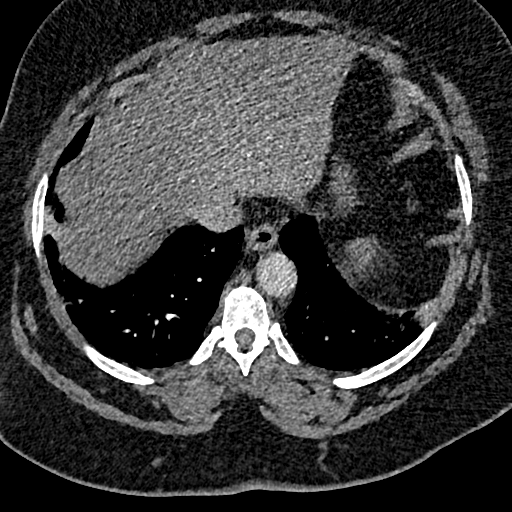
[im 88/351  lung]
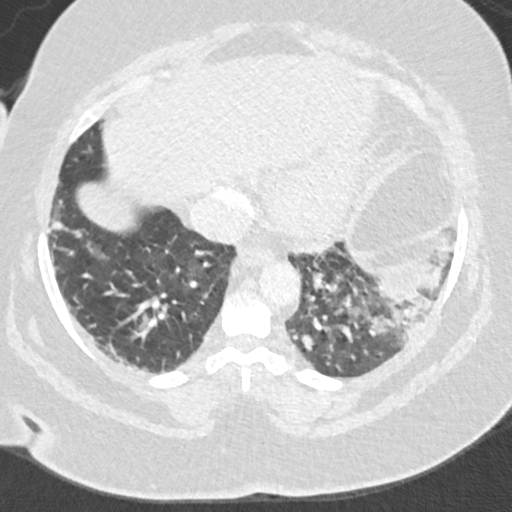
[im 106/351  mediastinal]
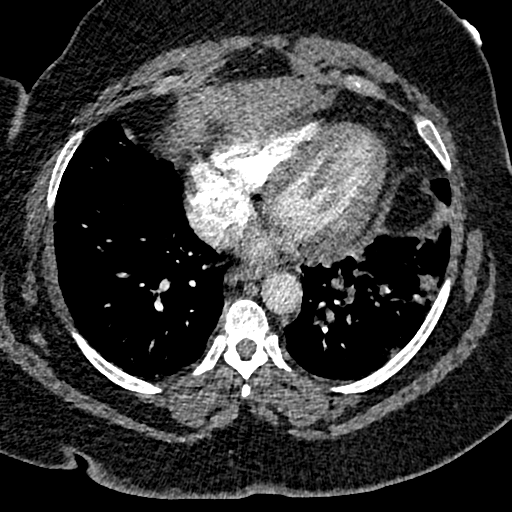
[im 123/351  lung]
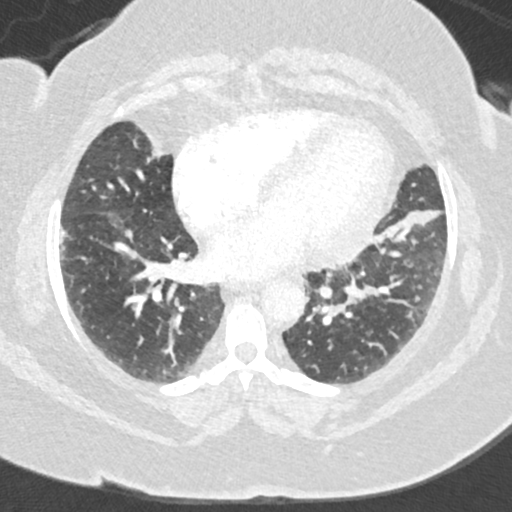
[im 141/351  mediastinal]
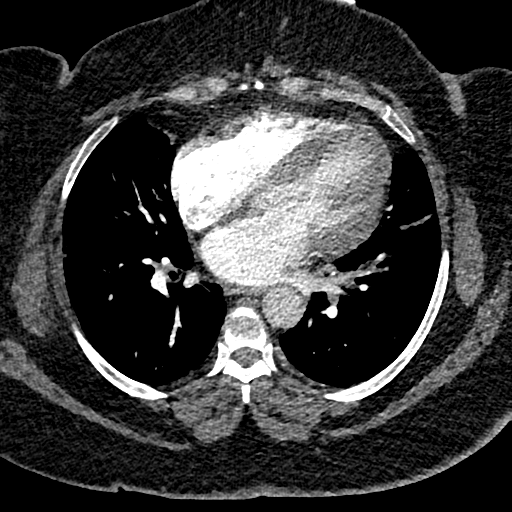
[im 158/351  lung]
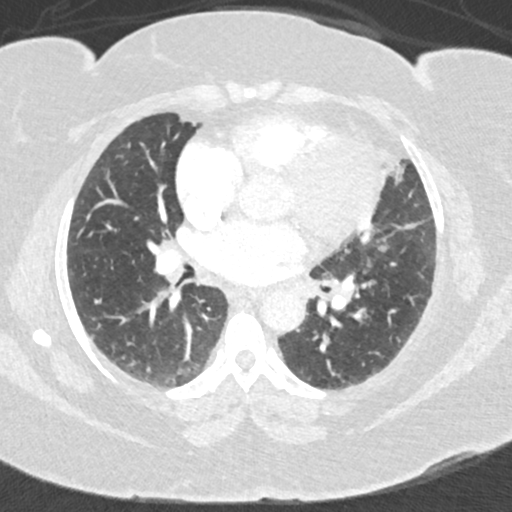
[im 193/351  mediastinal]
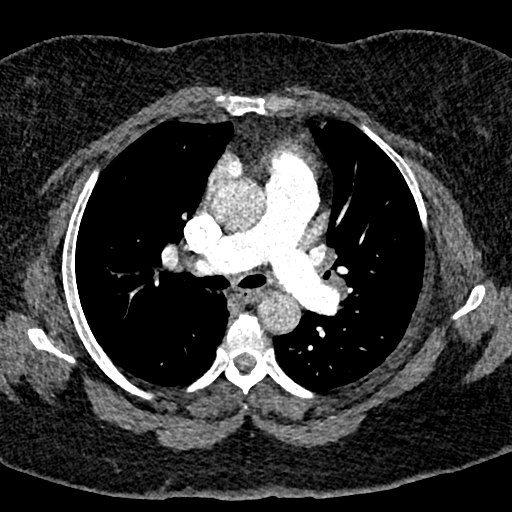
[im 211/351  lung]
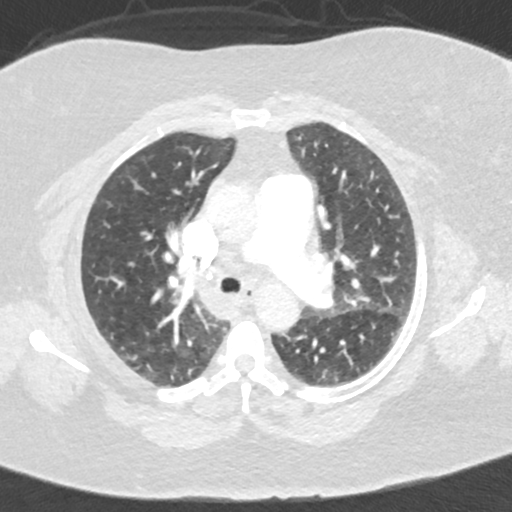
[im 228/351  mediastinal]
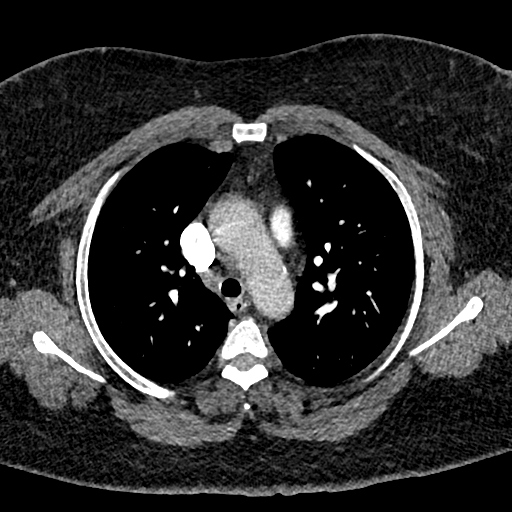
[im 246/351  lung]
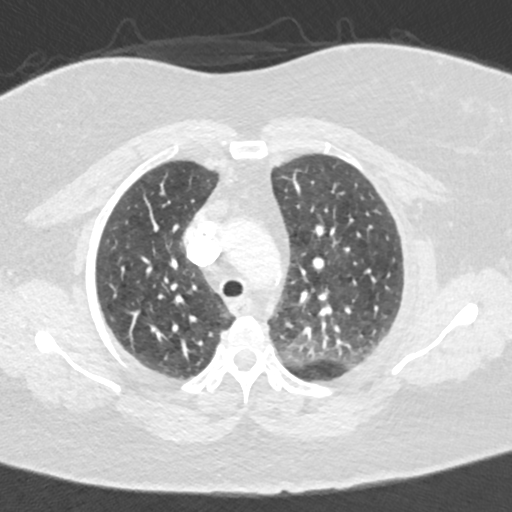
[im 263/351  mediastinal]
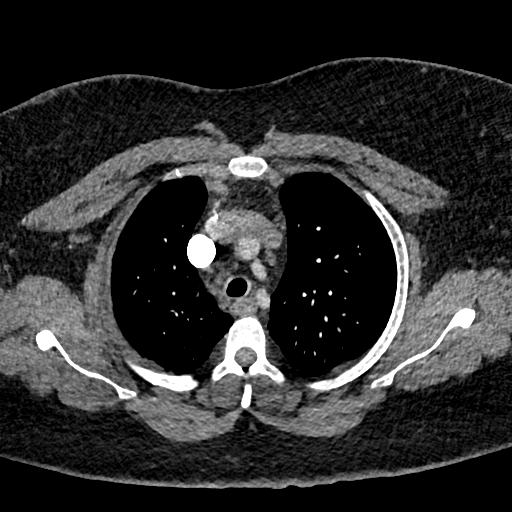
[im 281/351  lung]
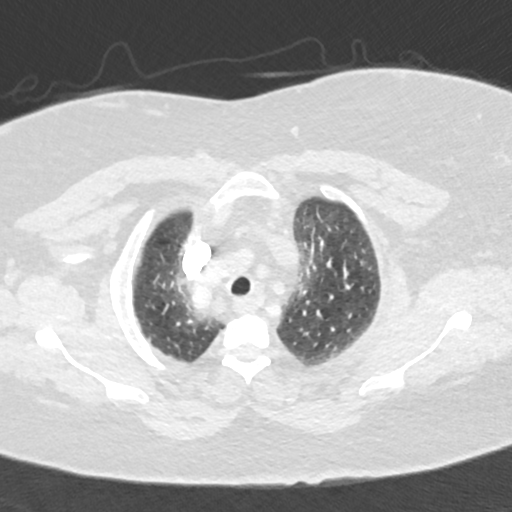
[im 298/351  mediastinal]
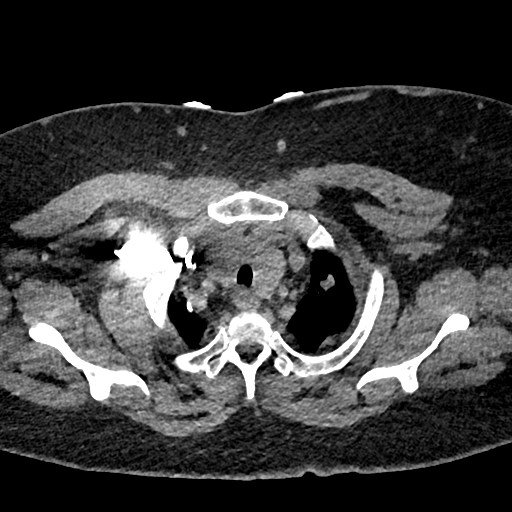
[im 316/351  lung]
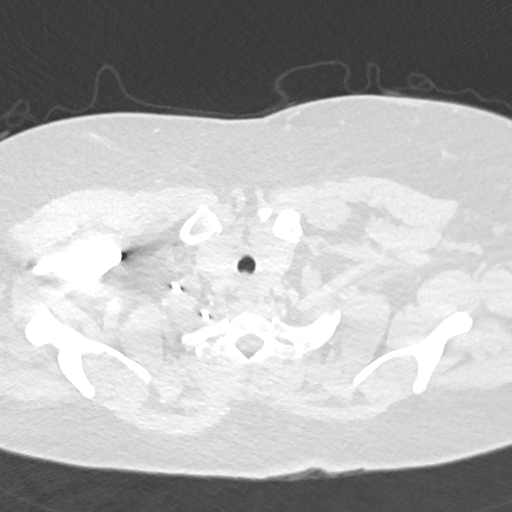
[im 333/351  mediastinal]
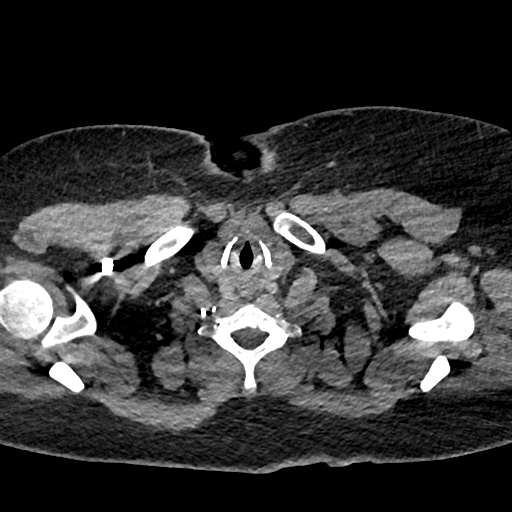

[Series 10: pe 2mm cor · coronal · 0.59mm/px · 1 of 151 slices shown]
[im 76/151  mediastinal]
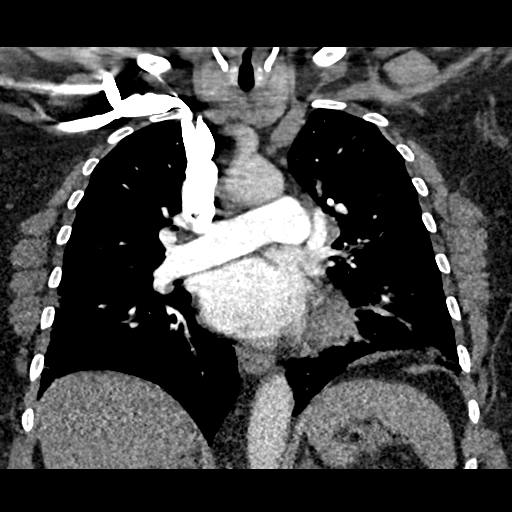

[19 of 36 positions shown; findings below may reference images not displayed]

FINDINGS: Cardiovascular: No filling defects within the pulmonary arterial
tree to suggest underlying pulmonary embolism. Heart size is mildly
enlarged. There is no significant pericardial fluid, thickening or
pericardial calcification. Aortic atherosclerosis. No definite
coronary artery calcifications.

Mediastinum/Nodes: No pathologically enlarged mediastinal or hilar
lymph nodes. Esophagus is unremarkable in appearance. No axillary
lymphadenopathy.

Lungs/Pleura: Patchy multifocal interstitial and airspace disease in
the lungs bilaterally, most confluent in the left lower lobe where
there is extensive peribronchovascular ground-glass attenuation and
areas of consolidation, most compatible with multilobar
bronchopneumonia. Area of scarring in the inferior segment of the
lingula. No pleural effusions.

Upper Abdomen: Diffuse low attenuation throughout the visualized
hepatic parenchyma, indicative of severe hepatic steatosis.

Musculoskeletal: There are no aggressive appearing lytic or blastic
lesions noted in the visualized portions of the skeleton.

Review of the MIP images confirms the above findings.
IMPRESSION: 1. No evidence of pulmonary embolism.
2. Multilobar bronchopneumonia, most severe in the left lower lobe.
3. Mild cardiomegaly.
4. Hepatic steatosis.
5. Aortic atherosclerosis.

Aortic Atherosclerosis (2UCYB-BAO.O).

## 2020-11-09 ENCOUNTER — Inpatient Hospital Stay: Payer: BC Managed Care – PPO

## 2020-11-09 ENCOUNTER — Other Ambulatory Visit: Payer: Self-pay

## 2020-11-09 ENCOUNTER — Telehealth: Payer: Self-pay

## 2020-11-09 DIAGNOSIS — C3492 Malignant neoplasm of unspecified part of left bronchus or lung: Secondary | ICD-10-CM | POA: Diagnosis not present

## 2020-11-09 DIAGNOSIS — C7951 Secondary malignant neoplasm of bone: Secondary | ICD-10-CM | POA: Diagnosis not present

## 2020-11-09 DIAGNOSIS — I509 Heart failure, unspecified: Secondary | ICD-10-CM | POA: Diagnosis not present

## 2020-11-09 DIAGNOSIS — I110 Hypertensive heart disease with heart failure: Secondary | ICD-10-CM | POA: Diagnosis not present

## 2020-11-09 DIAGNOSIS — J449 Chronic obstructive pulmonary disease, unspecified: Secondary | ICD-10-CM | POA: Diagnosis not present

## 2020-11-09 DIAGNOSIS — Z5111 Encounter for antineoplastic chemotherapy: Secondary | ICD-10-CM | POA: Diagnosis not present

## 2020-11-09 DIAGNOSIS — Z5112 Encounter for antineoplastic immunotherapy: Secondary | ICD-10-CM | POA: Diagnosis not present

## 2020-11-09 DIAGNOSIS — Z87891 Personal history of nicotine dependence: Secondary | ICD-10-CM | POA: Diagnosis not present

## 2020-11-09 LAB — CBC WITH DIFFERENTIAL (CANCER CENTER ONLY)
Abs Immature Granulocytes: 0.02 10*3/uL (ref 0.00–0.07)
Basophils Absolute: 0 10*3/uL (ref 0.0–0.1)
Basophils Relative: 1 %
Eosinophils Absolute: 0.1 10*3/uL (ref 0.0–0.5)
Eosinophils Relative: 3 %
HCT: 37.8 % (ref 36.0–46.0)
Hemoglobin: 11.9 g/dL — ABNORMAL LOW (ref 12.0–15.0)
Immature Granulocytes: 1 %
Lymphocytes Relative: 36 %
Lymphs Abs: 1.3 10*3/uL (ref 0.7–4.0)
MCH: 29.2 pg (ref 26.0–34.0)
MCHC: 31.5 g/dL (ref 30.0–36.0)
MCV: 92.9 fL (ref 80.0–100.0)
Monocytes Absolute: 0.4 10*3/uL (ref 0.1–1.0)
Monocytes Relative: 10 %
Neutro Abs: 1.8 10*3/uL (ref 1.7–7.7)
Neutrophils Relative %: 49 %
Platelet Count: 403 10*3/uL — ABNORMAL HIGH (ref 150–400)
RBC: 4.07 MIL/uL (ref 3.87–5.11)
RDW: 13.3 % (ref 11.5–15.5)
WBC Count: 3.6 10*3/uL — ABNORMAL LOW (ref 4.0–10.5)
nRBC: 0 % (ref 0.0–0.2)

## 2020-11-09 LAB — CMP (CANCER CENTER ONLY)
ALT: 68 U/L — ABNORMAL HIGH (ref 0–44)
AST: 47 U/L — ABNORMAL HIGH (ref 15–41)
Albumin: 3.1 g/dL — ABNORMAL LOW (ref 3.5–5.0)
Alkaline Phosphatase: 99 U/L (ref 38–126)
Anion gap: 9 (ref 5–15)
BUN: 14 mg/dL (ref 8–23)
CO2: 33 mmol/L — ABNORMAL HIGH (ref 22–32)
Calcium: 9.8 mg/dL (ref 8.9–10.3)
Chloride: 98 mmol/L (ref 98–111)
Creatinine: 0.79 mg/dL (ref 0.44–1.00)
GFR, Estimated: >60 mL/min (ref >60)
Glucose, Bld: 105 mg/dL — ABNORMAL HIGH (ref 70–99)
Potassium: 4.2 mmol/L (ref 3.5–5.1)
Sodium: 140 mmol/L (ref 135–145)
Total Bilirubin: 0.5 mg/dL (ref 0.3–1.2)
Total Protein: 6.9 g/dL (ref 6.5–8.1)

## 2020-11-09 NOTE — Telephone Encounter (Signed)
Pt daughter stated the pt has not been scheduled for her port placement.  I have entered the order for this and scheduled the pt at [facility] location for Thursday 11/12/20 with an arrival time of 10:30am.   Pt is to be NPO after midnight, must have a driver. Pts daughter has been advised of this and given the location information of "Short Stay, Entrance A" and go to the admitting department. Pt daughter expresesed understanding of this information.

## 2020-11-10 ENCOUNTER — Telehealth: Payer: Self-pay

## 2020-11-10 NOTE — Telephone Encounter (Signed)
Attempted to contact patient's daughter Edwin Dada to schedule a Palliative Care consult appointment. No answer left a message to return call.

## 2020-11-11 ENCOUNTER — Other Ambulatory Visit: Payer: Self-pay | Admitting: Radiology

## 2020-11-12 ENCOUNTER — Other Ambulatory Visit: Payer: Self-pay | Admitting: Radiology

## 2020-11-12 ENCOUNTER — Ambulatory Visit (HOSPITAL_COMMUNITY)
Admission: RE | Admit: 2020-11-12 | Discharge: 2020-11-12 | Disposition: A | Discharge status: Home / Self Care | Payer: BC Managed Care – PPO | Source: Ambulatory Visit | Attending: Internal Medicine | Admitting: Internal Medicine

## 2020-11-12 ENCOUNTER — Other Ambulatory Visit: Payer: Self-pay

## 2020-11-12 DIAGNOSIS — C3490 Malignant neoplasm of unspecified part of unspecified bronchus or lung: Secondary | ICD-10-CM | POA: Insufficient documentation

## 2020-11-12 DIAGNOSIS — I110 Hypertensive heart disease with heart failure: Secondary | ICD-10-CM | POA: Insufficient documentation

## 2020-11-12 DIAGNOSIS — Z87891 Personal history of nicotine dependence: Secondary | ICD-10-CM | POA: Diagnosis not present

## 2020-11-12 DIAGNOSIS — J449 Chronic obstructive pulmonary disease, unspecified: Secondary | ICD-10-CM | POA: Insufficient documentation

## 2020-11-12 DIAGNOSIS — I509 Heart failure, unspecified: Secondary | ICD-10-CM | POA: Insufficient documentation

## 2020-11-12 DIAGNOSIS — C3492 Malignant neoplasm of unspecified part of left bronchus or lung: Secondary | ICD-10-CM

## 2020-11-12 DIAGNOSIS — Z452 Encounter for adjustment and management of vascular access device: Secondary | ICD-10-CM | POA: Diagnosis not present

## 2020-11-12 HISTORY — PX: IR IMAGING GUIDED PORT INSERTION: IMG5740

## 2020-11-12 MED ORDER — SODIUM CHLORIDE 0.9 % IV SOLN: INTRAVENOUS | Status: DC

## 2020-11-12 MED ORDER — CEFAZOLIN SODIUM-DEXTROSE 2-4 GM/100ML-% IV SOLN
INTRAVENOUS | Status: AC
  Filled 2020-11-12: qty 100

## 2020-11-12 MED ORDER — HEPARIN SOD (PORK) LOCK FLUSH 100 UNIT/ML IV SOLN
INTRAVENOUS | Status: AC | PRN
  Administered 2020-11-12: 500 [IU]

## 2020-11-12 MED ORDER — HEPARIN SOD (PORK) LOCK FLUSH 100 UNIT/ML IV SOLN
INTRAVENOUS | Status: AC
  Filled 2020-11-12: qty 5

## 2020-11-12 MED ORDER — LIDOCAINE-EPINEPHRINE (PF) 1 %-1:200000 IJ SOLN
INTRAMUSCULAR | Status: AC | PRN
  Administered 2020-11-12 (×2): 10 mL

## 2020-11-12 MED ORDER — LIDOCAINE-EPINEPHRINE 1 %-1:100000 IJ SOLN
INTRAMUSCULAR | Status: AC
  Filled 2020-11-12: qty 1

## 2020-11-12 MED ORDER — FENTANYL CITRATE (PF) 100 MCG/2ML IJ SOLN
INTRAMUSCULAR | Status: AC | PRN
  Administered 2020-11-12 (×2): 25 ug via INTRAVENOUS
  Administered 2020-11-12: 50 ug via INTRAVENOUS

## 2020-11-12 MED ORDER — LIDOCAINE HCL 1 % IJ SOLN
INTRAMUSCULAR | Status: AC
  Filled 2020-11-12: qty 20

## 2020-11-12 MED ORDER — FENTANYL CITRATE (PF) 100 MCG/2ML IJ SOLN
INTRAMUSCULAR | Status: AC
  Filled 2020-11-12: qty 2

## 2020-11-12 MED ORDER — MIDAZOLAM HCL 2 MG/2ML IJ SOLN
INTRAMUSCULAR | Status: AC
  Filled 2020-11-12: qty 2

## 2020-11-12 MED ORDER — MIDAZOLAM HCL 2 MG/2ML IJ SOLN
INTRAMUSCULAR | Status: AC | PRN
  Administered 2020-11-12 (×2): 1 mg via INTRAVENOUS

## 2020-11-12 MED ORDER — LIDOCAINE HCL (PF) 1 % IJ SOLN
INTRAMUSCULAR | Status: AC | PRN
Start: 2020-11-12 — End: 2020-11-12
  Administered 2020-11-12: 10 mL
  Administered 2020-11-12: 20 mL

## 2020-11-12 MED ORDER — CEFAZOLIN SODIUM-DEXTROSE 2-4 GM/100ML-% IV SOLN
INTRAVENOUS | Status: AC | PRN
  Administered 2020-11-12: 2 g via INTRAVENOUS

## 2020-11-12 NOTE — Procedures (Signed)
Interventional Radiology Procedure:   Indications: Stage 4 lung cancer  Procedure: Port placement  Findings: Right jugular port, tip at SVC/RA junction  Complications: None     EBL: Minimal, less than 10 ml  Plan: Discharge in one hour.  Keep port site and incisions dry for at least 24 hours.     [name] R. Valerya Maxton, MD  Pager: [phone]   

## 2020-11-12 NOTE — Consult Note (Signed)
Chief Complaint: Patient was seen in consultation today for Port-A-Cath placement  Referring Physician(s): Mohamed,Mohamed  Supervising Physician: Markus Daft  Patient Status: [facility] - Out-pt  History of Present Illness: Tiffany Owen is a 60 y.o. female, ex-smoker, with past medical history of CHF, COPD, hypertension, and stage IV non-small cell lung cancer, adenocarcinoma, diagnosed in May of this year.  She presented with left lower lobe lung mass in addition to left hilar and mediastinal lymphadenopathy as well as bilateral pulmonary nodules and metastatic disease to the ribs bilaterally.  She is currently undergoing chemotherapy.  She has poor venous access and presents today for Port-A-Cath placement to assist with additional treatment.  Past Medical History:  Diagnosis Date   Cancer [facility])    CHF (congestive heart failure) ([place])    COPD (chronic obstructive pulmonary disease) ([place])    Family history of lung cancer 10/29/2020   Hypertension    Pneumonia 04/17/2018    Past Surgical History:  Procedure Laterality Date   [place]; 1981; 1992   TUBAL LIGATION  1992    Allergies: Patient has no known allergies.  Medications: Prior to Admission medications   Medication Sig Start Date End Date Taking? Authorizing Provider  acetaminophen (TYLENOL) 500 MG tablet Take 2 tablets (1,000 mg total) by mouth every 8 (eight) hours. 10/20/20  Yes Ghimire, Henreitta Leber, MD  budesonide (PULMICORT) 0.5 MG/2ML nebulizer solution Take 0.5 mg by nebulization 2 (two) times daily as needed (Asthma). 10/09/20  Yes [provider]  carvedilol (COREG) 12.5 MG tablet Take 12.5 mg by mouth 2 (two) times daily with a meal.   Yes [provider]  Cholecalciferol (VITAMIN D3 PO) Take 1 capsule by mouth daily.   Yes [provider]  Cyanocobalamin (VITAMIN B-12 PO) Take 1 tablet by mouth daily.   Yes [provider]  folic acid (FOLVITE) 1 MG tablet Take 1 tablet  (1 mg total) by mouth See admin instructions. Take 1 mg daily by mouth Patient taking differently: Take 1 mg by mouth daily. Take 1 mg daily by mouth 11/02/20  Yes Mohamed, Julien Nordmann, MD  hydrochlorothiazide (HYDRODIURIL) 25 MG tablet Take 25 mg by mouth daily.   Yes [provider]  ipratropium-albuterol (DUONEB) 0.5-2.5 (3) MG/3ML SOLN Take 3 mLs by nebulization every 6 (six) hours as needed. Patient taking differently: Take 3 mLs by nebulization every 6 (six) hours as needed (Asthma). 04/19/18  Yes Oswald Hillock, MD  metFORMIN (GLUCOPHAGE-XR) 500 MG 24 hr tablet Take 500 mg by mouth daily with breakfast.   Yes [provider]  Multiple Vitamins-Minerals (ONE-A-DAY WOMENS PO) Take 1 tablet by mouth daily with breakfast.   Yes [provider]  OXYGEN Inhale 3 L/min into the lungs continuous.   Yes [provider]  prochlorperazine (COMPAZINE) 10 MG tablet Take 10 mg by mouth every 6 (six) hours as needed for nausea or vomiting.   Yes [provider]  Tiotropium Bromide-Olodaterol (STIOLTO RESPIMAT) 2.5-2.5 MCG/ACT AERS Inhale 2 puffs into the lungs daily. 05/11/18  Yes Rigoberto Noel, MD  ibuprofen (ADVIL) 800 MG tablet Take 1 tablet (800 mg total) by mouth every 6 (six) hours as needed for mild pain. Patient not taking: No sig reported 10/20/20   Jonetta Osgood, MD  lidocaine (LIDODERM) 5 % Place 1 patch onto the skin daily. Remove & Discard patch within 12 hours or as directed by MD 10/20/20   Jonetta Osgood, MD  LORazepam (ATIVAN) 0.5 MG  tablet One tab po 30 min before MRI. Repeat once if needed. 11/02/20   Curt Bears, MD  oxyCODONE (OXY IR/ROXICODONE) 5 MG immediate release tablet Take 1 tablet (5 mg total) by mouth every 8 (eight) hours as needed for moderate pain. Patient not taking: No sig reported 10/20/20   Jonetta Osgood, MD     Family History  Problem Relation Age of Onset   Lung cancer Mother        dx 92s; no smoking hx   Diabetes  Mellitus II Father    Lung cancer Father        dx 40s; smoking hx   Lung cancer Brother        d. >50; smoking hx   Bone cancer Brother        dx >50   Cancer Maternal Aunt        unknown type; dx after 18    Social History   Socioeconomic History   Marital status: Single    Spouse name: Not on file   Number of children: Not on file   Years of education: Not on file   Highest education level: Not on file  Occupational History   Not on file  Tobacco Use   Smoking status: Not on file   Smokeless tobacco: Never  Vaping Use   Vaping Use: Never used  Substance and Sexual Activity   Alcohol use: Never   Drug use: Never   Sexual activity: Not on file  Other Topics Concern   Not on file  Social History Narrative   ** Merged History Encounter **       Social Determinants of Health   Financial Resource Strain: Not on file  Food Insecurity: Not on file  Transportation Needs: Not on file  Physical Activity: Not on file  Stress: Not on file  Social Connections: Not on file    Denies fever,HA,CP,worsening dyspnea, cough, abd/back pain,N/V or bleeding  Review of Systems  Vital Signs: BP 131/69 (BP Location: Right Arm)   Pulse 89   Temp 98.5 F (36.9 C) (Oral)   Resp 20   Ht 4\' 10"  (1.473 m)   Wt 260 lb (117.9 kg)   SpO2 100%   BMI 54.34 kg/m   Physical Exam awake/alert; chest- CTA bilat ant; heart- RRR; abd- obese, soft,+BS,NT; no LE edema  Imaging: CT Head Wo Contrast  Result Date: 10/18/2020 CLINICAL DATA:  Mental status changes. EXAM: CT HEAD WITHOUT CONTRAST TECHNIQUE: Contiguous axial images were obtained from the base of the skull through the vertex without intravenous contrast. COMPARISON:  None. FINDINGS: Brain: There is no evidence for acute hemorrhage, hydrocephalus, mass lesion, or abnormal extra-axial fluid collection. No definite CT evidence for acute infarction. Intravascular contrast evident secondary to recent CTA Chest, limiting assessment for  subtle subarachnoid hemorrhage. Patchy low attenuation in the deep hemispheric and periventricular white matter is nonspecific, but likely reflects chronic microvascular ischemic demyelination. Vascular: Persistent enhancement of blood pool from recent CTA Chest . Skull: No evidence for fracture. No worrisome lytic or sclerotic lesion. Sinuses/Orbits: The visualized paranasal sinuses and mastoid air cells are clear. Visualized portions of the globes and intraorbital fat are unremarkable. Other: None. IMPRESSION: 1. No acute intracranial abnormality. 2. Atrophy with chronic small vessel white matter ischemic disease. Electronically Signed   By: Misty Stanley M.D.   On: 10/18/2020 13:27   CT Angio Chest PE W/Cm &/Or Wo Cm  Result Date: 10/18/2020 CLINICAL DATA:  60 year old female with  shortness of breath and elevated D-dimer. EXAM: CT ANGIOGRAPHY CHEST WITH CONTRAST TECHNIQUE: Multidetector CT imaging of the chest was performed using the standard protocol during bolus administration of intravenous contrast. Multiplanar CT image reconstructions and MIPs were obtained to evaluate the vascular anatomy. CONTRAST:  175mL OMNIPAQUE IOHEXOL 350 MG/ML SOLN COMPARISON:  04/15/2018 CT.  10/18/2020 prior radiographs FINDINGS: Cardiovascular: Satisfactory opacification of the pulmonary arteries to the segmental level. No evidence of pulmonary embolism. Cardiomegaly is noted. No thoracic aortic aneurysm or pericardial effusion identified. Mediastinum/Nodes: Enlarged LEFT hilar nodes are identified with a 1.7 cm index node (series 6: Image 47). Other UPPER limits of normal sized mediastinal lymph nodes are identified. The thyroid gland, trachea and esophagus are unremarkable. Lungs/Pleura: A 3 x 3.6 x 3.9 cm mass versus masslike consolidation is noted within the posteromedial aspect of the LEFT LOWER lobe (8:50). Multiple primarily subcentimeter bilateral pulmonary nodules are identified, many which are new from 04/15/2018.  Index nodules include a 1.4 x 1.4 x 2 cm LEFT apical nodule (8:15), a 8 mm LEFT UPPER lobe nodule (8:23) and 6 mm LEFT LOWER lobe nodule (8:62). Scattered areas of atelectasis within both lungs are identified. No pleural effusion or pneumothorax identified. Upper Abdomen: A nonspecific 1.8 cm hypodense splenic lesion (6:80) is noted. Probable hepatic steatosis identified. Musculoskeletal: New 3 mm lytic lesions within the LEFT 5th rib and LEFT 8th rib are identified) 8:35 and 8:60. A 6 mm area of sclerosis within the anterior RIGHT 5th rib is noted. No other new bony abnormalities are identified. Review of the MIP images confirms the above findings. IMPRESSION: 1. 3 x 3.6 x 3.9 cm mass versus masslike consolidation within the posteromedial LEFT LOWER lobe - suspicious for malignancy. Multiple bilateral pulmonary nodules and enlarged LEFT hilar lymph nodes worrisome for metastatic disease. 2. New 3 mm lytic lesions within the LEFT 5th rib and LEFT 8th rib - suspicious for metastatic disease. Indeterminate 6 mm sclerosis within the RIGHT 5th rib. 3. Indeterminate 1.8 cm splenic lesion. Consider elective MRI for further evaluation. 4. No evidence of pulmonary emboli or thoracic aortic aneurysm. 5. Cardiomegaly. 6. Probable hepatic steatosis. Electronically Signed   By: Margarette Canada M.D.   On: 10/18/2020 11:18   DG Chest Portable 1 View  Result Date: 10/18/2020 CLINICAL DATA:  Shortness of breath. EXAM: PORTABLE CHEST 1 VIEW COMPARISON:  None. FINDINGS: Mild cardiomegaly is noted. Mild left midlung subsegmental atelectasis is noted. No pneumothorax or significant pleural effusion is noted. Right lung is clear. Bony thorax is unremarkable. IMPRESSION: Mild left midlung subsegmental atelectasis. Electronically Signed   By: Marijo Conception M.D.   On: 10/18/2020 08:13   ECHOCARDIOGRAM COMPLETE  Result Date: 10/19/2020    ECHOCARDIOGRAM REPORT   Patient Name:   Tiffany Owen Date of Exam: 10/19/2020 Medical Rec #:  [id number]   Height:       58.0 in Accession #:    [id number] Weight:       271.2 lb Date of Birth:  12-Aug-1960  BSA:          2.073 m Patient Age:    47 years   BP:           138/78 mmHg Patient Gender: F          HR:           95 bpm. Exam Location:  Inpatient Procedure: 2D Echo, Cardiac Doppler and Color Doppler Indications:     Congestive heart failure  History:  Patient has prior history of Echocardiogram examinations, most                  recent 04/16/2018. COPD; Risk Factors:Hypertension,                  Dyslipidemia, Former Smoker and Sleep Apnea.  Sonographer:     Clayton Lefort RDCS (AE) Referring Phys:  [id number] [place] Diagnosing Phys: Charolette Forward MD IMPRESSIONS  1. Left ventricular ejection fraction, by estimation, is 55 to 60%. The left ventricle has normal function. The left ventricle has no regional wall motion abnormalities. Left ventricular diastolic parameters were normal.  2. Right ventricular systolic function is normal. The right ventricular size is normal. There is mildly elevated pulmonary artery systolic pressure.  3. There is no evidence of cardiac tamponade.  4. The mitral valve is normal in structure. Trivial mitral valve regurgitation.  5. The aortic valve is normal in structure. Aortic valve regurgitation is not visualized. No aortic stenosis is present.  6. The inferior vena cava is normal in size with greater than 50% respiratory variability, suggesting right atrial pressure of 3 mmHg. FINDINGS  Left Ventricle: Left ventricular ejection fraction, by estimation, is 55 to 60%. The left ventricle has normal function. The left ventricle has no regional wall motion abnormalities. The left ventricular internal cavity size was normal in size. There is  no left ventricular hypertrophy. Left ventricular diastolic parameters were normal. Right Ventricle: The right ventricular size is normal. No increase in right ventricular wall thickness. Right ventricular systolic function is  normal. There is mildly elevated pulmonary artery systolic pressure. The tricuspid regurgitant velocity is 2.99  m/s, and with an assumed right atrial pressure of 3 mmHg, the estimated right ventricular systolic pressure is 78.9 mmHg. Left Atrium: Left atrial size was normal in size. Right Atrium: Right atrial size was normal in size. Pericardium: Trivial pericardial effusion is present. There is no evidence of cardiac tamponade. Mitral Valve: The mitral valve is normal in structure. Trivial mitral valve regurgitation. MV peak gradient, 8.0 mmHg. The mean mitral valve gradient is 3.0 mmHg. Tricuspid Valve: The tricuspid valve is normal in structure. Tricuspid valve regurgitation is trivial. Aortic Valve: The aortic valve is normal in structure. Aortic valve regurgitation is not visualized. No aortic stenosis is present. Aortic valve mean gradient measures 7.0 mmHg. Aortic valve peak gradient measures 13.0 mmHg. Aortic valve area, by VTI measures 2.10 cm. Pulmonic Valve: The pulmonic valve was normal in structure. Pulmonic valve regurgitation is not visualized. Aorta: The aortic root is normal in size and structure. Venous: The inferior vena cava is normal in size with greater than 50% respiratory variability, suggesting right atrial pressure of 3 mmHg. IAS/Shunts: No atrial level shunt detected by color flow Doppler.  LEFT VENTRICLE PLAX 2D LVIDd:         3.70 cm  Diastology LVIDs:         2.30 cm  LV e' medial:    8.81 cm/s LV PW:         1.40 cm  LV E/e' medial:  13.2 LV IVS:        1.00 cm  LV e' lateral:   9.36 cm/s LVOT diam:     1.90 cm  LV E/e' lateral: 12.4 LV SV:         65 LV SV Index:   32 LVOT Area:     2.84 cm  RIGHT VENTRICLE  IVC RV Basal diam:  3.30 cm     IVC diam: 1.80 cm RV S prime:     14.60 cm/s TAPSE (M-mode): 2.9 cm LEFT ATRIUM             Index       RIGHT ATRIUM           Index LA diam:        4.00 cm 1.93 cm/m  RA Area:     16.40 cm LA Vol (A2C):   76.0 ml 36.67 ml/m RA  Volume:   47.60 ml  22.97 ml/m LA Vol (A4C):   68.8 ml 33.19 ml/m LA Biplane Vol: 74.7 ml 36.04 ml/m  AORTIC VALVE AV Area (Vmax):    2.17 cm AV Area (Vmean):   2.08 cm AV Area (VTI):     2.10 cm AV Vmax:           180.00 cm/s AV Vmean:          127.000 cm/s AV VTI:            0.312 m AV Peak Grad:      13.0 mmHg AV Mean Grad:      7.0 mmHg LVOT Vmax:         138.00 cm/s LVOT Vmean:        93.200 cm/s LVOT VTI:          0.231 m LVOT/AV VTI ratio: 0.74  AORTA Ao Root diam: 3.20 cm Ao Asc diam:  3.60 cm MITRAL VALVE                TRICUSPID VALVE MV Area (PHT): 2.64 cm     TR Peak grad:   35.8 mmHg MV Area VTI:   1.96 cm     TR Vmax:        299.00 cm/s MV Peak grad:  8.0 mmHg MV Mean grad:  3.0 mmHg     SHUNTS MV Vmax:       1.41 m/s     Systemic VTI:  0.23 m MV Vmean:      86.6 cm/s    Systemic Diam: 1.90 cm MV Decel Time: 287 msec MV E velocity: 116.00 cm/s MV A velocity: 128.00 cm/s MV E/A ratio:  0.91 Charolette Forward MD Electronically signed by Charolette Forward MD Signature Date/Time: 10/19/2020/10:36:35 AM    Final     Labs:  CBC: Recent Labs    10/22/20 1337 10/28/20 1359 11/02/20 0851 11/09/20 0917  WBC 4.0 6.0 6.3 3.6*  HGB 12.3 13.0 13.3 11.9*  HCT 39.1 41.7 42.0 37.8  PLT 142* 354 746* 403*    COAGS: Recent Labs    10/18/20 1126  INR 1.2  APTT 24    BMP: Recent Labs    10/22/20 1337 10/28/20 1359 11/02/20 0851 11/09/20 0917  NA 144 145 142 140  K 3.9 4.7 4.0 4.2  CL 94* 103 100 98  CO2 40* 34* 33* 33*  GLUCOSE 86 94 97 105*  BUN 13 12 11 14   CALCIUM 9.4 9.7 10.2 9.8  CREATININE 0.86 0.92 0.81 0.79  GFRNONAA >60 >60 >60 >60    LIVER FUNCTION TESTS: Recent Labs    10/22/20 1337 10/28/20 1359 11/02/20 0851 11/09/20 0917  BILITOT 0.4 <0.2* 0.3 0.5  AST 98* 49* 40 47*  ALT 124* 97* 55* 68*  ALKPHOS 109 98 93 99  PROT 6.3* 6.7 7.1 6.9  ALBUMIN 2.9* 2.9* 3.1* 3.1*    TUMOR MARKERS: No  results for input(s): AFPTM, CEA, CA199, CHROMGRNA in the last 8760  hours.  Assessment and Plan: 60 y.o. female, ex-smoker, with past medical history of CHF, COPD, hypertension, and stage IV non-small cell lung cancer, adenocarcinoma, diagnosed in May of this year.  She presented with left lower lobe lung mass in addition to left hilar and mediastinal lymphadenopathy as well as bilateral pulmonary nodules and metastatic disease to the ribs bilaterally.  She is currently undergoing chemotherapy.  She has poor venous access and presents today for Port-A-Cath placement to assist with additional treatment.Risks and benefits of image guided port-a-catheter placement was discussed with the patient including, but not limited to bleeding, infection, pneumothorax, or fibrin sheath development and need for additional procedures.  All of the patient's questions were answered, patient is agreeable to proceed. Consent signed and in chart.    Thank you for this interesting consult.  I greatly enjoyed meeting Tiffany Owen and look forward to participating in their care.  A copy of this report was sent to the requesting provider on this date.  Electronically Signed: D. Rowe Robert, PA-C 11/12/2020, 11:31 AM   I spent a total of  25 minutes   in face to face in clinical consultation, greater than 50% of which was counseling/coordinating care for port a cath placement

## 2020-11-16 ENCOUNTER — Inpatient Hospital Stay: Payer: BC Managed Care – PPO

## 2020-11-16 ENCOUNTER — Other Ambulatory Visit: Payer: Self-pay

## 2020-11-16 ENCOUNTER — Ambulatory Visit (HOSPITAL_COMMUNITY)
Admission: RE | Admit: 2020-11-16 | Discharge: 2020-11-16 | Disposition: A | Discharge status: Home / Self Care | Payer: BC Managed Care – PPO | Source: Ambulatory Visit | Attending: Internal Medicine | Admitting: Internal Medicine

## 2020-11-16 ENCOUNTER — Encounter: Payer: Self-pay | Admitting: Internal Medicine

## 2020-11-16 ENCOUNTER — Inpatient Hospital Stay: Payer: BC Managed Care – PPO | Attending: Internal Medicine

## 2020-11-16 DIAGNOSIS — C3492 Malignant neoplasm of unspecified part of left bronchus or lung: Secondary | ICD-10-CM | POA: Diagnosis not present

## 2020-11-16 DIAGNOSIS — Z79899 Other long term (current) drug therapy: Secondary | ICD-10-CM | POA: Diagnosis not present

## 2020-11-16 DIAGNOSIS — I110 Hypertensive heart disease with heart failure: Secondary | ICD-10-CM | POA: Insufficient documentation

## 2020-11-16 DIAGNOSIS — C3490 Malignant neoplasm of unspecified part of unspecified bronchus or lung: Secondary | ICD-10-CM | POA: Insufficient documentation

## 2020-11-16 DIAGNOSIS — J90 Pleural effusion, not elsewhere classified: Secondary | ICD-10-CM | POA: Diagnosis not present

## 2020-11-16 DIAGNOSIS — Z5112 Encounter for antineoplastic immunotherapy: Secondary | ICD-10-CM | POA: Diagnosis not present

## 2020-11-16 DIAGNOSIS — M47814 Spondylosis without myelopathy or radiculopathy, thoracic region: Secondary | ICD-10-CM | POA: Diagnosis not present

## 2020-11-16 DIAGNOSIS — Z87891 Personal history of nicotine dependence: Secondary | ICD-10-CM | POA: Insufficient documentation

## 2020-11-16 DIAGNOSIS — I509 Heart failure, unspecified: Secondary | ICD-10-CM | POA: Diagnosis not present

## 2020-11-16 DIAGNOSIS — Z5111 Encounter for antineoplastic chemotherapy: Secondary | ICD-10-CM | POA: Diagnosis not present

## 2020-11-16 DIAGNOSIS — E538 Deficiency of other specified B group vitamins: Secondary | ICD-10-CM | POA: Insufficient documentation

## 2020-11-16 DIAGNOSIS — Z452 Encounter for adjustment and management of vascular access device: Secondary | ICD-10-CM | POA: Insufficient documentation

## 2020-11-16 DIAGNOSIS — D1809 Hemangioma of other sites: Secondary | ICD-10-CM | POA: Diagnosis not present

## 2020-11-16 DIAGNOSIS — C7951 Secondary malignant neoplasm of bone: Secondary | ICD-10-CM | POA: Diagnosis not present

## 2020-11-16 DIAGNOSIS — J449 Chronic obstructive pulmonary disease, unspecified: Secondary | ICD-10-CM | POA: Insufficient documentation

## 2020-11-16 DIAGNOSIS — H0520 Unspecified exophthalmos: Secondary | ICD-10-CM | POA: Diagnosis not present

## 2020-11-16 DIAGNOSIS — G9389 Other specified disorders of brain: Secondary | ICD-10-CM | POA: Diagnosis not present

## 2020-11-16 DIAGNOSIS — Z95828 Presence of other vascular implants and grafts: Secondary | ICD-10-CM

## 2020-11-16 LAB — CBC WITH DIFFERENTIAL (CANCER CENTER ONLY)
Abs Immature Granulocytes: 0.01 10*3/uL (ref 0.00–0.07)
Basophils Absolute: 0 10*3/uL (ref 0.0–0.1)
Basophils Relative: 0 %
Eosinophils Absolute: 0.2 10*3/uL (ref 0.0–0.5)
Eosinophils Relative: 3 %
HCT: 33.6 % — ABNORMAL LOW (ref 36.0–46.0)
Hemoglobin: 10.6 g/dL — ABNORMAL LOW (ref 12.0–15.0)
Immature Granulocytes: 0 %
Lymphocytes Relative: 35 %
Lymphs Abs: 1.6 10*3/uL (ref 0.7–4.0)
MCH: 29.1 pg (ref 26.0–34.0)
MCHC: 31.5 g/dL (ref 30.0–36.0)
MCV: 92.3 fL (ref 80.0–100.0)
Monocytes Absolute: 0.9 10*3/uL (ref 0.1–1.0)
Monocytes Relative: 19 %
Neutro Abs: 2 10*3/uL (ref 1.7–7.7)
Neutrophils Relative %: 43 %
Platelet Count: 123 10*3/uL — ABNORMAL LOW (ref 150–400)
RBC: 3.64 MIL/uL — ABNORMAL LOW (ref 3.87–5.11)
RDW: 13.2 % (ref 11.5–15.5)
WBC Count: 4.7 10*3/uL (ref 4.0–10.5)
nRBC: 0 % (ref 0.0–0.2)

## 2020-11-16 LAB — CMP (CANCER CENTER ONLY)
ALT: 48 U/L — ABNORMAL HIGH (ref 0–44)
AST: 33 U/L (ref 15–41)
Albumin: 3.1 g/dL — ABNORMAL LOW (ref 3.5–5.0)
Alkaline Phosphatase: 89 U/L (ref 38–126)
Anion gap: 8 (ref 5–15)
BUN: 9 mg/dL (ref 8–23)
CO2: 32 mmol/L (ref 22–32)
Calcium: 9.3 mg/dL (ref 8.9–10.3)
Chloride: 101 mmol/L (ref 98–111)
Creatinine: 0.7 mg/dL (ref 0.44–1.00)
GFR, Estimated: >60 mL/min (ref >60)
Glucose, Bld: 105 mg/dL — ABNORMAL HIGH (ref 70–99)
Potassium: 3.4 mmol/L — ABNORMAL LOW (ref 3.5–5.1)
Sodium: 141 mmol/L (ref 135–145)
Total Bilirubin: 0.2 mg/dL — ABNORMAL LOW (ref 0.3–1.2)
Total Protein: 6.5 g/dL (ref 6.5–8.1)

## 2020-11-16 LAB — TSH: TSH: 0.671 u[IU]/mL (ref 0.308–3.960)

## 2020-11-16 MED ORDER — HEPARIN SOD (PORK) LOCK FLUSH 100 UNIT/ML IV SOLN
500.0000 [IU] | Freq: Once | INTRAVENOUS | Status: AC
Start: 2020-11-16 — End: 2020-11-16
  Administered 2020-11-16: 500 [IU]
  Filled 2020-11-16: qty 5

## 2020-11-16 MED ORDER — SODIUM CHLORIDE 0.9% FLUSH
10.0000 mL | Freq: Once | INTRAVENOUS | Status: AC
  Administered 2020-11-16: 10 mL
  Filled 2020-11-16: qty 10

## 2020-11-16 MED ORDER — GADOBUTROL 1 MMOL/ML IV SOLN
10.0000 mL | Freq: Once | INTRAVENOUS | Status: AC | PRN
  Administered 2020-11-16: 10 mL via INTRAVENOUS

## 2020-11-16 MED ORDER — SODIUM CHLORIDE 0.9% FLUSH
10.0000 mL | Freq: Once | INTRAVENOUS | Status: AC
  Administered 2020-11-16: 10 mL via INTRAVENOUS
  Filled 2020-11-16: qty 10

## 2020-11-16 MED ORDER — LIDOCAINE-PRILOCAINE 2.5-2.5 % EX CREA: 1.0000 "application " | TOPICAL_CREAM | CUTANEOUS | 0 refills | Status: DC | PRN

## 2020-11-16 NOTE — Addendum Note (Signed)
Addended by: Gardiner Rhyme on: 11/16/2020 12:11 PM   Modules accepted: Orders

## 2020-11-16 NOTE — Addendum Note (Signed)
Addended by: Gardiner Rhyme on: 11/16/2020 12:16 PM   Modules accepted: Orders

## 2020-11-16 NOTE — Patient Instructions (Signed)
Implanted Port Home Guide An implanted port is a device that is placed under the skin. It is usually placed in the chest. The device can be used to give IV medicine, to take blood, or for dialysis. You may have an implanted port if: You need IV medicine that would be irritating to the small veins in your hands or arms. You need IV medicines, such as antibiotics, for a long period of time. You need IV nutrition for a long period of time. You need dialysis. When you have a port, your health care provider can choose to use the port instead of veins in your arms for these procedures. You may have fewer limitations when using a port than you would if you used other types of long-term IVs, and you will likely be able to return to normal activities afteryour incision heals. An implanted port has two main parts: Reservoir. The reservoir is the part where a needle is inserted to give medicines or draw blood. The reservoir is round. After it is placed, it appears as a small, raised area under your skin. Catheter. The catheter is a thin, flexible tube that connects the reservoir to a vein. Medicine that is inserted into the reservoir goes into the catheter and then into the vein. How is my port accessed? To access your port: A numbing cream may be placed on the skin over the port site. Your health care provider will put on a mask and sterile gloves. The skin over your port will be cleaned carefully with a germ-killing soap and allowed to dry. Your health care provider will gently pinch the port and insert a needle into it. Your health care provider will check for a blood return to make sure the port is in the vein and is not clogged. If your port needs to remain accessed to get medicine continuously (constant infusion), your health care provider will place a clear bandage (dressing) over the needle site. The dressing and needle will need to be changed every week, or as told by your health care provider. What  is flushing? Flushing helps keep the port from getting clogged. Follow instructions from your health care provider about how and when to flush the port. Ports are usually flushed with saline solution or a medicine called heparin. The need for flushing will depend on how the port is used: If the port is only used from time to time to give medicines or draw blood, the port may need to be flushed: Before and after medicines have been given. Before and after blood has been drawn. As part of routine maintenance. Flushing may be recommended every 4-6 weeks. If a constant infusion is running, the port may not need to be flushed. Throw away any syringes in a disposal container that is meant for sharp items (sharps container). You can buy a sharps container from a pharmacy, or you can make one by using an empty hard plastic bottle with a cover. How long will my port stay implanted? The port can stay in for as long as your health care provider thinks it is needed. When it is time for the port to come out, a surgery will be done to remove it. The surgery will be similar to the procedure that was done to putthe port in. Follow these instructions at home:  Flush your port as told by your health care provider. If you need an infusion over several days, follow instructions from your health care provider about how to take   care of your port site. Make sure you: Wash your hands with soap and water before you change your dressing. If soap and water are not available, use alcohol-based hand sanitizer. Change your dressing as told by your health care provider. Place any used dressings or infusion bags into a plastic bag. Throw that bag in the trash. Keep the dressing that covers the needle clean and dry. Do not get it wet. Do not use scissors or sharp objects near the tube. Keep the tube clamped, unless it is being used. Check your port site every day for signs of infection. Check for: Redness, swelling, or  pain. Fluid or blood. Pus or a bad smell. Protect the skin around the port site. Avoid wearing bra straps that rub or irritate the site. Protect the skin around your port from seat belts. Place a soft pad over your chest if needed. Bathe or shower as told by your health care provider. The site may get wet as long as you are not actively receiving an infusion. Return to your normal activities as told by your health care provider. Ask your health care provider what activities are safe for you. Carry a medical alert card or wear a medical alert bracelet at all times. This will let health care providers know that you have an implanted port in case of an emergency. Get help right away if: You have redness, swelling, or pain at the port site. You have fluid or blood coming from your port site. You have pus or a bad smell coming from the port site. You have a fever. Summary Implanted ports are usually placed in the chest for long-term IV access. Follow instructions from your health care provider about flushing the port and changing bandages (dressings). Take care of the area around your port by avoiding clothing that puts pressure on the area, and by watching for signs of infection. Protect the skin around your port from seat belts. Place a soft pad over your chest if needed. Get help right away if you have a fever or you have redness, swelling, pain, drainage, or a bad smell at the port site. This information is not intended to replace advice given to you by your health care provider. Make sure you discuss any questions you have with your healthcare provider. Document Revised: 08/19/2019 Document Reviewed: 08/19/2019 Elsevier Patient Education  2022 Elsevier Inc.  

## 2020-11-17 ENCOUNTER — Telehealth: Payer: Self-pay

## 2020-11-17 DIAGNOSIS — R0902 Hypoxemia: Secondary | ICD-10-CM | POA: Diagnosis not present

## 2020-11-17 NOTE — Telephone Encounter (Signed)
Notified Patient of prior authorization approval for Lidocaine-Prilocaine 2.5% Cream. Authorization is effective from 8/2/222 through 02/14/21

## 2020-11-19 DIAGNOSIS — F5104 Psychophysiologic insomnia: Secondary | ICD-10-CM | POA: Diagnosis not present

## 2020-11-19 DIAGNOSIS — I10 Essential (primary) hypertension: Secondary | ICD-10-CM | POA: Diagnosis not present

## 2020-11-19 DIAGNOSIS — J449 Chronic obstructive pulmonary disease, unspecified: Secondary | ICD-10-CM | POA: Diagnosis not present

## 2020-11-19 DIAGNOSIS — G4719 Other hypersomnia: Secondary | ICD-10-CM | POA: Diagnosis not present

## 2020-11-20 DIAGNOSIS — E872 Acidosis: Secondary | ICD-10-CM | POA: Diagnosis not present

## 2020-11-20 DIAGNOSIS — R531 Weakness: Secondary | ICD-10-CM | POA: Diagnosis not present

## 2020-11-21 DIAGNOSIS — J449 Chronic obstructive pulmonary disease, unspecified: Secondary | ICD-10-CM | POA: Diagnosis not present

## 2020-11-21 DIAGNOSIS — J9610 Chronic respiratory failure, unspecified whether with hypoxia or hypercapnia: Secondary | ICD-10-CM | POA: Diagnosis not present

## 2020-11-23 ENCOUNTER — Inpatient Hospital Stay: Payer: BC Managed Care – PPO

## 2020-11-23 ENCOUNTER — Inpatient Hospital Stay (HOSPITAL_BASED_OUTPATIENT_CLINIC_OR_DEPARTMENT_OTHER): Payer: BC Managed Care – PPO | Admitting: Internal Medicine

## 2020-11-23 ENCOUNTER — Encounter: Payer: Self-pay | Admitting: Internal Medicine

## 2020-11-23 ENCOUNTER — Telehealth: Payer: Self-pay

## 2020-11-23 ENCOUNTER — Other Ambulatory Visit: Payer: Self-pay

## 2020-11-23 VITALS — BP 132/57 | HR 77 | Temp 95.5°F | Resp 20 | Ht <= 58 in | Wt 263.9 lb

## 2020-11-23 DIAGNOSIS — Z79899 Other long term (current) drug therapy: Secondary | ICD-10-CM | POA: Diagnosis not present

## 2020-11-23 DIAGNOSIS — Z5111 Encounter for antineoplastic chemotherapy: Secondary | ICD-10-CM | POA: Diagnosis not present

## 2020-11-23 DIAGNOSIS — Z5112 Encounter for antineoplastic immunotherapy: Secondary | ICD-10-CM

## 2020-11-23 DIAGNOSIS — C3492 Malignant neoplasm of unspecified part of left bronchus or lung: Secondary | ICD-10-CM

## 2020-11-23 DIAGNOSIS — Z95828 Presence of other vascular implants and grafts: Secondary | ICD-10-CM

## 2020-11-23 DIAGNOSIS — E538 Deficiency of other specified B group vitamins: Secondary | ICD-10-CM | POA: Diagnosis not present

## 2020-11-23 DIAGNOSIS — C3490 Malignant neoplasm of unspecified part of unspecified bronchus or lung: Secondary | ICD-10-CM

## 2020-11-23 DIAGNOSIS — Z87891 Personal history of nicotine dependence: Secondary | ICD-10-CM | POA: Diagnosis not present

## 2020-11-23 DIAGNOSIS — Z452 Encounter for adjustment and management of vascular access device: Secondary | ICD-10-CM | POA: Diagnosis not present

## 2020-11-23 DIAGNOSIS — I509 Heart failure, unspecified: Secondary | ICD-10-CM | POA: Diagnosis not present

## 2020-11-23 DIAGNOSIS — C7951 Secondary malignant neoplasm of bone: Secondary | ICD-10-CM

## 2020-11-23 DIAGNOSIS — I110 Hypertensive heart disease with heart failure: Secondary | ICD-10-CM | POA: Diagnosis not present

## 2020-11-23 DIAGNOSIS — J449 Chronic obstructive pulmonary disease, unspecified: Secondary | ICD-10-CM | POA: Diagnosis not present

## 2020-11-23 LAB — CMP (CANCER CENTER ONLY)
ALT: 65 U/L — ABNORMAL HIGH (ref 0–44)
AST: 56 U/L — ABNORMAL HIGH (ref 15–41)
Albumin: 3.2 g/dL — ABNORMAL LOW (ref 3.5–5.0)
Alkaline Phosphatase: 82 U/L (ref 38–126)
Anion gap: 10 (ref 5–15)
BUN: 8 mg/dL (ref 8–23)
CO2: 30 mmol/L (ref 22–32)
Calcium: 9 mg/dL (ref 8.9–10.3)
Chloride: 103 mmol/L (ref 98–111)
Creatinine: 0.77 mg/dL (ref 0.44–1.00)
GFR, Estimated: >60 mL/min (ref >60)
Glucose, Bld: 117 mg/dL — ABNORMAL HIGH (ref 70–99)
Potassium: 3.7 mmol/L (ref 3.5–5.1)
Sodium: 143 mmol/L (ref 135–145)
Total Bilirubin: <0.2 mg/dL — ABNORMAL LOW (ref 0.3–1.2)
Total Protein: 6.6 g/dL (ref 6.5–8.1)

## 2020-11-23 LAB — CBC WITH DIFFERENTIAL (CANCER CENTER ONLY)
Abs Immature Granulocytes: 0.02 10*3/uL (ref 0.00–0.07)
Basophils Absolute: 0 10*3/uL (ref 0.0–0.1)
Basophils Relative: 1 %
Eosinophils Absolute: 0.1 10*3/uL (ref 0.0–0.5)
Eosinophils Relative: 3 %
HCT: 35.8 % — ABNORMAL LOW (ref 36.0–46.0)
Hemoglobin: 11.5 g/dL — ABNORMAL LOW (ref 12.0–15.0)
Immature Granulocytes: 1 %
Lymphocytes Relative: 36 %
Lymphs Abs: 1.6 10*3/uL (ref 0.7–4.0)
MCH: 29.9 pg (ref 26.0–34.0)
MCHC: 32.1 g/dL (ref 30.0–36.0)
MCV: 93 fL (ref 80.0–100.0)
Monocytes Absolute: 0.7 10*3/uL (ref 0.1–1.0)
Monocytes Relative: 15 %
Neutro Abs: 1.9 10*3/uL (ref 1.7–7.7)
Neutrophils Relative %: 44 %
Platelet Count: 235 10*3/uL (ref 150–400)
RBC: 3.85 MIL/uL — ABNORMAL LOW (ref 3.87–5.11)
RDW: 15 % (ref 11.5–15.5)
WBC Count: 4.3 10*3/uL (ref 4.0–10.5)
nRBC: 0 % (ref 0.0–0.2)

## 2020-11-23 LAB — TSH: TSH: 0.789 u[IU]/mL (ref 0.308–3.960)

## 2020-11-23 MED ORDER — PALONOSETRON HCL INJECTION 0.25 MG/5ML
INTRAVENOUS | Status: AC
  Filled 2020-11-23: qty 5

## 2020-11-23 MED ORDER — SODIUM CHLORIDE 0.9 % IV SOLN
500.0000 mg/m2 | Freq: Once | INTRAVENOUS | Status: AC
  Administered 2020-11-23: 1100 mg via INTRAVENOUS
  Filled 2020-11-23: qty 4

## 2020-11-23 MED ORDER — SODIUM CHLORIDE 0.9 % IV SOLN
750.0000 mg | Freq: Once | INTRAVENOUS | Status: AC
  Administered 2020-11-23: 750 mg via INTRAVENOUS
  Filled 2020-11-23: qty 75

## 2020-11-23 MED ORDER — SODIUM CHLORIDE 0.9% FLUSH
10.0000 mL | INTRAVENOUS | Status: DC | PRN
Start: 2020-11-23 — End: 2020-11-23
  Administered 2020-11-23: 10 mL
  Filled 2020-11-23: qty 10

## 2020-11-23 MED ORDER — SODIUM CHLORIDE 0.9 % IV SOLN
200.0000 mg | Freq: Once | INTRAVENOUS | Status: AC
  Administered 2020-11-23: 200 mg via INTRAVENOUS
  Filled 2020-11-23: qty 8

## 2020-11-23 MED ORDER — SODIUM CHLORIDE 0.9% FLUSH
10.0000 mL | Freq: Once | INTRAVENOUS | Status: AC
  Administered 2020-11-23: 10 mL
  Filled 2020-11-23: qty 10

## 2020-11-23 MED ORDER — PALONOSETRON HCL INJECTION 0.25 MG/5ML
0.2500 mg | Freq: Once | INTRAVENOUS | Status: AC
  Administered 2020-11-23: 0.25 mg via INTRAVENOUS

## 2020-11-23 MED ORDER — SODIUM CHLORIDE 0.9 % IV SOLN
10.0000 mg | Freq: Once | INTRAVENOUS | Status: AC
  Administered 2020-11-23: 10 mg via INTRAVENOUS
  Filled 2020-11-23: qty 10

## 2020-11-23 MED ORDER — HEPARIN SOD (PORK) LOCK FLUSH 100 UNIT/ML IV SOLN
500.0000 [IU] | Freq: Once | INTRAVENOUS | Status: AC | PRN
  Administered 2020-11-23: 500 [IU]
  Filled 2020-11-23: qty 5

## 2020-11-23 MED ORDER — SODIUM CHLORIDE 0.9 % IV SOLN
150.0000 mg | Freq: Once | INTRAVENOUS | Status: AC
  Administered 2020-11-23: 150 mg via INTRAVENOUS
  Filled 2020-11-23: qty 150

## 2020-11-23 MED ORDER — SODIUM CHLORIDE 0.9 % IV SOLN
Freq: Once | INTRAVENOUS | Status: AC
  Filled 2020-11-23: qty 250

## 2020-11-23 NOTE — Addendum Note (Signed)
Addended by: Ardeen Garland on: 11/23/2020 09:58 AM   Modules accepted: Orders

## 2020-11-23 NOTE — Telephone Encounter (Signed)
Spoke with patient's daughter [name] and scheduled an in-person Palliative Consult for 12/24/20 @ 12:30PM.   COVID screening was negative. No pets in home. Patient lives with daughter.   Consent obtained; updated Outlook/Netsmart/Team List and Epic.   Family is aware they may be receiving a call from provider the day before or day of to confirm appointment.

## 2020-11-23 NOTE — Patient Instructions (Signed)
[facility]  Discharge Instructions: Thank you for choosing [facility] to provide your oncology and hematology care.   If you have a lab appointment with the [facility], please go directly to the [facility] and check in at the registration area.   Wear comfortable clothing and clothing appropriate for easy access to any Portacath or PICC line.   We strive to give you quality time with your provider. You may need to reschedule your appointment if you arrive late (15 or more minutes).  Arriving late affects you and other patients whose appointments are after yours.  Also, if you miss three or more appointments without notifying the office, you may be dismissed from the clinic at the provider's discretion.      For prescription refill requests, have your pharmacy contact our office and allow 72 hours for refills to be completed.    Today you received the following chemotherapy and/or immunotherapy agents Keytruda; Alimta; Carboplatin      To help prevent nausea and vomiting after your treatment, we encourage you to take your nausea medication as directed.  BELOW ARE SYMPTOMS THAT SHOULD BE REPORTED IMMEDIATELY: *FEVER GREATER THAN 100.4 F (38 C) OR HIGHER *CHILLS OR SWEATING *NAUSEA AND VOMITING THAT IS NOT CONTROLLED WITH YOUR NAUSEA MEDICATION *UNUSUAL SHORTNESS OF BREATH *UNUSUAL BRUISING OR BLEEDING *URINARY PROBLEMS (pain or burning when urinating, or frequent urination) *BOWEL PROBLEMS (unusual diarrhea, constipation, pain near the anus) TENDERNESS IN MOUTH AND THROAT WITH OR WITHOUT PRESENCE OF ULCERS (sore throat, sores in mouth, or a toothache) UNUSUAL RASH, SWELLING OR PAIN  UNUSUAL VAGINAL DISCHARGE OR ITCHING   Items with * indicate a potential emergency and should be followed up as soon as possible or go to the Emergency Department if any problems should occur.  Please show the CHEMOTHERAPY ALERT CARD or IMMUNOTHERAPY ALERT  CARD at check-in to the Emergency Department and triage nurse.  Should you have questions after your visit or need to cancel or reschedule your appointment, please contact [facility]  Dept: [phone]  and follow the prompts.  Office hours are 8:00 a.m. to 4:30 p.m. Monday - Friday. Please note that voicemails left after 4:00 p.m. may not be returned until the following business day.  We are closed weekends and major holidays. You have access to a nurse at all times for urgent questions. Please call the main number to the clinic Dept: [phone] and follow the prompts.   For any non-urgent questions, you may also contact your provider using MyChart. We now offer e-Visits for anyone 53 and older to request care online for non-urgent symptoms. For details visit [email or web address].   Also download the MyChart app! Go to the app store, search "MyChart", open the app, select [facility], and log in with your MyChart username and password.  Due to Covid, a mask is required upon entering the hospital/clinic. If you do not have a mask, one will be given to you upon arrival. For doctor visits, patients may have 1 support person aged 11 or older with them. For treatment visits, patients cannot have anyone with them due to current Covid guidelines and our immunocompromised population.

## 2020-11-23 NOTE — Progress Notes (Signed)
[facility] Telephone:([phone]   Fax:([phone]) [phone]  OFFICE PROGRESS NOTE  Koirala, Dibas, MD [address] [place] [address]  DIAGNOSIS: Stage IV (T2a, N2, M1b) non-small cell lung cancer, adenocarcinoma diagnosed in May 2022 and presented with left lower lobe lung mass in addition to left hilar and mediastinal lymphadenopathy as well as bilateral pulmonary nodules and metastatic disease to the ribs bilaterally. 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  PRIOR THERAPY: None  CURRENT THERAPY: Systemic chemotherapy with carboplatin for AUC of 5, Alimta 500 Mg/M2 and Keytruda 200 Mg IV every 3 weeks.  Started at [facility] on 10/13/2020. Status post 2 cycles.  INTERVAL HISTORY: Tiffany Owen 60 y.o. female returns to the clinic today for follow-up visit accompanied by her daughter.  The patient tolerated the second cycle of her chemotherapy fairly well with no concerning adverse effects.  She continues to have the baseline shortness of breath and she is currently on home oxygen.  She denied having any chest pain, cough or hemoptysis.  She has no nausea, vomiting, diarrhea or constipation.  She has no headache or visual changes.  She has no significant weight loss or night sweats.  She had MRI of the brain as well as cervical spine performed recently and she is here for evaluation before starting cycle #3.  MEDICAL HISTORY: Past Medical History:  Diagnosis Date   Cancer [facility])    CHF (congestive heart failure) (HCC)    COPD (chronic obstructive pulmonary disease) ([facility])    Family history of lung cancer 10/29/2020   Hypertension    Pneumonia 04/17/2018    ALLERGIES:  has No Known Allergies.  MEDICATIONS:  Current Outpatient Medications  Medication Sig Dispense Refill   acetaminophen (TYLENOL) 500 MG tablet Take 2 tablets (1,000 mg total) by mouth every 8 (eight) hours. 30  tablet 0   budesonide (PULMICORT) 0.5 MG/2ML nebulizer solution Take 0.5 mg by nebulization 2 (two) times daily as needed (Asthma).     carvedilol (COREG) 12.5 MG tablet Take 12.5 mg by mouth 2 (two) times daily with a meal.     Cholecalciferol (VITAMIN D3 PO) Take 1 capsule by mouth daily.     Cyanocobalamin (VITAMIN B-12 PO) Take 1 tablet by mouth daily.     folic acid (FOLVITE) 1 MG tablet Take 1 tablet (1 mg total) by mouth See admin instructions. Take 1 mg daily by mouth (Patient taking differently: Take 1 mg by mouth daily. Take 1 mg daily by mouth) 30 tablet 2   hydrochlorothiazide (HYDRODIURIL) 25 MG tablet Take 25 mg by mouth daily.     ibuprofen (ADVIL) 800 MG tablet Take 1 tablet (800 mg total) by mouth every 6 (six) hours as needed for mild pain. (Patient not taking: No sig reported) 30 tablet 0   ipratropium-albuterol (DUONEB) 0.5-2.5 (3) MG/3ML SOLN Take 3 mLs by nebulization every 6 (six) hours as needed. (Patient taking differently: Take 3 mLs by nebulization every 6 (six) hours as needed (Asthma).) 360 mL 1   lidocaine (LIDODERM) 5 % Place 1 patch onto the skin daily. Remove & Discard patch within 12 hours or as directed by MD 30 patch 0   lidocaine-prilocaine (EMLA) cream Apply 1 application topically as needed (for port access). 30 g 0   LORazepam (ATIVAN) 0.5 MG tablet One tab po 30 min before MRI. Repeat once if needed. 2  tablet 0   metFORMIN (GLUCOPHAGE-XR) 500 MG 24 hr tablet Take 500 mg by mouth daily with breakfast.     Multiple Vitamins-Minerals (ONE-A-DAY WOMENS PO) Take 1 tablet by mouth daily with breakfast.     oxyCODONE (OXY IR/ROXICODONE) 5 MG immediate release tablet Take 1 tablet (5 mg total) by mouth every 8 (eight) hours as needed for moderate pain. (Patient not taking: No sig reported) 20 tablet 0   OXYGEN Inhale 3 L/min into the lungs continuous.     prochlorperazine (COMPAZINE) 10 MG tablet Take 10 mg by mouth every 6 (six) hours as needed for nausea or  vomiting.     Tiotropium Bromide-Olodaterol (STIOLTO RESPIMAT) 2.5-2.5 MCG/ACT AERS Inhale 2 puffs into the lungs daily. 1 Inhaler 0   No current facility-administered medications for this visit.    SURGICAL HISTORY:  Past Surgical History:  Procedure Laterality Date   CESAREAN SECTION  1979; 1981; 1992   IR IMAGING GUIDED PORT INSERTION  11/12/2020   TUBAL LIGATION  1992    REVIEW OF SYSTEMS:  A comprehensive review of systems was negative except for: Constitutional: positive for fatigue Respiratory: positive for dyspnea on exertion Musculoskeletal: positive for arthralgias and muscle weakness   PHYSICAL EXAMINATION: General appearance: alert, cooperative, fatigued, and no distress Head: Normocephalic, without obvious abnormality, atraumatic Neck: no adenopathy, no JVD, supple, symmetrical, trachea midline, and thyroid not enlarged, symmetric, no tenderness/mass/nodules Lymph nodes: Cervical, supraclavicular, and axillary nodes normal. Resp: clear to auscultation bilaterally Back: symmetric, no curvature. ROM normal. No CVA tenderness. Cardio: regular rate and rhythm, S1, S2 normal, no murmur, click, rub or gallop GI: soft, non-tender; bowel sounds normal; no masses,  no organomegaly Extremities: extremities normal, atraumatic, no cyanosis or edema  ECOG PERFORMANCE STATUS: 1 - Symptomatic but completely ambulatory  Blood pressure (!) 132/57, pulse 77, temperature (!) 95.5 F (35.3 C), temperature source Tympanic, resp. rate 20, height '4\' 10"'  (1.473 m), weight 263 lb 14.4 oz (119.7 kg), SpO2 97 %.  LABORATORY DATA: Lab Results  Component Value Date   WBC 4.3 11/23/2020   HGB 11.5 (L) 11/23/2020   HCT 35.8 (L) 11/23/2020   MCV 93.0 11/23/2020   PLT 235 11/23/2020      Chemistry      Component Value Date/Time   NA 141 11/16/2020 0912   K 3.4 (L) 11/16/2020 0912   CL 101 11/16/2020 0912   CO2 32 11/16/2020 0912   BUN 9 11/16/2020 0912   CREATININE 0.70 11/16/2020 0912       Component Value Date/Time   CALCIUM 9.3 11/16/2020 0912   ALKPHOS 89 11/16/2020 0912   AST 33 11/16/2020 0912   ALT 48 (H) 11/16/2020 0912   BILITOT 0.2 (L) 11/16/2020 0912       RADIOGRAPHIC STUDIES: MR BRAIN W WO CONTRAST  Result Date: 11/16/2020 CLINICAL DATA:  Malignant neoplasm of unspecified part of unspecified bronchus or lung (HCC) C34.90 (ICD-10-CM). Non-small cell lung cancer, staging. EXAM: MRI HEAD WITHOUT AND WITH CONTRAST TECHNIQUE: Multiplanar, multiecho pulse sequences of the brain and surrounding structures were obtained without and with intravenous contrast. CONTRAST:  74m GADAVIST GADOBUTROL 1 MMOL/ML IV SOLN COMPARISON:  Head CT October 18, 2020 FINDINGS: Brain: No acute infarction, hemorrhage, hydrocephalus, extra-axial collection or mass lesion. Scattered and confluent foci of T2 hyperintensity are seen within the white the cerebral hemispheres and within the pons, nonspecific, most likely related to small vessel ischemia. No focus of abnormal contrast enhancement. Enlarged, partial empty sella. Vascular: Normal flow  voids. Skull and upper cervical spine: Reversal of the cervical curvature. No focal marrow lesion. Sinuses/Orbits: Bilateral proptosis.  Paranasal sinuses are clear Other: Trace bilateral mastoid effusion. IMPRESSION: 1. No evidence of intracranial metastatic disease. 2. Moderate amount of nonspecific T2 hyperintense lesions of the white matter, may be seen in the setting of chronic microangiopathy. 3. Enlarged, empty sella. May be seen in the setting of idiopathic intracranial hypertension in the appropriate clinical scenario. Electronically Signed   By: Pedro Earls M.D.   On: 11/16/2020 18:21   MR THORACIC SPINE W WO CONTRAST  Result Date: 11/16/2020 CLINICAL DATA:  Non-small cell lung cancer, metastatic, assess treatment response. Mid back pain. EXAM: MRI THORACIC WITHOUT AND WITH CONTRAST TECHNIQUE: Multiplanar and multiecho pulse sequences  of the thoracic spine were obtained without and with intravenous contrast. CONTRAST:  36m GADAVIST GADOBUTROL 1 MMOL/ML IV SOLN COMPARISON:  Chest CTA 10/18/2020 FINDINGS: The study is motion degraded including moderate motion on axial T2 sequences. Alignment: Normal. Vertebrae: No fracture or or significant marrow edema. Mildly heterogeneous bone marrow signal diffusely with scattered small vertebral hemangiomas. No suspicious enhancing marrow lesion. Cord: No cord signal abnormality or abnormal enhancement is identified within limitations of motion artifact. Paraspinal and other soft tissues: Trace left pleural effusion. Known left lower lobe mass. Disc levels: Mild thoracic spondylosis without evidence of a compressive stenosis. IMPRESSION: Motion degraded examination. No evidence of metastatic disease in the thoracic spine. Electronically Signed   By: ALogan BoresM.D.   On: 11/16/2020 19:58   IR IMAGING GUIDED PORT INSERTION  Result Date: 11/12/2020 INDICATION: 60year old with stage IV lung cancer. Port-A-Cath needed for treatment. EXAM: FLUOROSCOPIC AND ULTRASOUND GUIDED PLACEMENT OF A SUBCUTANEOUS PORT COMPARISON:  None. MEDICATIONS: Ancef 2 g ANESTHESIA/SEDATION: Versed 2.0 mg IV; Fentanyl 100 mcg IV; Moderate Sedation Time:  53 minutes The patient was continuously monitored during the procedure by the interventional radiology nurse under my direct supervision. FLUOROSCOPY TIME:  42 seconds, 11 mGy COMPLICATIONS: None immediate. PROCEDURE: The procedure, risks, benefits, and alternatives were explained to the patient. Questions regarding the procedure were encouraged and answered. The patient understands and consents to the procedure. Patient was placed supine on the interventional table. Ultrasound confirmed a patent right internal jugular vein. Ultrasound image was saved for documentation. The right chest and neck were cleaned with a skin antiseptic and a sterile drape was placed. Maximal barrier  sterile technique was utilized including caps, mask, sterile gowns, sterile gloves, sterile drape, hand hygiene and skin antiseptic. The right neck was anesthetized with 1% lidocaine. Small incision was made in the right neck with a blade. Micropuncture set was placed in the right internal jugular vein with ultrasound guidance. The micropuncture wire was used for measurement purposes. The right chest was anesthetized with 1% lidocaine with epinephrine. #15 blade was used to make an incision and a subcutaneous port pocket was formed. [place]. Subcutaneous tunnel was formed with a stiff tunneling device. The port catheter was brought through the subcutaneous tunnel. The port was placed in the subcutaneous pocket. The micropuncture set was exchanged for a peel-away sheath. The catheter was placed through the peel-away sheath and the tip was positioned at the superior cavoatrial junction. Catheter placement was confirmed with fluoroscopy. The port was accessed and flushed with heparinized saline. The port pocket was closed using two layers of absorbable sutures and Dermabond. The vein skin site was closed using a single layer of absorbable suture and Dermabond. Sterile  dressings were applied. Patient tolerated the procedure well without an immediate complication. Ultrasound and fluoroscopic images were taken and saved for this procedure. IMPRESSION: Placement of a subcutaneous power-injectable port device. Catheter tip at the superior cavoatrial junction. Electronically Signed   By: Markus Daft M.D.   On: 11/12/2020 16:33    ASSESSMENT AND PLAN: This is a very pleasant 60 years old African-American female diagnosed with Stage IV (T2a, N2, M1b) non-small cell lung cancer, adenocarcinoma diagnosed in May 2022 and presented with left lower lobe lung mass in addition to left hilar and mediastinal lymphadenopathy as well as bilateral pulmonary nodules and metastatic disease to the ribs  bilaterally. 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 The patient is currently undergoing systemic chemotherapy with carboplatin for AUC of 5, Alimta 500 Mg/M2 and Keytruda 200 Mg IV every 3 weeks status post 2 cycle.  First cycle of her treatment was started at [facility] in [place]. The patient tolerated the second cycle of her chemotherapy fairly well with no concerning adverse effects. She had MRI of the brain as well as the thoracic spine performed recently and that showed no concerning findings for disease metastasis. I recommended for the patient to proceed with cycle #3 today as planned. I will see her back for follow-up visit in 3 weeks for evaluation with repeat CT scan of the chest, abdomen pelvis for restaging of her disease before starting cycle #4. The patient was advised to call immediately if she has any concerning symptoms in the interval. The patient voices understanding of current disease status and treatment options and is in agreement with the current care plan.  All questions were answered. The patient knows to call the clinic with any problems, questions or concerns. We can certainly see the patient much sooner if necessary.   Disclaimer: This note was dictated with voice recognition software. Similar sounding words can inadvertently be transcribed and may not be corrected upon review.

## 2020-11-24 DIAGNOSIS — I5033 Acute on chronic diastolic (congestive) heart failure: Secondary | ICD-10-CM | POA: Diagnosis not present

## 2020-11-26 ENCOUNTER — Other Ambulatory Visit (HOSPITAL_COMMUNITY): Payer: BC Managed Care – PPO

## 2020-11-26 DIAGNOSIS — J9611 Chronic respiratory failure with hypoxia: Secondary | ICD-10-CM | POA: Diagnosis not present

## 2020-11-26 DIAGNOSIS — J449 Chronic obstructive pulmonary disease, unspecified: Secondary | ICD-10-CM | POA: Diagnosis not present

## 2020-11-26 DIAGNOSIS — G4733 Obstructive sleep apnea (adult) (pediatric): Secondary | ICD-10-CM | POA: Diagnosis not present

## 2020-11-26 DIAGNOSIS — C3490 Malignant neoplasm of unspecified part of unspecified bronchus or lung: Secondary | ICD-10-CM | POA: Diagnosis not present

## 2020-11-30 ENCOUNTER — Inpatient Hospital Stay: Payer: BC Managed Care – PPO

## 2020-11-30 ENCOUNTER — Other Ambulatory Visit: Payer: Self-pay

## 2020-11-30 DIAGNOSIS — J449 Chronic obstructive pulmonary disease, unspecified: Secondary | ICD-10-CM | POA: Diagnosis not present

## 2020-11-30 DIAGNOSIS — Z5112 Encounter for antineoplastic immunotherapy: Secondary | ICD-10-CM | POA: Diagnosis not present

## 2020-11-30 DIAGNOSIS — C3492 Malignant neoplasm of unspecified part of left bronchus or lung: Secondary | ICD-10-CM | POA: Diagnosis not present

## 2020-11-30 DIAGNOSIS — I110 Hypertensive heart disease with heart failure: Secondary | ICD-10-CM | POA: Diagnosis not present

## 2020-11-30 DIAGNOSIS — Z452 Encounter for adjustment and management of vascular access device: Secondary | ICD-10-CM | POA: Diagnosis not present

## 2020-11-30 DIAGNOSIS — C7951 Secondary malignant neoplasm of bone: Secondary | ICD-10-CM | POA: Diagnosis not present

## 2020-11-30 DIAGNOSIS — E538 Deficiency of other specified B group vitamins: Secondary | ICD-10-CM | POA: Diagnosis not present

## 2020-11-30 DIAGNOSIS — I509 Heart failure, unspecified: Secondary | ICD-10-CM | POA: Diagnosis not present

## 2020-11-30 DIAGNOSIS — Z5111 Encounter for antineoplastic chemotherapy: Secondary | ICD-10-CM | POA: Diagnosis not present

## 2020-11-30 DIAGNOSIS — Z87891 Personal history of nicotine dependence: Secondary | ICD-10-CM | POA: Diagnosis not present

## 2020-11-30 DIAGNOSIS — Z79899 Other long term (current) drug therapy: Secondary | ICD-10-CM | POA: Diagnosis not present

## 2020-11-30 DIAGNOSIS — Z95828 Presence of other vascular implants and grafts: Secondary | ICD-10-CM

## 2020-11-30 LAB — CBC WITH DIFFERENTIAL (CANCER CENTER ONLY)
Abs Immature Granulocytes: 0.01 10*3/uL (ref 0.00–0.07)
Basophils Absolute: 0 10*3/uL (ref 0.0–0.1)
Basophils Relative: 1 %
Eosinophils Absolute: 0.1 10*3/uL (ref 0.0–0.5)
Eosinophils Relative: 4 %
HCT: 34.3 % — ABNORMAL LOW (ref 36.0–46.0)
Hemoglobin: 11.2 g/dL — ABNORMAL LOW (ref 12.0–15.0)
Immature Granulocytes: 0 %
Lymphocytes Relative: 49 %
Lymphs Abs: 1.2 10*3/uL (ref 0.7–4.0)
MCH: 30 pg (ref 26.0–34.0)
MCHC: 32.7 g/dL (ref 30.0–36.0)
MCV: 92 fL (ref 80.0–100.0)
Monocytes Absolute: 0.3 10*3/uL (ref 0.1–1.0)
Monocytes Relative: 12 %
Neutro Abs: 0.9 10*3/uL — ABNORMAL LOW (ref 1.7–7.7)
Neutrophils Relative %: 34 %
Platelet Count: 210 10*3/uL (ref 150–400)
RBC: 3.73 MIL/uL — ABNORMAL LOW (ref 3.87–5.11)
RDW: 14.8 % (ref 11.5–15.5)
WBC Count: 2.5 10*3/uL — ABNORMAL LOW (ref 4.0–10.5)
nRBC: 0 % (ref 0.0–0.2)

## 2020-11-30 LAB — CMP (CANCER CENTER ONLY)
ALT: 90 U/L — ABNORMAL HIGH (ref 0–44)
AST: 69 U/L — ABNORMAL HIGH (ref 15–41)
Albumin: 3.5 g/dL (ref 3.5–5.0)
Alkaline Phosphatase: 88 U/L (ref 38–126)
Anion gap: 12 (ref 5–15)
BUN: 15 mg/dL (ref 8–23)
CO2: 30 mmol/L (ref 22–32)
Calcium: 9.5 mg/dL (ref 8.9–10.3)
Chloride: 99 mmol/L (ref 98–111)
Creatinine: 0.78 mg/dL (ref 0.44–1.00)
GFR, Estimated: >60 mL/min (ref >60)
Glucose, Bld: 98 mg/dL (ref 70–99)
Potassium: 3.9 mmol/L (ref 3.5–5.1)
Sodium: 141 mmol/L (ref 135–145)
Total Bilirubin: 0.7 mg/dL (ref 0.3–1.2)
Total Protein: 7 g/dL (ref 6.5–8.1)

## 2020-11-30 MED ORDER — HEPARIN SOD (PORK) LOCK FLUSH 100 UNIT/ML IV SOLN
500.0000 [IU] | Freq: Once | INTRAVENOUS | Status: AC
  Administered 2020-11-30: 500 [IU]

## 2020-11-30 MED ORDER — SODIUM CHLORIDE 0.9% FLUSH
10.0000 mL | Freq: Once | INTRAVENOUS | Status: AC
Start: 2020-11-30 — End: 2020-11-30
  Administered 2020-11-30: 10 mL

## 2020-12-01 IMAGING — DX DG CHEST 2V
2 series · 2 of 2 positions shown · non-contrast
Comparison: April 15, 2018

CLINICAL DATA: Pneumonia follow-up

EXAM:
CHEST - 2 VIEW

[chest pa]
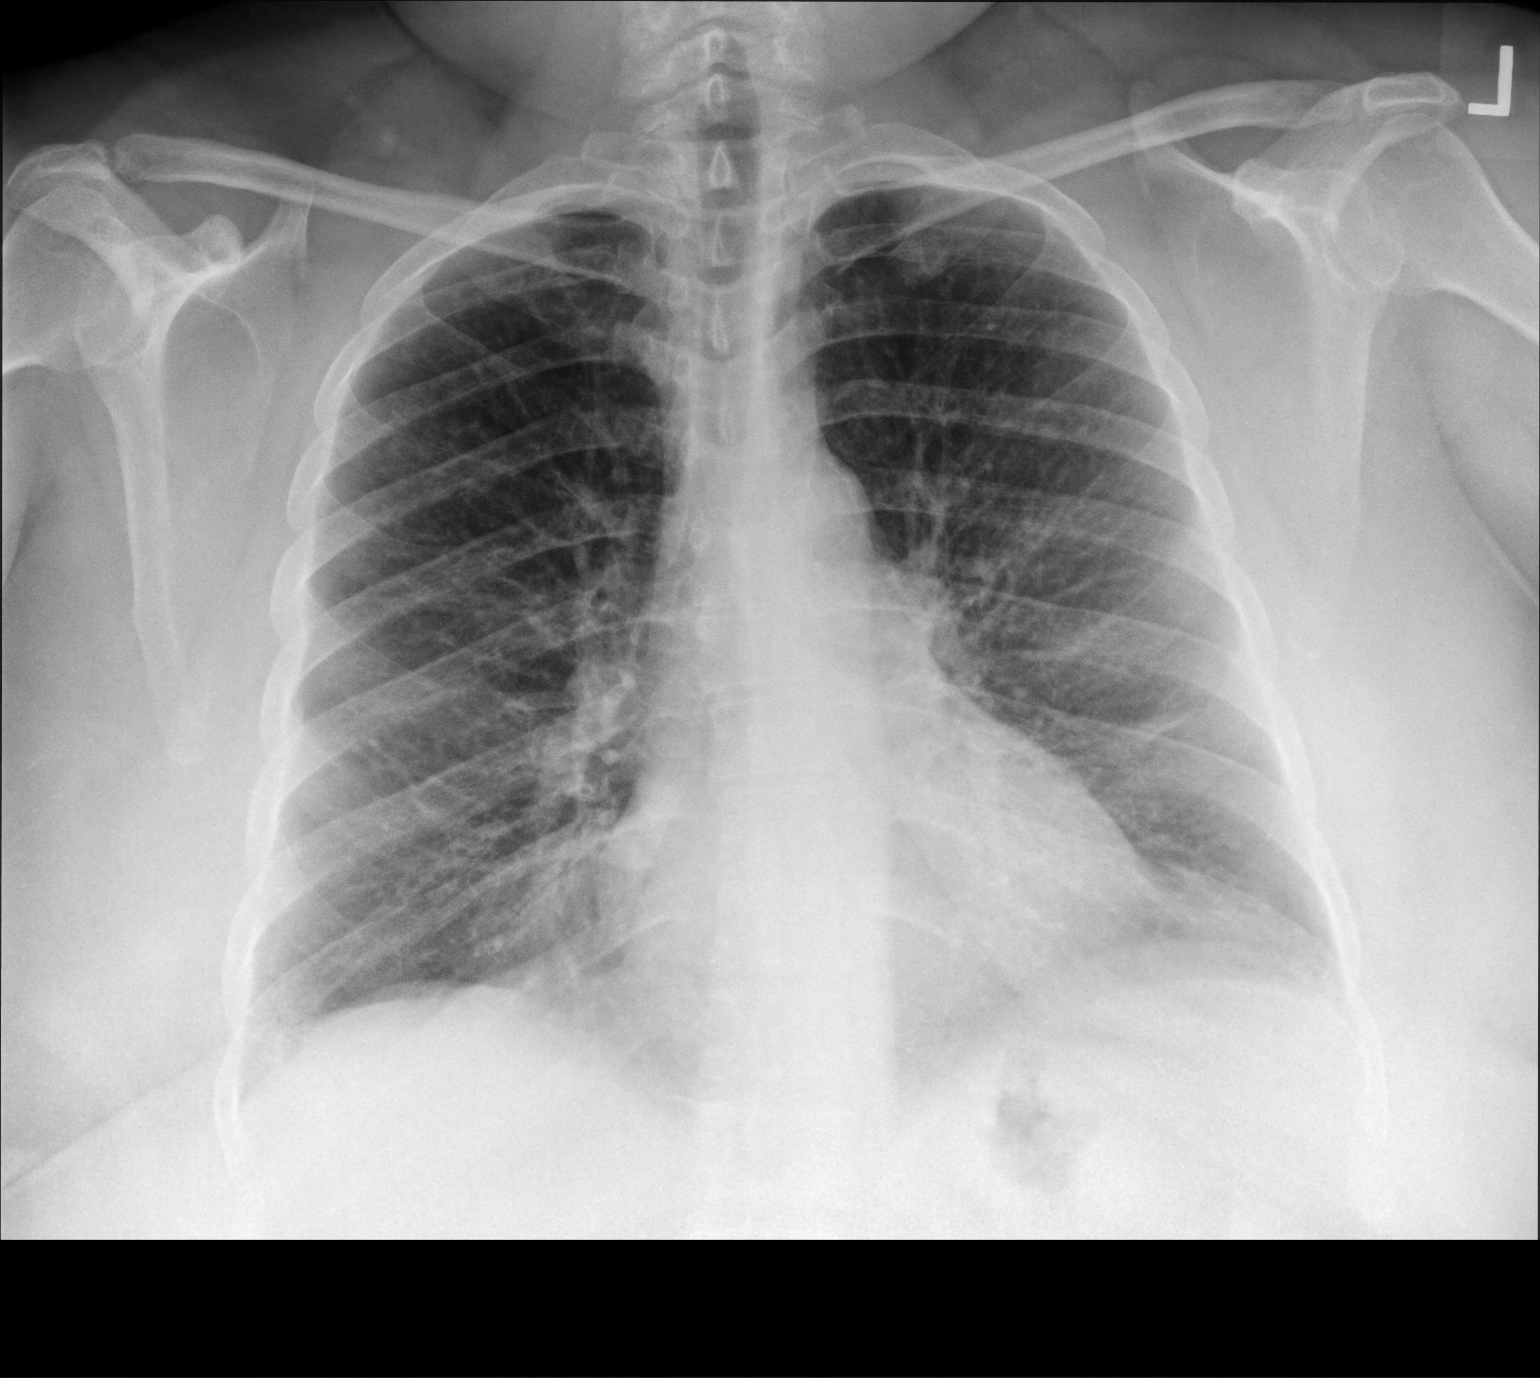

[chest lat]
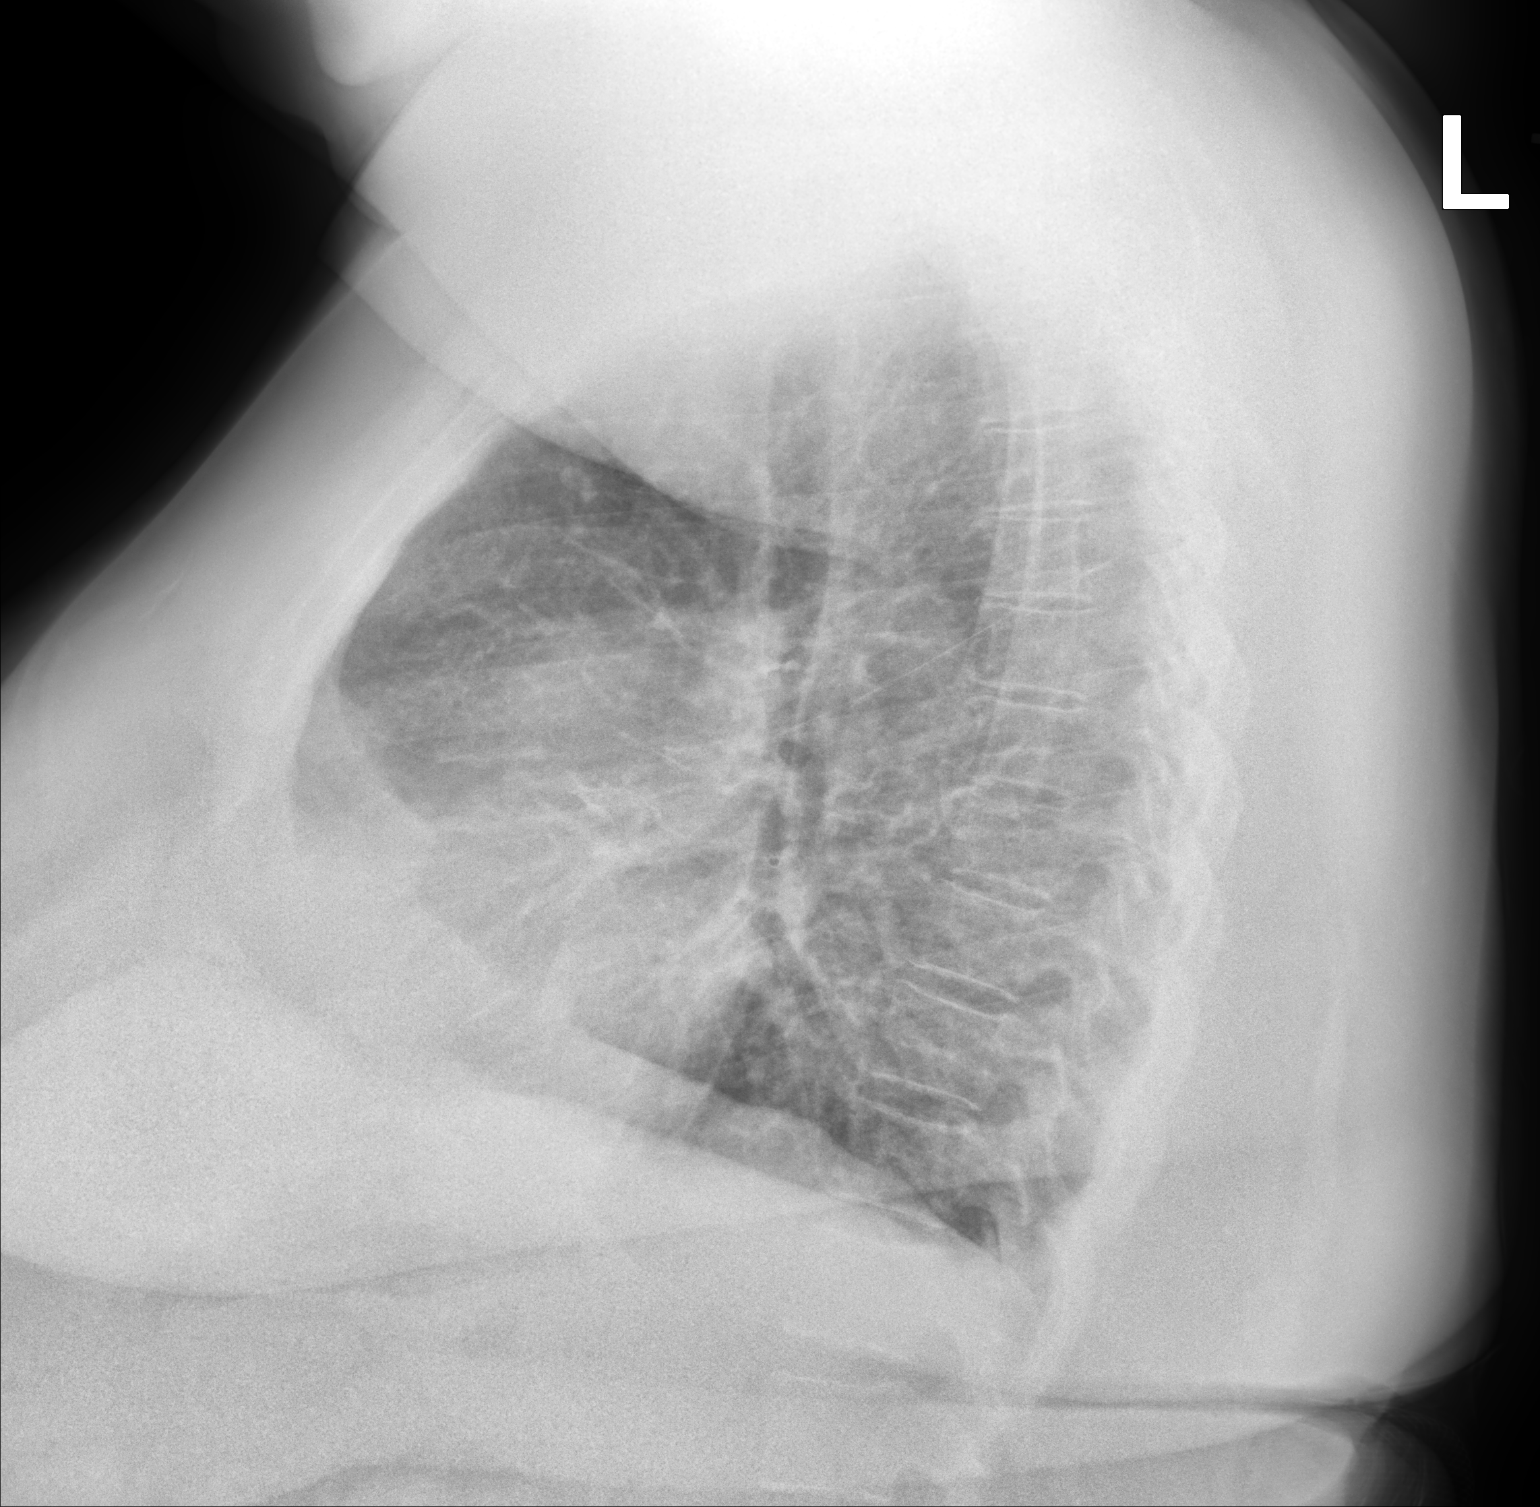

[2 of 2 positions shown; findings below may reference images not displayed]

FINDINGS: The cardiomediastinal silhouette is stable. No pneumothorax. The
infiltrate in the left base is nearly resolved. Minimal opacity
remaining likely represents atelectasis or scar.
IMPRESSION: Minimal opacity remains in the left base, likely atelectasis or
scar. There is been significant improvement in the interval.

## 2020-12-04 ENCOUNTER — Encounter: Payer: Self-pay | Admitting: *Deleted

## 2020-12-04 NOTE — Progress Notes (Signed)
Oncology Nurse Navigator Documentation  Oncology Nurse Navigator Flowsheets 12/04/2020  Abnormal Finding Date 07/24/2020  Confirmed Diagnosis Date 09/02/2020  Diagnosis Status Confirmed Diagnosis Complete  Planned Course of Treatment Chemotherapy;Targeted Therapy  Phase of Treatment Targeted Therapy  Chemotherapy Actual Start Date: 11/23/2020  Targeted Therapy Actual Start Date: 11/23/2020  Navigator Follow Up Date: 12/14/2020  Navigator Follow Up Reason: Follow-up Appointment  Navigator Location [facility]  Navigator Encounter Type Other:  Treatment Initiated Date 11/23/2020  Patient Visit Type Other  Treatment Phase Treatment  Barriers/Navigation Needs Coordination of Care  Interventions Coordination of Care  Acuity Level 2-Minimal Needs (1-2 Barriers Identified)  Coordination of Care Other  Time Spent with Patient 30

## 2020-12-07 ENCOUNTER — Inpatient Hospital Stay: Payer: BC Managed Care – PPO

## 2020-12-07 ENCOUNTER — Other Ambulatory Visit: Payer: Self-pay

## 2020-12-07 ENCOUNTER — Other Ambulatory Visit: Payer: BC Managed Care – PPO

## 2020-12-07 DIAGNOSIS — E538 Deficiency of other specified B group vitamins: Secondary | ICD-10-CM | POA: Diagnosis not present

## 2020-12-07 DIAGNOSIS — C7951 Secondary malignant neoplasm of bone: Secondary | ICD-10-CM | POA: Diagnosis not present

## 2020-12-07 DIAGNOSIS — Z5111 Encounter for antineoplastic chemotherapy: Secondary | ICD-10-CM | POA: Diagnosis not present

## 2020-12-07 DIAGNOSIS — Z87891 Personal history of nicotine dependence: Secondary | ICD-10-CM | POA: Diagnosis not present

## 2020-12-07 DIAGNOSIS — Z79899 Other long term (current) drug therapy: Secondary | ICD-10-CM | POA: Diagnosis not present

## 2020-12-07 DIAGNOSIS — I110 Hypertensive heart disease with heart failure: Secondary | ICD-10-CM | POA: Diagnosis not present

## 2020-12-07 DIAGNOSIS — J449 Chronic obstructive pulmonary disease, unspecified: Secondary | ICD-10-CM | POA: Diagnosis not present

## 2020-12-07 DIAGNOSIS — C3492 Malignant neoplasm of unspecified part of left bronchus or lung: Secondary | ICD-10-CM

## 2020-12-07 DIAGNOSIS — Z452 Encounter for adjustment and management of vascular access device: Secondary | ICD-10-CM | POA: Diagnosis not present

## 2020-12-07 DIAGNOSIS — I509 Heart failure, unspecified: Secondary | ICD-10-CM | POA: Diagnosis not present

## 2020-12-07 DIAGNOSIS — Z95828 Presence of other vascular implants and grafts: Secondary | ICD-10-CM

## 2020-12-07 DIAGNOSIS — Z5112 Encounter for antineoplastic immunotherapy: Secondary | ICD-10-CM | POA: Diagnosis not present

## 2020-12-07 LAB — CMP (CANCER CENTER ONLY)
ALT: 48 U/L — ABNORMAL HIGH (ref 0–44)
AST: 35 U/L (ref 15–41)
Albumin: 3.5 g/dL (ref 3.5–5.0)
Alkaline Phosphatase: 94 U/L (ref 38–126)
Anion gap: 12 (ref 5–15)
BUN: 11 mg/dL (ref 8–23)
CO2: 30 mmol/L (ref 22–32)
Calcium: 9.6 mg/dL (ref 8.9–10.3)
Chloride: 101 mmol/L (ref 98–111)
Creatinine: 0.82 mg/dL (ref 0.44–1.00)
GFR, Estimated: >60 mL/min (ref >60)
Glucose, Bld: 98 mg/dL (ref 70–99)
Potassium: 3.9 mmol/L (ref 3.5–5.1)
Sodium: 143 mmol/L (ref 135–145)
Total Bilirubin: 0.3 mg/dL (ref 0.3–1.2)
Total Protein: 7.1 g/dL (ref 6.5–8.1)

## 2020-12-07 LAB — CBC WITH DIFFERENTIAL (CANCER CENTER ONLY)
Abs Immature Granulocytes: 0.01 10*3/uL (ref 0.00–0.07)
Basophils Absolute: 0 10*3/uL (ref 0.0–0.1)
Basophils Relative: 0 %
Eosinophils Absolute: 0.1 10*3/uL (ref 0.0–0.5)
Eosinophils Relative: 3 %
HCT: 34.2 % — ABNORMAL LOW (ref 36.0–46.0)
Hemoglobin: 11.1 g/dL — ABNORMAL LOW (ref 12.0–15.0)
Immature Granulocytes: 0 %
Lymphocytes Relative: 38 %
Lymphs Abs: 1.8 10*3/uL (ref 0.7–4.0)
MCH: 30.1 pg (ref 26.0–34.0)
MCHC: 32.5 g/dL (ref 30.0–36.0)
MCV: 92.7 fL (ref 80.0–100.0)
Monocytes Absolute: 1 10*3/uL (ref 0.1–1.0)
Monocytes Relative: 22 %
Neutro Abs: 1.7 10*3/uL (ref 1.7–7.7)
Neutrophils Relative %: 37 %
Platelet Count: 113 10*3/uL — ABNORMAL LOW (ref 150–400)
RBC: 3.69 MIL/uL — ABNORMAL LOW (ref 3.87–5.11)
RDW: 14.9 % (ref 11.5–15.5)
WBC Count: 4.6 10*3/uL (ref 4.0–10.5)
nRBC: 0.7 % — ABNORMAL HIGH (ref 0.0–0.2)

## 2020-12-07 LAB — TSH: TSH: 0.78 u[IU]/mL (ref 0.308–3.960)

## 2020-12-07 MED ORDER — SODIUM CHLORIDE 0.9% FLUSH
10.0000 mL | Freq: Once | INTRAVENOUS | Status: AC
  Administered 2020-12-07: 10 mL

## 2020-12-07 MED ORDER — HEPARIN SOD (PORK) LOCK FLUSH 100 UNIT/ML IV SOLN
500.0000 [IU] | Freq: Once | INTRAVENOUS | Status: AC
  Administered 2020-12-07: 500 [IU]

## 2020-12-11 ENCOUNTER — Other Ambulatory Visit: Payer: Self-pay

## 2020-12-11 ENCOUNTER — Ambulatory Visit (HOSPITAL_COMMUNITY)
Admission: RE | Admit: 2020-12-11 | Discharge: 2020-12-11 | Disposition: A | Discharge status: Home / Self Care | Payer: BC Managed Care – PPO | Source: Ambulatory Visit | Attending: Internal Medicine | Admitting: Internal Medicine

## 2020-12-11 DIAGNOSIS — C3490 Malignant neoplasm of unspecified part of unspecified bronchus or lung: Secondary | ICD-10-CM

## 2020-12-11 DIAGNOSIS — R918 Other nonspecific abnormal finding of lung field: Secondary | ICD-10-CM | POA: Diagnosis not present

## 2020-12-11 DIAGNOSIS — I700 Atherosclerosis of aorta: Secondary | ICD-10-CM | POA: Diagnosis not present

## 2020-12-11 DIAGNOSIS — K760 Fatty (change of) liver, not elsewhere classified: Secondary | ICD-10-CM | POA: Diagnosis not present

## 2020-12-11 DIAGNOSIS — R911 Solitary pulmonary nodule: Secondary | ICD-10-CM | POA: Diagnosis not present

## 2020-12-11 DIAGNOSIS — J439 Emphysema, unspecified: Secondary | ICD-10-CM | POA: Diagnosis not present

## 2020-12-11 MED ORDER — HEPARIN SOD (PORK) LOCK FLUSH 100 UNIT/ML IV SOLN
500.0000 [IU] | Freq: Once | INTRAVENOUS | Status: AC
  Administered 2020-12-11: 500 [IU] via INTRAVENOUS

## 2020-12-11 MED ORDER — HEPARIN SOD (PORK) LOCK FLUSH 100 UNIT/ML IV SOLN
INTRAVENOUS | Status: AC
  Filled 2020-12-11: qty 5

## 2020-12-11 MED ORDER — IOHEXOL 350 MG/ML SOLN
100.0000 mL | Freq: Once | INTRAVENOUS | Status: AC | PRN
  Administered 2020-12-11: 100 mL via INTRAVENOUS

## 2020-12-14 ENCOUNTER — Inpatient Hospital Stay: Payer: BC Managed Care – PPO

## 2020-12-14 ENCOUNTER — Other Ambulatory Visit: Payer: Self-pay

## 2020-12-14 ENCOUNTER — Inpatient Hospital Stay (HOSPITAL_BASED_OUTPATIENT_CLINIC_OR_DEPARTMENT_OTHER): Payer: BC Managed Care – PPO | Admitting: Internal Medicine

## 2020-12-14 ENCOUNTER — Encounter: Payer: Self-pay | Admitting: Internal Medicine

## 2020-12-14 ENCOUNTER — Other Ambulatory Visit: Payer: BC Managed Care – PPO

## 2020-12-14 VITALS — BP 160/84 | HR 88 | Temp 97.4°F | Resp 20 | Ht <= 58 in | Wt 266.5 lb

## 2020-12-14 DIAGNOSIS — Z87891 Personal history of nicotine dependence: Secondary | ICD-10-CM | POA: Diagnosis not present

## 2020-12-14 DIAGNOSIS — C3492 Malignant neoplasm of unspecified part of left bronchus or lung: Secondary | ICD-10-CM

## 2020-12-14 DIAGNOSIS — E538 Deficiency of other specified B group vitamins: Secondary | ICD-10-CM | POA: Diagnosis not present

## 2020-12-14 DIAGNOSIS — Z452 Encounter for adjustment and management of vascular access device: Secondary | ICD-10-CM | POA: Diagnosis not present

## 2020-12-14 DIAGNOSIS — Z9981 Dependence on supplemental oxygen: Secondary | ICD-10-CM | POA: Diagnosis not present

## 2020-12-14 DIAGNOSIS — C7951 Secondary malignant neoplasm of bone: Secondary | ICD-10-CM | POA: Diagnosis not present

## 2020-12-14 DIAGNOSIS — Z79899 Other long term (current) drug therapy: Secondary | ICD-10-CM | POA: Diagnosis not present

## 2020-12-14 DIAGNOSIS — I509 Heart failure, unspecified: Secondary | ICD-10-CM | POA: Diagnosis not present

## 2020-12-14 DIAGNOSIS — Z5111 Encounter for antineoplastic chemotherapy: Secondary | ICD-10-CM

## 2020-12-14 DIAGNOSIS — Z5112 Encounter for antineoplastic immunotherapy: Secondary | ICD-10-CM | POA: Diagnosis not present

## 2020-12-14 DIAGNOSIS — Z95828 Presence of other vascular implants and grafts: Secondary | ICD-10-CM

## 2020-12-14 DIAGNOSIS — J449 Chronic obstructive pulmonary disease, unspecified: Secondary | ICD-10-CM | POA: Diagnosis not present

## 2020-12-14 DIAGNOSIS — I110 Hypertensive heart disease with heart failure: Secondary | ICD-10-CM | POA: Diagnosis not present

## 2020-12-14 LAB — CBC WITH DIFFERENTIAL (CANCER CENTER ONLY)
Abs Immature Granulocytes: 0.02 10*3/uL (ref 0.00–0.07)
Basophils Absolute: 0 10*3/uL (ref 0.0–0.1)
Basophils Relative: 0 %
Eosinophils Absolute: 0.1 10*3/uL (ref 0.0–0.5)
Eosinophils Relative: 2 %
HCT: 34.6 % — ABNORMAL LOW (ref 36.0–46.0)
Hemoglobin: 11.3 g/dL — ABNORMAL LOW (ref 12.0–15.0)
Immature Granulocytes: 1 %
Lymphocytes Relative: 38 %
Lymphs Abs: 1.5 10*3/uL (ref 0.7–4.0)
MCH: 31.3 pg (ref 26.0–34.0)
MCHC: 32.7 g/dL (ref 30.0–36.0)
MCV: 95.8 fL (ref 80.0–100.0)
Monocytes Absolute: 0.7 10*3/uL (ref 0.1–1.0)
Monocytes Relative: 16 %
Neutro Abs: 1.7 10*3/uL (ref 1.7–7.7)
Neutrophils Relative %: 43 %
Platelet Count: 260 10*3/uL (ref 150–400)
RBC: 3.61 MIL/uL — ABNORMAL LOW (ref 3.87–5.11)
RDW: 18.3 % — ABNORMAL HIGH (ref 11.5–15.5)
WBC Count: 4 10*3/uL (ref 4.0–10.5)
nRBC: 0.5 % — ABNORMAL HIGH (ref 0.0–0.2)

## 2020-12-14 LAB — CMP (CANCER CENTER ONLY)
ALT: 45 U/L — ABNORMAL HIGH (ref 0–44)
AST: 40 U/L (ref 15–41)
Albumin: 3.5 g/dL (ref 3.5–5.0)
Alkaline Phosphatase: 82 U/L (ref 38–126)
Anion gap: 11 (ref 5–15)
BUN: 10 mg/dL (ref 8–23)
CO2: 28 mmol/L (ref 22–32)
Calcium: 9.4 mg/dL (ref 8.9–10.3)
Chloride: 104 mmol/L (ref 98–111)
Creatinine: 0.83 mg/dL (ref 0.44–1.00)
GFR, Estimated: >60 mL/min (ref >60)
Glucose, Bld: 101 mg/dL — ABNORMAL HIGH (ref 70–99)
Potassium: 3.9 mmol/L (ref 3.5–5.1)
Sodium: 143 mmol/L (ref 135–145)
Total Bilirubin: 0.3 mg/dL (ref 0.3–1.2)
Total Protein: 6.9 g/dL (ref 6.5–8.1)

## 2020-12-14 MED ORDER — SODIUM CHLORIDE 0.9% FLUSH
10.0000 mL | Freq: Once | INTRAVENOUS | Status: AC
  Administered 2020-12-14: 10 mL

## 2020-12-14 MED ORDER — HEPARIN SOD (PORK) LOCK FLUSH 100 UNIT/ML IV SOLN
500.0000 [IU] | Freq: Once | INTRAVENOUS | Status: AC | PRN
  Administered 2020-12-14: 500 [IU]

## 2020-12-14 MED ORDER — SODIUM CHLORIDE 0.9 % IV SOLN: Freq: Once | INTRAVENOUS | Status: AC

## 2020-12-14 MED ORDER — PALONOSETRON HCL INJECTION 0.25 MG/5ML
0.2500 mg | Freq: Once | INTRAVENOUS | Status: AC
  Administered 2020-12-14: 0.25 mg via INTRAVENOUS
  Filled 2020-12-14: qty 5

## 2020-12-14 MED ORDER — SODIUM CHLORIDE 0.9 % IV SOLN
150.0000 mg | Freq: Once | INTRAVENOUS | Status: AC
  Administered 2020-12-14: 150 mg via INTRAVENOUS
  Filled 2020-12-14: qty 150

## 2020-12-14 MED ORDER — SODIUM CHLORIDE 0.9% FLUSH
10.0000 mL | INTRAVENOUS | Status: DC | PRN
  Administered 2020-12-14: 10 mL

## 2020-12-14 MED ORDER — CYANOCOBALAMIN 1000 MCG/ML IJ SOLN
1000.0000 ug | Freq: Once | INTRAMUSCULAR | Status: AC
  Administered 2020-12-14: 1000 ug via INTRAMUSCULAR
  Filled 2020-12-14: qty 1

## 2020-12-14 MED ORDER — SODIUM CHLORIDE 0.9 % IV SOLN
750.0000 mg | Freq: Once | INTRAVENOUS | Status: AC
  Administered 2020-12-14: 750 mg via INTRAVENOUS
  Filled 2020-12-14: qty 75

## 2020-12-14 MED ORDER — SODIUM CHLORIDE 0.9 % IV SOLN
200.0000 mg | Freq: Once | INTRAVENOUS | Status: AC
  Administered 2020-12-14: 200 mg via INTRAVENOUS
  Filled 2020-12-14: qty 8

## 2020-12-14 MED ORDER — SODIUM CHLORIDE 0.9 % IV SOLN
500.0000 mg/m2 | Freq: Once | INTRAVENOUS | Status: AC
  Administered 2020-12-14: 1100 mg via INTRAVENOUS
  Filled 2020-12-14: qty 40

## 2020-12-14 MED ORDER — SODIUM CHLORIDE 0.9 % IV SOLN
10.0000 mg | Freq: Once | INTRAVENOUS | Status: AC
  Administered 2020-12-14: 10 mg via INTRAVENOUS
  Filled 2020-12-14: qty 10

## 2020-12-14 NOTE — Progress Notes (Signed)
[place] [phone]   [phone]  OFFICE PROGRESS NOTE  Koirala, Dibas, MD [address] [place] [address]  DIAGNOSIS: Stage IV (T2a, N2, M1b) non-small cell lung cancer, adenocarcinoma diagnosed in May 2022 and presented with left lower lobe lung mass in addition to left hilar and mediastinal lymphadenopathy as well as bilateral pulmonary nodules and metastatic disease to the ribs bilaterally. 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  PRIOR THERAPY: None  CURRENT THERAPY: Systemic chemotherapy with carboplatin for AUC of 5, Alimta 500 Mg/M2 and Keytruda 200 Mg IV every 3 weeks.  Started at [facility] on 10/13/2020. Status post 3 cycles.  INTERVAL HISTORY: Kaisley Stiverson 60 y.o. female returns to the clinic today for follow-up visit accompanied by her daughter.  The patient is feeling fine today with no concerning complaints except for the baseline shortness of breath and she is currently on home oxygen.  She denied having any current chest pain, cough or hemoptysis.  She denied having any fever or chills.  She has no nausea, vomiting, diarrhea or constipation.  She has no headache or visual changes.  She has been tolerating her treatment with systemic chemotherapy fairly well.  The patient had repeat CT scan of the chest, abdomen pelvis performed recently and she is here for evaluation and discussion of her risk her results.   MEDICAL HISTORY: Past Medical History:  Diagnosis Date   Cancer [facility])    CHF (congestive heart failure) (HCC)    COPD (chronic obstructive pulmonary disease) ([place])    Family history of lung cancer 10/29/2020   Hypertension    Pneumonia 04/17/2018    ALLERGIES:  has No Known Allergies.  MEDICATIONS:  Current Outpatient Medications  Medication Sig Dispense Refill   acetaminophen (TYLENOL) 500 MG tablet Take 2 tablets (1,000 mg  total) by mouth every 8 (eight) hours. 30 tablet 0   budesonide (PULMICORT) 0.5 MG/2ML nebulizer solution Take 0.5 mg by nebulization 2 (two) times daily as needed (Asthma).     carvedilol (COREG) 12.5 MG tablet Take 12.5 mg by mouth 2 (two) times daily with a meal.     Cholecalciferol (VITAMIN D3 PO) Take 1 capsule by mouth daily.     Cyanocobalamin (VITAMIN B-12 PO) Take 1 tablet by mouth daily.     folic acid (FOLVITE) 1 MG tablet Take 1 tablet (1 mg total) by mouth See admin instructions. Take 1 mg daily by mouth (Patient taking differently: Take 1 mg by mouth daily. Take 1 mg daily by mouth) 30 tablet 2   hydrochlorothiazide (HYDRODIURIL) 25 MG tablet Take 25 mg by mouth daily.     ipratropium-albuterol (DUONEB) 0.5-2.5 (3) MG/3ML SOLN Take 3 mLs by nebulization every 6 (six) hours as needed. (Patient taking differently: Take 3 mLs by nebulization every 6 (six) hours as needed (Asthma).) 360 mL 1   lidocaine (LIDODERM) 5 % Place 1 patch onto the skin daily. Remove & Discard patch within 12 hours or as directed by MD 30 patch 0   lidocaine-prilocaine (EMLA) cream Apply 1 application topically as needed (for port access). 30 g 0   LORazepam (ATIVAN) 0.5 MG tablet One tab po 30 min before MRI. Repeat once if needed. (Patient not taking: Reported on 11/23/2020) 2 tablet 0   metFORMIN (GLUCOPHAGE-XR) 500 MG 24 hr tablet Take 500 mg by mouth daily with breakfast.  Multiple Vitamins-Minerals (ONE-A-DAY WOMENS PO) Take 1 tablet by mouth daily with breakfast.     oxyCODONE (OXY IR/ROXICODONE) 5 MG immediate release tablet Take 1 tablet (5 mg total) by mouth every 8 (eight) hours as needed for moderate pain. (Patient not taking: No sig reported) 20 tablet 0   OXYGEN Inhale 3 L/min into the lungs continuous.     prochlorperazine (COMPAZINE) 10 MG tablet Take 10 mg by mouth every 6 (six) hours as needed for nausea or vomiting. (Patient not taking: Reported on 11/23/2020)     Tiotropium Bromide-Olodaterol  (STIOLTO RESPIMAT) 2.5-2.5 MCG/ACT AERS Inhale 2 puffs into the lungs daily. 1 Inhaler 0   No current facility-administered medications for this visit.    SURGICAL HISTORY:  Past Surgical History:  Procedure Laterality Date   CESAREAN SECTION  1979; 1981; 1992   IR IMAGING GUIDED PORT INSERTION  11/12/2020   TUBAL LIGATION  1992    REVIEW OF SYSTEMS:  Constitutional: positive for fatigue Eyes: negative Ears, nose, mouth, throat, and face: negative Respiratory: positive for dyspnea on exertion Cardiovascular: negative Gastrointestinal: negative Genitourinary:negative Integument/breast: negative Hematologic/lymphatic: negative Musculoskeletal:negative Neurological: negative Behavioral/Psych: negative Endocrine: negative Allergic/Immunologic: negative   PHYSICAL EXAMINATION: General appearance: alert, cooperative, fatigued, and no distress Head: Normocephalic, without obvious abnormality, atraumatic Neck: no adenopathy, no JVD, supple, symmetrical, trachea midline, and thyroid not enlarged, symmetric, no tenderness/mass/nodules Lymph nodes: Cervical, supraclavicular, and axillary nodes normal. Resp: clear to auscultation bilaterally Back: symmetric, no curvature. ROM normal. No CVA tenderness. Cardio: regular rate and rhythm, S1, S2 normal, no murmur, click, rub or gallop GI: soft, non-tender; bowel sounds normal; no masses,  no organomegaly Extremities: extremities normal, atraumatic, no cyanosis or edema Neurologic: Alert and oriented X 3, normal strength and tone. Normal symmetric reflexes. Normal coordination and gait  ECOG PERFORMANCE STATUS: 1 - Symptomatic but completely ambulatory  Blood pressure (!) 160/84, pulse 88, temperature (!) 97.4 F (36.3 C), temperature source Tympanic, resp. rate 20, height $RemoveBe'4\' 10"'sohFMavin$  (1.473 m), weight 266 lb 8 oz (120.9 kg), SpO2 97 %.  LABORATORY DATA: Lab Results  Component Value Date   WBC 4.0 12/14/2020   HGB 11.3 (L) 12/14/2020   HCT  34.6 (L) 12/14/2020   MCV 95.8 12/14/2020   PLT 260 12/14/2020      Chemistry      Component Value Date/Time   NA 143 12/07/2020 0937   K 3.9 12/07/2020 0937   CL 101 12/07/2020 0937   CO2 30 12/07/2020 0937   BUN 11 12/07/2020 0937   CREATININE 0.82 12/07/2020 0937      Component Value Date/Time   CALCIUM 9.6 12/07/2020 0937   ALKPHOS 94 12/07/2020 0937   AST 35 12/07/2020 0937   ALT 48 (H) 12/07/2020 0937   BILITOT 0.3 12/07/2020 7867       RADIOGRAPHIC STUDIES: CT Chest W Contrast  Result Date: 12/11/2020 CLINICAL DATA:  Primary Cancer Type: Lung Imaging Indication: Assess response to therapy Interval therapy since last imaging? Yes Initial Cancer Diagnosis Date: 09/02/2020; Established by: Biopsy-proven Detailed Pathology: Stage IV non-small cell lung cancer, adenocarcinoma. Primary Tumor location: Left lower lobe. Metastatic disease to the ribs. Surgeries: No. Chemotherapy: Yes; Ongoing? Yes; Most recent administration: 11/23/2020 Immunotherapy?  Yes; Type: Keytruda; Ongoing? Yes Radiation therapy? No EXAM: CT CHEST, ABDOMEN, AND PELVIS WITH CONTRAST TECHNIQUE: Multidetector CT imaging of the chest, abdomen and pelvis was performed following the standard protocol during bolus administration of intravenous contrast. CONTRAST:  173mL OMNIPAQUE IOHEXOL 350 MG/ML SOLN, additional  oral enteric contrast COMPARISON:  CT chest angiogram, 10/18/2020, outside PET-CT, 07/24/2020 FINDINGS: CT CHEST FINDINGS Cardiovascular: Right chest port catheter. Aortic atherosclerosis. Normal heart size. No pericardial effusion. Mediastinum/Nodes: No enlarged mediastinal, hilar, or axillary lymph nodes. Thyroid gland, trachea, and esophagus demonstrate no significant findings. Lungs/Pleura: Moderate centrilobular emphysema. Diffuse bilateral bronchial wall thickening. There has been nearly complete resolution of a previously seen subpleural mass of the medial left lower lobe overlying the aorta, now  measuring no greater than 1.0 x 0.5 cm, previously 3.6 x 3.0 cm when measured similarly (series 4, image 73). Interval decrease in size of a left apical pulmonary nodule, measuring 1.2 x 0.9 cm, previously 1.4 x 1.4 cm (series 4, image 26). Interval decrease in size of a pulmonary nodule of the left upper lobe, measuring 0.5 cm, previously 0.8 cm (series 4, image 43). Interval decrease in size of a small nodule of the anterior right middle lobe, and measuring no greater than 0.3 cm, previously 0.4 cm (series 4, image 90). Other small nodules are completely resolved, for example a previously noted nodules in the dependent right lower lobe (series 4, image 96). Bandlike scarring of the anterior left lower lobe (series 4, image 86). No pleural effusion or pneumothorax. Musculoskeletal: No chest wall mass. Interval sclerosis of a previously seen lytic lesion of the left fifth rib (series 2, image 24), left eighth rib (series 2, image 41), right second rib (series 2, image 12), and right fourth rib (series 2, image 25). CT ABDOMEN PELVIS FINDINGS Hepatobiliary: No solid liver abnormality is seen. Hepatic steatosis. No gallstones, gallbladder wall thickening, or biliary dilatation. Pancreas: Unremarkable. No pancreatic ductal dilatation or surrounding inflammatory changes. Spleen: Normal in size. Evaluation of the spleen is somewhat limited by breath motion artifact and single phase of contrast, within this limitation, a hypodense lesion of the spleen is slightly diminished in size, measuring approximately 1.1 cm, previously 1.6 cm (series 2, image 53). Adrenals/Urinary Tract: Adrenal glands are unremarkable. Kidneys are normal, without renal calculi, solid lesion, or hydronephrosis. Bladder is unremarkable. Stomach/Bowel: Stomach is within normal limits. Appendix appears normal. No evidence of bowel wall thickening, distention, or inflammatory changes. Pancolonic diverticulosis. Vascular/Lymphatic: Aortic  atherosclerosis. Unchanged prominent bilateral iliac and inguinal lymph nodes, not previously FDG PET avid (series 2, image 89, 103). Reproductive: Uterine fibroids. Other: No abdominal wall hernia or abnormality. No abdominopelvic ascites. Musculoskeletal: No acute findings. Sclerotic lesion of the right pubic symphysis (series 2, image 102). IMPRESSION: 1. There has been nearly complete resolution of a previously seen subpleural mass of the medial left lower lobe overlying the aorta. 2. Interval decrease in size of multiple bilateral pulmonary nodules, in addition to complete resolution of other previously seen small nodules. 3. Interval post treatment sclerosis of multiple bilateral previously seen lytic rib lesions as well as a sclerotic lesion of the right pubic symphysis. 4. Evaluation of the spleen is somewhat limited by breath motion artifact and single phase of contrast, within this limitation, a hypodense lesion of the spleen appears to be slightly diminished in size, and may reflect an improved metastasis, although is incompletely characterized. 5. Overall constellation of findings is consistent with treatment response of primary lung malignancy and metastatic disease. Aortic Atherosclerosis (ICD10-I70.0). Electronically Signed   By: Eddie Candle M.D.   On: 12/11/2020 11:12   MR BRAIN W WO CONTRAST  Result Date: 11/16/2020 CLINICAL DATA:  Malignant neoplasm of unspecified part of unspecified bronchus or lung ([place]) C34.90 (ICD-10-CM). Non-small cell lung cancer, staging.  EXAM: MRI HEAD WITHOUT AND WITH CONTRAST TECHNIQUE: Multiplanar, multiecho pulse sequences of the brain and surrounding structures were obtained without and with intravenous contrast. CONTRAST:  50mL GADAVIST GADOBUTROL 1 MMOL/ML IV SOLN COMPARISON:  Head CT October 18, 2020 FINDINGS: Brain: No acute infarction, hemorrhage, hydrocephalus, extra-axial collection or mass lesion. Scattered and confluent foci of T2 hyperintensity are seen  within the white the cerebral hemispheres and within the pons, nonspecific, most likely related to small vessel ischemia. No focus of abnormal contrast enhancement. Enlarged, partial empty sella. Vascular: Normal flow voids. Skull and upper cervical spine: Reversal of the cervical curvature. No focal marrow lesion. Sinuses/Orbits: Bilateral proptosis.  Paranasal sinuses are clear Other: Trace bilateral mastoid effusion. IMPRESSION: 1. No evidence of intracranial metastatic disease. 2. Moderate amount of nonspecific T2 hyperintense lesions of the white matter, may be seen in the setting of chronic microangiopathy. 3. Enlarged, empty sella. May be seen in the setting of idiopathic intracranial hypertension in the appropriate clinical scenario. Electronically Signed   By: Pedro Earls M.D.   On: 11/16/2020 18:21   MR THORACIC SPINE W WO CONTRAST  Result Date: 11/16/2020 CLINICAL DATA:  Non-small cell lung cancer, metastatic, assess treatment response. Mid back pain. EXAM: MRI THORACIC WITHOUT AND WITH CONTRAST TECHNIQUE: Multiplanar and multiecho pulse sequences of the thoracic spine were obtained without and with intravenous contrast. CONTRAST:  79mL GADAVIST GADOBUTROL 1 MMOL/ML IV SOLN COMPARISON:  Chest CTA 10/18/2020 FINDINGS: The study is motion degraded including moderate motion on axial T2 sequences. Alignment: Normal. Vertebrae: No fracture or or significant marrow edema. Mildly heterogeneous bone marrow signal diffusely with scattered small vertebral hemangiomas. No suspicious enhancing marrow lesion. Cord: No cord signal abnormality or abnormal enhancement is identified within limitations of motion artifact. Paraspinal and other soft tissues: Trace left pleural effusion. Known left lower lobe mass. Disc levels: Mild thoracic spondylosis without evidence of a compressive stenosis. IMPRESSION: Motion degraded examination. No evidence of metastatic disease in the thoracic spine.  Electronically Signed   By: Logan Bores M.D.   On: 11/16/2020 19:58   CT Abdomen Pelvis W Contrast  Result Date: 12/11/2020 CLINICAL DATA:  Primary Cancer Type: Lung Imaging Indication: Assess response to therapy Interval therapy since last imaging? Yes Initial Cancer Diagnosis Date: 09/02/2020; Established by: Biopsy-proven Detailed Pathology: Stage IV non-small cell lung cancer, adenocarcinoma. Primary Tumor location: Left lower lobe. Metastatic disease to the ribs. Surgeries: No. Chemotherapy: Yes; Ongoing? Yes; Most recent administration: 11/23/2020 Immunotherapy?  Yes; Type: Keytruda; Ongoing? Yes Radiation therapy? No EXAM: CT CHEST, ABDOMEN, AND PELVIS WITH CONTRAST TECHNIQUE: Multidetector CT imaging of the chest, abdomen and pelvis was performed following the standard protocol during bolus administration of intravenous contrast. CONTRAST:  160mL OMNIPAQUE IOHEXOL 350 MG/ML SOLN, additional oral enteric contrast COMPARISON:  CT chest angiogram, 10/18/2020, outside PET-CT, 07/24/2020 FINDINGS: CT CHEST FINDINGS Cardiovascular: Right chest port catheter. Aortic atherosclerosis. Normal heart size. No pericardial effusion. Mediastinum/Nodes: No enlarged mediastinal, hilar, or axillary lymph nodes. Thyroid gland, trachea, and esophagus demonstrate no significant findings. Lungs/Pleura: Moderate centrilobular emphysema. Diffuse bilateral bronchial wall thickening. There has been nearly complete resolution of a previously seen subpleural mass of the medial left lower lobe overlying the aorta, now measuring no greater than 1.0 x 0.5 cm, previously 3.6 x 3.0 cm when measured similarly (series 4, image 73). Interval decrease in size of a left apical pulmonary nodule, measuring 1.2 x 0.9 cm, previously 1.4 x 1.4 cm (series 4, image 26). Interval decrease in size  of a pulmonary nodule of the left upper lobe, measuring 0.5 cm, previously 0.8 cm (series 4, image 43). Interval decrease in size of a small nodule of the  anterior right middle lobe, and measuring no greater than 0.3 cm, previously 0.4 cm (series 4, image 90). Other small nodules are completely resolved, for example a previously noted nodules in the dependent right lower lobe (series 4, image 96). Bandlike scarring of the anterior left lower lobe (series 4, image 86). No pleural effusion or pneumothorax. Musculoskeletal: No chest wall mass. Interval sclerosis of a previously seen lytic lesion of the left fifth rib (series 2, image 24), left eighth rib (series 2, image 41), right second rib (series 2, image 12), and right fourth rib (series 2, image 25). CT ABDOMEN PELVIS FINDINGS Hepatobiliary: No solid liver abnormality is seen. Hepatic steatosis. No gallstones, gallbladder wall thickening, or biliary dilatation. Pancreas: Unremarkable. No pancreatic ductal dilatation or surrounding inflammatory changes. Spleen: Normal in size. Evaluation of the spleen is somewhat limited by breath motion artifact and single phase of contrast, within this limitation, a hypodense lesion of the spleen is slightly diminished in size, measuring approximately 1.1 cm, previously 1.6 cm (series 2, image 53). Adrenals/Urinary Tract: Adrenal glands are unremarkable. Kidneys are normal, without renal calculi, solid lesion, or hydronephrosis. Bladder is unremarkable. Stomach/Bowel: Stomach is within normal limits. Appendix appears normal. No evidence of bowel wall thickening, distention, or inflammatory changes. Pancolonic diverticulosis. Vascular/Lymphatic: Aortic atherosclerosis. Unchanged prominent bilateral iliac and inguinal lymph nodes, not previously FDG PET avid (series 2, image 89, 103). Reproductive: Uterine fibroids. Other: No abdominal wall hernia or abnormality. No abdominopelvic ascites. Musculoskeletal: No acute findings. Sclerotic lesion of the right pubic symphysis (series 2, image 102). IMPRESSION: 1. There has been nearly complete resolution of a previously seen subpleural  mass of the medial left lower lobe overlying the aorta. 2. Interval decrease in size of multiple bilateral pulmonary nodules, in addition to complete resolution of other previously seen small nodules. 3. Interval post treatment sclerosis of multiple bilateral previously seen lytic rib lesions as well as a sclerotic lesion of the right pubic symphysis. 4. Evaluation of the spleen is somewhat limited by breath motion artifact and single phase of contrast, within this limitation, a hypodense lesion of the spleen appears to be slightly diminished in size, and may reflect an improved metastasis, although is incompletely characterized. 5. Overall constellation of findings is consistent with treatment response of primary lung malignancy and metastatic disease. Aortic Atherosclerosis (ICD10-I70.0). Electronically Signed   By: Eddie Candle M.D.   On: 12/11/2020 11:12    ASSESSMENT AND PLAN: This is a very pleasant 60 years old African-American female diagnosed with Stage IV (T2a, N2, M1b) non-small cell lung cancer, adenocarcinoma diagnosed in May 2022 and presented with left lower lobe lung mass in addition to left hilar and mediastinal lymphadenopathy as well as bilateral pulmonary nodules and metastatic disease to the ribs bilaterally. 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 The patient is currently undergoing systemic chemotherapy with carboplatin for AUC of 5, Alimta 500 Mg/M2 and Keytruda 200 Mg IV every 3 weeks status post 3 cycle.  First cycle of her treatment was started at [facility] in [place]. The patient has been tolerating this treatment well with no concerning adverse effect except for mild fatigue. She had repeat CT scan of the chest, abdomen pelvis performed recently.  I personally and independently reviewed  the scans and discussed the results with the patient and her daughter. Her scan showed significant  improvement of her disease. I recommended for her to continue her current treatment with systemic chemotherapy with carboplatin, Alimta and Keytruda for cycle #4. Starting from cycle #5 the patient will be on maintenance treatment with Alimta and Keytruda every 3 weeks. For the COPD and shortness of breath, she will continue on home oxygen. The patient will come back for follow-up visit in 3 weeks for evaluation before starting cycle #5. She was advised to call immediately if she has any concerning symptoms in the interval. The patient voices understanding of current disease status and treatment options and is in agreement with the current care plan.  All questions were answered. The patient knows to call the clinic with any problems, questions or concerns. We can certainly see the patient much sooner if necessary.   Disclaimer: This note was dictated with voice recognition software. Similar sounding words can inadvertently be transcribed and may not be corrected upon review.

## 2020-12-14 NOTE — Patient Instructions (Signed)
[facility]   Discharge Instructions: Thank you for choosing [facility] to provide your oncology and hematology care.   If you have a lab appointment with the [facility], please go directly to the [facility] and check in at the registration area.   Wear comfortable clothing and clothing appropriate for easy access to any Portacath or PICC line.   We strive to give you quality time with your provider. You may need to reschedule your appointment if you arrive late (15 or more minutes).  Arriving late affects you and other patients whose appointments are after yours.  Also, if you miss three or more appointments without notifying the office, you may be dismissed from the clinic at the provider's discretion.      For prescription refill requests, have your pharmacy contact our office and allow 72 hours for refills to be completed.    Today you received the following chemotherapy and/or immunotherapy agents: pemetrexed, pembrolizumab, and carboplatin.      To help prevent nausea and vomiting after your treatment, we encourage you to take your nausea medication as directed.  BELOW ARE SYMPTOMS THAT SHOULD BE REPORTED IMMEDIATELY: *FEVER GREATER THAN 100.4 F (38 C) OR HIGHER *CHILLS OR SWEATING *NAUSEA AND VOMITING THAT IS NOT CONTROLLED WITH YOUR NAUSEA MEDICATION *UNUSUAL SHORTNESS OF BREATH *UNUSUAL BRUISING OR BLEEDING *URINARY PROBLEMS (pain or burning when urinating, or frequent urination) *BOWEL PROBLEMS (unusual diarrhea, constipation, pain near the anus) TENDERNESS IN MOUTH AND THROAT WITH OR WITHOUT PRESENCE OF ULCERS (sore throat, sores in mouth, or a toothache) UNUSUAL RASH, SWELLING OR PAIN  UNUSUAL VAGINAL DISCHARGE OR ITCHING   Items with * indicate a potential emergency and should be followed up as soon as possible or go to the Emergency Department if any problems should occur.  Please show the CHEMOTHERAPY ALERT CARD or  IMMUNOTHERAPY ALERT CARD at check-in to the Emergency Department and triage nurse.  Should you have questions after your visit or need to cancel or reschedule your appointment, please contact [facility]  Dept: [phone]  and follow the prompts.  Office hours are 8:00 a.m. to 4:30 p.m. Monday - Friday. Please note that voicemails left after 4:00 p.m. may not be returned until the following business day.  We are closed weekends and major holidays. You have access to a nurse at all times for urgent questions. Please call the main number to the clinic Dept: [phone] and follow the prompts.   For any non-urgent questions, you may also contact your provider using MyChart. We now offer e-Visits for anyone 36 and older to request care online for non-urgent symptoms. For details visit [email or web address].   Also download the MyChart app! Go to the app store, search "MyChart", open the app, select [facility], and log in with your MyChart username and password.  Due to Covid, a mask is required upon entering the hospital/clinic. If you do not have a mask, one will be given to you upon arrival. For doctor visits, patients may have 1 support person aged 56 or older with them. For treatment visits, patients cannot have anyone with them due to current Covid guidelines and our immunocompromised population.

## 2020-12-18 DIAGNOSIS — R0902 Hypoxemia: Secondary | ICD-10-CM | POA: Diagnosis not present

## 2020-12-21 DIAGNOSIS — E872 Acidosis: Secondary | ICD-10-CM | POA: Diagnosis not present

## 2020-12-21 DIAGNOSIS — R531 Weakness: Secondary | ICD-10-CM | POA: Diagnosis not present

## 2020-12-22 ENCOUNTER — Other Ambulatory Visit: Payer: Self-pay

## 2020-12-22 ENCOUNTER — Other Ambulatory Visit: Payer: BC Managed Care – PPO

## 2020-12-22 ENCOUNTER — Inpatient Hospital Stay: Payer: BC Managed Care – PPO | Attending: Internal Medicine

## 2020-12-22 DIAGNOSIS — Z801 Family history of malignant neoplasm of trachea, bronchus and lung: Secondary | ICD-10-CM | POA: Diagnosis not present

## 2020-12-22 DIAGNOSIS — Z79899 Other long term (current) drug therapy: Secondary | ICD-10-CM | POA: Diagnosis not present

## 2020-12-22 DIAGNOSIS — Z5112 Encounter for antineoplastic immunotherapy: Secondary | ICD-10-CM | POA: Diagnosis not present

## 2020-12-22 DIAGNOSIS — I110 Hypertensive heart disease with heart failure: Secondary | ICD-10-CM | POA: Insufficient documentation

## 2020-12-22 DIAGNOSIS — Z95828 Presence of other vascular implants and grafts: Secondary | ICD-10-CM

## 2020-12-22 DIAGNOSIS — Z9981 Dependence on supplemental oxygen: Secondary | ICD-10-CM | POA: Diagnosis not present

## 2020-12-22 DIAGNOSIS — I509 Heart failure, unspecified: Secondary | ICD-10-CM | POA: Diagnosis not present

## 2020-12-22 DIAGNOSIS — E538 Deficiency of other specified B group vitamins: Secondary | ICD-10-CM | POA: Diagnosis not present

## 2020-12-22 DIAGNOSIS — Z87891 Personal history of nicotine dependence: Secondary | ICD-10-CM | POA: Diagnosis not present

## 2020-12-22 DIAGNOSIS — Z5111 Encounter for antineoplastic chemotherapy: Secondary | ICD-10-CM | POA: Insufficient documentation

## 2020-12-22 DIAGNOSIS — J449 Chronic obstructive pulmonary disease, unspecified: Secondary | ICD-10-CM | POA: Diagnosis not present

## 2020-12-22 DIAGNOSIS — C7951 Secondary malignant neoplasm of bone: Secondary | ICD-10-CM | POA: Diagnosis not present

## 2020-12-22 DIAGNOSIS — J9610 Chronic respiratory failure, unspecified whether with hypoxia or hypercapnia: Secondary | ICD-10-CM | POA: Diagnosis not present

## 2020-12-22 DIAGNOSIS — C3492 Malignant neoplasm of unspecified part of left bronchus or lung: Secondary | ICD-10-CM | POA: Diagnosis not present

## 2020-12-22 LAB — CBC WITH DIFFERENTIAL (CANCER CENTER ONLY)
Abs Immature Granulocytes: 0.01 10*3/uL (ref 0.00–0.07)
Basophils Absolute: 0 10*3/uL (ref 0.0–0.1)
Basophils Relative: 0 %
Eosinophils Absolute: 0.1 10*3/uL (ref 0.0–0.5)
Eosinophils Relative: 1 %
HCT: 33 % — ABNORMAL LOW (ref 36.0–46.0)
Hemoglobin: 10.6 g/dL — ABNORMAL LOW (ref 12.0–15.0)
Immature Granulocytes: 0 %
Lymphocytes Relative: 55 %
Lymphs Abs: 2 10*3/uL (ref 0.7–4.0)
MCH: 30.6 pg (ref 26.0–34.0)
MCHC: 32.1 g/dL (ref 30.0–36.0)
MCV: 95.4 fL (ref 80.0–100.0)
Monocytes Absolute: 0.4 10*3/uL (ref 0.1–1.0)
Monocytes Relative: 10 %
Neutro Abs: 1.3 10*3/uL — ABNORMAL LOW (ref 1.7–7.7)
Neutrophils Relative %: 34 %
Platelet Count: 203 10*3/uL (ref 150–400)
RBC: 3.46 MIL/uL — ABNORMAL LOW (ref 3.87–5.11)
RDW: 16.8 % — ABNORMAL HIGH (ref 11.5–15.5)
WBC Count: 3.8 10*3/uL — ABNORMAL LOW (ref 4.0–10.5)
nRBC: 0 % (ref 0.0–0.2)

## 2020-12-22 LAB — CMP (CANCER CENTER ONLY)
ALT: 42 U/L (ref 0–44)
AST: 33 U/L (ref 15–41)
Albumin: 3.9 g/dL (ref 3.5–5.0)
Alkaline Phosphatase: 87 U/L (ref 38–126)
Anion gap: 13 (ref 5–15)
BUN: 11 mg/dL (ref 8–23)
CO2: 29 mmol/L (ref 22–32)
Calcium: 10 mg/dL (ref 8.9–10.3)
Chloride: 100 mmol/L (ref 98–111)
Creatinine: 0.89 mg/dL (ref 0.44–1.00)
GFR, Estimated: >60 mL/min (ref >60)
Glucose, Bld: 92 mg/dL (ref 70–99)
Potassium: 3.9 mmol/L (ref 3.5–5.1)
Sodium: 142 mmol/L (ref 135–145)
Total Bilirubin: 0.7 mg/dL (ref 0.3–1.2)
Total Protein: 7.6 g/dL (ref 6.5–8.1)

## 2020-12-22 MED ORDER — SODIUM CHLORIDE 0.9% FLUSH
10.0000 mL | Freq: Once | INTRAVENOUS | Status: AC
  Administered 2020-12-22: 10 mL

## 2020-12-22 MED ORDER — HEPARIN SOD (PORK) LOCK FLUSH 100 UNIT/ML IV SOLN
500.0000 [IU] | Freq: Once | INTRAVENOUS | Status: AC
  Administered 2020-12-22: 500 [IU]

## 2020-12-24 ENCOUNTER — Other Ambulatory Visit: Payer: Self-pay

## 2020-12-24 ENCOUNTER — Other Ambulatory Visit: Payer: BC Managed Care – PPO | Admitting: Hospice

## 2020-12-24 DIAGNOSIS — C3492 Malignant neoplasm of unspecified part of left bronchus or lung: Secondary | ICD-10-CM | POA: Diagnosis not present

## 2020-12-24 DIAGNOSIS — Z515 Encounter for palliative care: Secondary | ICD-10-CM

## 2020-12-24 DIAGNOSIS — F419 Anxiety disorder, unspecified: Secondary | ICD-10-CM

## 2020-12-24 DIAGNOSIS — J449 Chronic obstructive pulmonary disease, unspecified: Secondary | ICD-10-CM | POA: Diagnosis not present

## 2020-12-24 NOTE — Progress Notes (Signed)
[facility] Consult Note Telephone: [phone]  Fax: [phone]  PATIENT NAME: Tiffany [place] [id number] [phone] (home)  DOB: March 28, 1961 MRN: [id number]  PRIMARY CARE PROVIDER:    Lujean Amel, MD,  [place] [address] [phone]  REFERRING PROVIDER:   Lujean Amel, MD [place],  [place], [place] [address] [phone]  RESPONSIBLE PARTY:   Self [phone] Contact Information     Name Relation Home Work [place] Daughter [phone]  [phone]      I met face to face with patient at home. Palliative Care was asked to follow this patient by consultation request of  Koirala, Dibas, MD to address advance care planning, complex medical decision making and goals of care clarification. This is the initial visit.    ASSESSMENT AND / RECOMMENDATIONS:   Advance Care Planning: Our advance care planning conversation included a discussion about:    The value and importance of advance care planning  Difference between Hospice and Palliative care Exploration of goals of care in the event of a sudden injury or illness  Identification and preparation of a healthcare agent  Review and updating or creation of an  advance directive document .   CODE STATUS:Discussion on the ramifications and implications of CODE STATUS Patient affirmed she is is a Full code.   Goals of Care: Goals include to maximize quality of life and symptom management  Faith is a source of strength and hope.   I spent 46 minutes providing this initial consultation. More than 50% of the time in this consultation was spent on counseling patient and coordinating communication. --------------------------------------------------------------------------------------------------------------------------------------  Symptom Management/Plan: Metastatic Lung  Cancer: On chemo infusion, next dose on 01/04/2021. Tolerating well.  PPS 60% COPD: No exacerbation at this time. On oxygen supplementation 3 L/Min as needed for respiratory distress. Breathing treatments -Albuterol, Pulmicort on hand Anxiety: Managed with Ativan.  Routine CBC BMP.  Follow up: Palliative care will continue to follow for complex medical decision making, advance care planning, and clarification of goals. Return 6 weeks or prn.Encouraged to call provider sooner with any concerns.   Family /Caregiver/Community Supports: Patient lives at home with with her daughter and family. Strong support system identified.   HOSPICE ELIGIBILITY/DIAGNOSIS: TBD  Chief Complaint: Initial Palliative care visit  HISTORY OF PRESENT ILLNESS:  [name] is a 60 y.o. year old female  with multiple medical conditions including metastatic Lung Cancer, newly diagnosed May 2022, currently on chemo.  She reports lung cancer impairs the quality of her life; she is tolerating ongoing chemo and believes it is helping her cancer.  Patient with history of COPD, anxiety, obstructive sleep apnea.  She denies pain/discomfort.  No respiratory distress during visit. History obtained from review of EMR, discussion with primary team, caregiver, family and/or Ms. Roes.  Review and summarization of Epic records shows history from other than patient. Rest of 10 point ROS asked and negative.     Review of lab tests/diagnostics   Recent Labs  Lab 12/22/20 1543  WBC 3.8*  HGB 10.6*  HCT 33.0*  PLT 203  MCV 95.4   Recent Labs  Lab 12/22/20 1543  NA 142  K 3.9  CL 100  CO2 29  BUN 11  CREATININE 0.89  GLUCOSE 92   Latest GFR by Cockcroft Gault (not valid in AKI or ESRD) Estimated Creatinine Clearance: 76.4 mL/min (by  C-G formula based on SCr of 0.89 mg/dL). Recent Labs  Lab 12/22/20 1543  AST 33  ALT 42  ALKPHOS 87    ROS General: NAD EYES: denies vision changes ENMT: denies  dysphagia Cardiovascular: denies chest pain/discomfort Pulmonary: denies cough, denies SOB Abdomen: endorses good appetite,  no nausea/vomiting, denies constipation/diarrhea GU: denies dysuria, urinary frequency MSK:  endorses occasional weakness,  no falls reported Skin: denies rashes or wounds Neurological: denies pain, denies insomnia Psych: Endorses positive mood Heme/lymph/immuno: denies bruises, abnormal bleeding  Physical Exam: Height/Weight: 4 feet 9 inches/260 Ibs Constitutional: NAD General: Well groomed, well nourished, cooperative EYES: anicteric sclera, lids intact, no discharge  ENMT: Moist mucous membrane CV: S1 S2, RRR, no LE edema Pulmonary: LCTA, no increased work of breathing, no cough, 02 93% room air.  Abdomen: active BS + 4 quadrants, soft and non tender GU: no suprapubic tenderness MSK: weakness, limited ROM Skin: warm and dry, no rashes or wounds on visible skin Neuro:  weakness, otherwise non focal Psych: non-anxious affect Hem/lymph/immuno: no widespread bruising   PAST MEDICAL HISTORY:  Active Ambulatory Problems    Diagnosis Date Noted   Pneumonia 04/15/2018   Chronic respiratory failure with hypoxia (HCC)    OSA (obstructive sleep apnea) 05/11/2018   COPD with chronic bronchitis and emphysema ([facility]) 05/11/2018   Acute on chronic respiratory acidosis 10/18/2020   Adenocarcinoma of left lung, stage 4 ([facility]) 10/22/2020   Encounter for antineoplastic chemotherapy 10/22/2020   Encounter for antineoplastic immunotherapy 10/22/2020   Family history of lung cancer 10/29/2020   Port-A-Cath in place 11/16/2020   Resolved Ambulatory Problems    Diagnosis Date Noted   No Resolved Ambulatory Problems   Past Medical History:  Diagnosis Date   Cancer ([facility])    CHF (congestive heart failure) (HCC)    COPD (chronic obstructive pulmonary disease) ([facility])    Hypertension     SOCIAL HX:  Social History   Tobacco Use   Smoking status: Former    Packs/day:  1.00    Years: 41.00    Pack years: 41.00    Types: Cigarettes    Quit date: 07/01/2014    Years since quitting: 6.4   Smokeless tobacco: Never  Substance Use Topics   Alcohol use: Never     FAMILY HX:  Family History  Problem Relation Age of Onset   Lung cancer Mother        dx 62s; no smoking hx   Diabetes Mellitus II Father    Lung cancer Father        dx 29s; smoking hx   Lung cancer Brother        d. >50; smoking hx   Bone cancer Brother        dx >50   Cancer Maternal Aunt        unknown type; dx after 50      ALLERGIES: No Known Allergies    PERTINENT MEDICATIONS:  Outpatient Encounter Medications as of 12/24/2020  Medication Sig   acetaminophen (TYLENOL) 500 MG tablet Take 2 tablets (1,000 mg total) by mouth every 8 (eight) hours.   budesonide (PULMICORT) 0.5 MG/2ML nebulizer solution Take 0.5 mg by nebulization 2 (two) times daily as needed (Asthma).   carvedilol (COREG) 12.5 MG tablet Take 12.5 mg by mouth 2 (two) times daily with a meal.   Cholecalciferol (VITAMIN D3 PO) Take 1 capsule by mouth daily.   Cyanocobalamin (VITAMIN B-12 PO) Take 1 tablet by mouth daily.   folic acid (  FOLVITE) 1 MG tablet Take 1 tablet (1 mg total) by mouth See admin instructions. Take 1 mg daily by mouth (Patient taking differently: Take 1 mg by mouth daily. Take 1 mg daily by mouth)   hydrochlorothiazide (HYDRODIURIL) 25 MG tablet Take 25 mg by mouth daily.   ipratropium-albuterol (DUONEB) 0.5-2.5 (3) MG/3ML SOLN Take 3 mLs by nebulization every 6 (six) hours as needed. (Patient taking differently: Take 3 mLs by nebulization every 6 (six) hours as needed (Asthma).)   lidocaine (LIDODERM) 5 % Place 1 patch onto the skin daily. Remove & Discard patch within 12 hours or as directed by MD   lidocaine-prilocaine (EMLA) cream Apply 1 application topically as needed (for port access).   LORazepam (ATIVAN) 0.5 MG tablet One tab po 30 min before MRI. Repeat once if needed. (Patient not taking:  Reported on 11/23/2020)   metFORMIN (GLUCOPHAGE-XR) 500 MG 24 hr tablet Take 500 mg by mouth daily with breakfast.   Multiple Vitamins-Minerals (ONE-A-DAY WOMENS PO) Take 1 tablet by mouth daily with breakfast.   oxyCODONE (OXY IR/ROXICODONE) 5 MG immediate release tablet Take 1 tablet (5 mg total) by mouth every 8 (eight) hours as needed for moderate pain. (Patient not taking: No sig reported)   OXYGEN Inhale 3 L/min into the lungs continuous.   prochlorperazine (COMPAZINE) 10 MG tablet Take 10 mg by mouth every 6 (six) hours as needed for nausea or vomiting. (Patient not taking: Reported on 11/23/2020)   Tiotropium Bromide-Olodaterol (STIOLTO RESPIMAT) 2.5-2.5 MCG/ACT AERS Inhale 2 puffs into the lungs daily.   No facility-administered encounter medications on file as of 12/24/2020.     Thank you for the opportunity to participate in the care of Ms. Credeur.  The palliative care team will continue to follow. Please call our office at [phone] if we can be of additional assistance.   Note: Portions of this note were generated with [employer]. Dictation errors may occur despite best attempts at proofreading.  Teodoro Spray, NP

## 2020-12-25 ENCOUNTER — Ambulatory Visit (INDEPENDENT_AMBULATORY_CARE_PROVIDER_SITE_OTHER): Payer: BC Managed Care – PPO | Admitting: Primary Care

## 2020-12-25 ENCOUNTER — Other Ambulatory Visit: Payer: Self-pay

## 2020-12-25 ENCOUNTER — Encounter: Payer: Self-pay | Admitting: Primary Care

## 2020-12-25 VITALS — BP 140/88 | HR 100 | Temp 98.3°F | Ht <= 58 in | Wt 265.4 lb

## 2020-12-25 DIAGNOSIS — J449 Chronic obstructive pulmonary disease, unspecified: Secondary | ICD-10-CM | POA: Diagnosis not present

## 2020-12-25 DIAGNOSIS — G4733 Obstructive sleep apnea (adult) (pediatric): Secondary | ICD-10-CM | POA: Diagnosis not present

## 2020-12-25 DIAGNOSIS — C3492 Malignant neoplasm of unspecified part of left bronchus or lung: Secondary | ICD-10-CM

## 2020-12-25 DIAGNOSIS — I5033 Acute on chronic diastolic (congestive) heart failure: Secondary | ICD-10-CM | POA: Diagnosis not present

## 2020-12-25 MED ORDER — TIOTROPIUM BROMIDE-OLODATEROL 2.5-2.5 MCG/ACT IN AERS: 2.0000 | INHALATION_SPRAY | Freq: Every day | RESPIRATORY_TRACT | 11 refills | Status: AC

## 2020-12-25 MED ORDER — STIOLTO RESPIMAT 2.5-2.5 MCG/ACT IN AERS: 2.0000 | INHALATION_SPRAY | Freq: Every day | RESPIRATORY_TRACT | 11 refills | Status: DC

## 2020-12-25 NOTE — Patient Instructions (Signed)
COPD: - Continue Stiolto 2 puffs in the morning (refills sent) - Use pulmicort (budesonide) nebulizer two times daily as needed for asthma symtpoms (wheezing/chest tightness/cough)  Respiratory failure:  - Continue to wear Triology ventilator every night - Continue to use 3L oxygen as needed with exertion and at night   Follow-up: - 6 months with Dr. Elsworth Soho or sooner if needed

## 2020-12-25 NOTE — Progress Notes (Signed)
_0  ID: Tiffany Owen, female    DOB: 10-04-1960, 60 y.o.   MRN: [id number]  Chief Complaint  Patient presents with   Follow-up    Pt states no concerns    Referring provider: Lujean Amel, MD  HPI: 60 year old female, former smoker quit 2016 (41-pack-year history).  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  Non-small cell lung cancer diagnosed in May 2022.  Initially presented with left lower lobe lung mass in addition to left hilar and mediastinal lymphadenopathy as well as bilateral pulmonary nodules and metastatic disease to ribs bilaterally.  Her molecular studies were positive.  EGFR mutation as well as PD L1 expression of 100%.  Undergoing chemotherapy.    12/25/2020 Patient presents today for overdue follow-up COPD/OSA. She is currently undergoing systemic chemotherapy every 3 weeks s/p 4 cycles.  She is following with Dr. Earlie Server, last seen by him on 12/14/2020. She had repeat CT scan chest/abdomen/pelvis that showed significant improvement of her disease. Recommend continuing treatment. Referred back to our office by Dr. Earlie Server for management COPD/OSA.   HST in February 2020 showed moderate OSA, AHI 16/hr with severe oxygen desaturations. Dr Elsworth Soho recommended CPAP titration. She was put on Triology 2 month ago after a hospitalization. She wears 3L oxygen as needed during daytime and at night.   She reports 100% compliance with Trilogy at night. She is sleeping well. She wakes up on averge three times a night d/t dry mouth and needing to use the restroom. Normal bedtime is around 10pm. She gets out of bed at 8am. She gets on average 8-9 hours of sleep a night. She feels well rested in the morning. She does not have significant daytime fatigue and does not require naps during the day. She is happy with NIV.   Her breathing is described as good. Her  daughter will notice she gets out of breath with some ADLs. Oxygen causes dry mouth and cough. She is compliant with Stiolto and pulmicort nebulizer as needed <once a week. Denies chest tightness, wheezing or cough.    7/3>> CTA chest: No PE, mass in the left lower lobe suspicious for malignancy, multiple bilateral nodules, enlarged left hilar lymph nodes, new cystic lesion in the left fifth rib, eighth rib and right fifth rib. 7/3>> CT head: No acute intracranial abnormality. 7/4>> TTE: EF 55-60%, no regional wall motion abnormalities   No Known Allergies  Immunization History  Administered Date(s) Administered   Moderna Sars-Covid-2 Vaccination 11/20/2019, 12/18/2019   Pneumococcal Polysaccharide-23 10/20/2020    Past Medical History:  Diagnosis Date   Cancer ([place])    CHF (congestive heart failure) (HCC)    COPD (chronic obstructive pulmonary disease) (HCC)    Family history of lung cancer 10/29/2020   Hypertension    Pneumonia 04/17/2018    Tobacco History: Social History   Tobacco Use  Smoking Status Former   Packs/day: 1.00   Years: 41.00   Pack years: 41.00   Types: Cigarettes   Quit date: 07/01/2014   Years since quitting: 6.4  Smokeless Tobacco Never   Counseling given: Not Answered   Outpatient Medications Prior to Visit  Medication Sig Dispense Refill   acetaminophen (TYLENOL) 500 MG tablet Take 2 tablets (1,000 mg total) by mouth every 8 (eight) hours. 30 tablet 0   budesonide (PULMICORT) 0.5 MG/2ML nebulizer solution Take 0.5 mg by nebulization 2 (two) times daily as needed (  Asthma).     carvedilol (COREG) 12.5 MG tablet Take 12.5 mg by mouth 2 (two) times daily with a meal.     Cholecalciferol (VITAMIN D3 PO) Take 1 capsule by mouth daily.     Cyanocobalamin (VITAMIN B-12 PO) Take 1 tablet by mouth daily.     folic acid (FOLVITE) 1 MG tablet Take 1 tablet (1 mg total) by mouth See admin instructions. Take 1 mg daily by mouth (Patient taking differently:  Take 1 mg by mouth daily. Take 1 mg daily by mouth) 30 tablet 2   hydrochlorothiazide (HYDRODIURIL) 25 MG tablet Take 25 mg by mouth daily.     ipratropium-albuterol (DUONEB) 0.5-2.5 (3) MG/3ML SOLN Take 3 mLs by nebulization every 6 (six) hours as needed. (Patient taking differently: Take 3 mLs by nebulization every 6 (six) hours as needed (Asthma).) 360 mL 1   lidocaine (LIDODERM) 5 % Place 1 patch onto the skin daily. Remove & Discard patch within 12 hours or as directed by MD 30 patch 0   lidocaine-prilocaine (EMLA) cream Apply 1 application topically as needed (for port access). 30 g 0   LORazepam (ATIVAN) 0.5 MG tablet One tab po 30 min before MRI. Repeat once if needed. 2 tablet 0   metFORMIN (GLUCOPHAGE-XR) 500 MG 24 hr tablet Take 500 mg by mouth daily with breakfast.     Multiple Vitamins-Minerals (ONE-A-DAY WOMENS PO) Take 1 tablet by mouth daily with breakfast.     oxyCODONE (OXY IR/ROXICODONE) 5 MG immediate release tablet Take 1 tablet (5 mg total) by mouth every 8 (eight) hours as needed for moderate pain. 20 tablet 0   OXYGEN Inhale 3 L/min into the lungs continuous.     prochlorperazine (COMPAZINE) 10 MG tablet Take 10 mg by mouth every 6 (six) hours as needed for nausea or vomiting.     Tiotropium Bromide-Olodaterol (STIOLTO RESPIMAT) 2.5-2.5 MCG/ACT AERS Inhale 2 puffs into the lungs daily. 1 Inhaler 0   No facility-administered medications prior to visit.      Review of Systems  Review of Systems  Constitutional: Negative.   HENT: Negative.    Respiratory:  Positive for cough. Negative for chest tightness, shortness of breath and wheezing.   Cardiovascular: Negative.     Physical Exam  BP 140/88 (BP Location: Left Arm, Patient Position: Sitting, Cuff Size: Large)   Pulse 100   Temp 98.3 F (36.8 C) (Oral)   Ht _0  (1.448 m)   Wt 265 lb 6.4 oz (120.4 kg)   SpO2 97%   BMI 57.43 kg/m  Physical Exam Constitutional:      Appearance: Normal appearance.  HENT:      Head: Normocephalic and atraumatic.     Mouth/Throat:     Mouth: Mucous membranes are moist.  Cardiovascular:     Rate and Rhythm: Normal rate and regular rhythm.  Pulmonary:     Effort: Pulmonary effort is normal.  Neurological:     General: No focal deficit present.     Mental Status: She is alert and oriented to person, place, and time. Mental status is at baseline.  Psychiatric:        Mood and Affect: Mood normal.        Behavior: Behavior normal.        Thought Content: Thought content normal.        Judgment: Judgment normal.     Lab Results:  CBC    Component Value Date/Time   WBC 3.8 (L) 12/22/2020 1543  WBC 13.1 (H) 10/19/2020 0122   RBC 3.46 (L) 12/22/2020 1543   HGB 10.6 (L) 12/22/2020 1543   HCT 33.0 (L) 12/22/2020 1543   PLT 203 12/22/2020 1543   MCV 95.4 12/22/2020 1543   MCH 30.6 12/22/2020 1543   MCHC 32.1 12/22/2020 1543   RDW 16.8 (H) 12/22/2020 1543   LYMPHSABS 2.0 12/22/2020 1543   MONOABS 0.4 12/22/2020 1543   EOSABS 0.1 12/22/2020 1543   BASOSABS 0.0 12/22/2020 1543    BMET    Component Value Date/Time   NA 142 12/22/2020 1543   K 3.9 12/22/2020 1543   CL 100 12/22/2020 1543   CO2 29 12/22/2020 1543   GLUCOSE 92 12/22/2020 1543   BUN 11 12/22/2020 1543   CREATININE 0.89 12/22/2020 1543   CALCIUM 10.0 12/22/2020 1543   GFRNONAA >60 12/22/2020 1543   GFRAA >60 04/17/2018 0732    BNP    Component Value Date/Time   BNP 793.3 (H) 10/18/2020 0729    ProBNP No results found for: PROBNP  Imaging: CT Chest W Contrast  Result Date: 12/11/2020 CLINICAL DATA:  Primary Cancer Type: Lung Imaging Indication: Assess response to therapy Interval therapy since last imaging? Yes Initial Cancer Diagnosis Date: 09/02/2020; Established by: Biopsy-proven Detailed Pathology: Stage IV non-small cell lung cancer, adenocarcinoma. Primary Tumor location: Left lower lobe. Metastatic disease to the ribs. Surgeries: No. Chemotherapy: Yes; Ongoing? Yes;  Most recent administration: 11/23/2020 Immunotherapy?  Yes; Type: Keytruda; Ongoing? Yes Radiation therapy? No EXAM: CT CHEST, ABDOMEN, AND PELVIS WITH CONTRAST TECHNIQUE: Multidetector CT imaging of the chest, abdomen and pelvis was performed following the standard protocol during bolus administration of intravenous contrast. CONTRAST:  18m OMNIPAQUE IOHEXOL 350 MG/ML SOLN, additional oral enteric contrast COMPARISON:  CT chest angiogram, 10/18/2020, outside PET-CT, 07/24/2020 FINDINGS: CT CHEST FINDINGS Cardiovascular: Right chest port catheter. Aortic atherosclerosis. Normal heart size. No pericardial effusion. Mediastinum/Nodes: No enlarged mediastinal, hilar, or axillary lymph nodes. Thyroid gland, trachea, and esophagus demonstrate no significant findings. Lungs/Pleura: Moderate centrilobular emphysema. Diffuse bilateral bronchial wall thickening. There has been nearly complete resolution of a previously seen subpleural mass of the medial left lower lobe overlying the aorta, now measuring no greater than 1.0 x 0.5 cm, previously 3.6 x 3.0 cm when measured similarly (series 4, image 73). Interval decrease in size of a left apical pulmonary nodule, measuring 1.2 x 0.9 cm, previously 1.4 x 1.4 cm (series 4, image 26). Interval decrease in size of a pulmonary nodule of the left upper lobe, measuring 0.5 cm, previously 0.8 cm (series 4, image 43). Interval decrease in size of a small nodule of the anterior right middle lobe, and measuring no greater than 0.3 cm, previously 0.4 cm (series 4, image 90). Other small nodules are completely resolved, for example a previously noted nodules in the dependent right lower lobe (series 4, image 96). Bandlike scarring of the anterior left lower lobe (series 4, image 86). No pleural effusion or pneumothorax. Musculoskeletal: No chest wall mass. Interval sclerosis of a previously seen lytic lesion of the left fifth rib (series 2, image 24), left eighth rib (series 2, image  41), right second rib (series 2, image 12), and right fourth rib (series 2, image 25). CT ABDOMEN PELVIS FINDINGS Hepatobiliary: No solid liver abnormality is seen. Hepatic steatosis. No gallstones, gallbladder wall thickening, or biliary dilatation. Pancreas: Unremarkable. No pancreatic ductal dilatation or surrounding inflammatory changes. Spleen: Normal in size. Evaluation of the spleen is somewhat limited by breath motion artifact and single phase  of contrast, within this limitation, a hypodense lesion of the spleen is slightly diminished in size, measuring approximately 1.1 cm, previously 1.6 cm (series 2, image 53). Adrenals/Urinary Tract: Adrenal glands are unremarkable. Kidneys are normal, without renal calculi, solid lesion, or hydronephrosis. Bladder is unremarkable. Stomach/Bowel: Stomach is within normal limits. Appendix appears normal. No evidence of bowel wall thickening, distention, or inflammatory changes. Pancolonic diverticulosis. Vascular/Lymphatic: Aortic atherosclerosis. Unchanged prominent bilateral iliac and inguinal lymph nodes, not previously FDG PET avid (series 2, image 89, 103). Reproductive: Uterine fibroids. Other: No abdominal wall hernia or abnormality. No abdominopelvic ascites. Musculoskeletal: No acute findings. Sclerotic lesion of the right pubic symphysis (series 2, image 102). IMPRESSION: 1. There has been nearly complete resolution of a previously seen subpleural mass of the medial left lower lobe overlying the aorta. 2. Interval decrease in size of multiple bilateral pulmonary nodules, in addition to complete resolution of other previously seen small nodules. 3. Interval post treatment sclerosis of multiple bilateral previously seen lytic rib lesions as well as a sclerotic lesion of the right pubic symphysis. 4. Evaluation of the spleen is somewhat limited by breath motion artifact and single phase of contrast, within this limitation, a hypodense lesion of the spleen appears  to be slightly diminished in size, and may reflect an improved metastasis, although is incompletely characterized. 5. Overall constellation of findings is consistent with treatment response of primary lung malignancy and metastatic disease. Aortic Atherosclerosis (ICD10-I70.0). Electronically Signed   By: Eddie Candle M.D.   On: 12/11/2020 11:12   CT Abdomen Pelvis W Contrast  Result Date: 12/11/2020 CLINICAL DATA:  Primary Cancer Type: Lung Imaging Indication: Assess response to therapy Interval therapy since last imaging? Yes Initial Cancer Diagnosis Date: 09/02/2020; Established by: Biopsy-proven Detailed Pathology: Stage IV non-small cell lung cancer, adenocarcinoma. Primary Tumor location: Left lower lobe. Metastatic disease to the ribs. Surgeries: No. Chemotherapy: Yes; Ongoing? Yes; Most recent administration: 11/23/2020 Immunotherapy?  Yes; Type: Keytruda; Ongoing? Yes Radiation therapy? No EXAM: CT CHEST, ABDOMEN, AND PELVIS WITH CONTRAST TECHNIQUE: Multidetector CT imaging of the chest, abdomen and pelvis was performed following the standard protocol during bolus administration of intravenous contrast. CONTRAST:  183m OMNIPAQUE IOHEXOL 350 MG/ML SOLN, additional oral enteric contrast COMPARISON:  CT chest angiogram, 10/18/2020, outside PET-CT, 07/24/2020 FINDINGS: CT CHEST FINDINGS Cardiovascular: Right chest port catheter. Aortic atherosclerosis. Normal heart size. No pericardial effusion. Mediastinum/Nodes: No enlarged mediastinal, hilar, or axillary lymph nodes. Thyroid gland, trachea, and esophagus demonstrate no significant findings. Lungs/Pleura: Moderate centrilobular emphysema. Diffuse bilateral bronchial wall thickening. There has been nearly complete resolution of a previously seen subpleural mass of the medial left lower lobe overlying the aorta, now measuring no greater than 1.0 x 0.5 cm, previously 3.6 x 3.0 cm when measured similarly (series 4, image 73). Interval decrease in size of a  left apical pulmonary nodule, measuring 1.2 x 0.9 cm, previously 1.4 x 1.4 cm (series 4, image 26). Interval decrease in size of a pulmonary nodule of the left upper lobe, measuring 0.5 cm, previously 0.8 cm (series 4, image 43). Interval decrease in size of a small nodule of the anterior right middle lobe, and measuring no greater than 0.3 cm, previously 0.4 cm (series 4, image 90). Other small nodules are completely resolved, for example a previously noted nodules in the dependent right lower lobe (series 4, image 96). Bandlike scarring of the anterior left lower lobe (series 4, image 86). No pleural effusion or pneumothorax. Musculoskeletal: No chest wall mass. Interval sclerosis of  a previously seen lytic lesion of the left fifth rib (series 2, image 24), left eighth rib (series 2, image 41), right second rib (series 2, image 12), and right fourth rib (series 2, image 25). CT ABDOMEN PELVIS FINDINGS Hepatobiliary: No solid liver abnormality is seen. Hepatic steatosis. No gallstones, gallbladder wall thickening, or biliary dilatation. Pancreas: Unremarkable. No pancreatic ductal dilatation or surrounding inflammatory changes. Spleen: Normal in size. Evaluation of the spleen is somewhat limited by breath motion artifact and single phase of contrast, within this limitation, a hypodense lesion of the spleen is slightly diminished in size, measuring approximately 1.1 cm, previously 1.6 cm (series 2, image 53). Adrenals/Urinary Tract: Adrenal glands are unremarkable. Kidneys are normal, without renal calculi, solid lesion, or hydronephrosis. Bladder is unremarkable. Stomach/Bowel: Stomach is within normal limits. Appendix appears normal. No evidence of bowel wall thickening, distention, or inflammatory changes. Pancolonic diverticulosis. Vascular/Lymphatic: Aortic atherosclerosis. Unchanged prominent bilateral iliac and inguinal lymph nodes, not previously FDG PET avid (series 2, image 89, 103). Reproductive: Uterine  fibroids. Other: No abdominal wall hernia or abnormality. No abdominopelvic ascites. Musculoskeletal: No acute findings. Sclerotic lesion of the right pubic symphysis (series 2, image 102). IMPRESSION: 1. There has been nearly complete resolution of a previously seen subpleural mass of the medial left lower lobe overlying the aorta. 2. Interval decrease in size of multiple bilateral pulmonary nodules, in addition to complete resolution of other previously seen small nodules. 3. Interval post treatment sclerosis of multiple bilateral previously seen lytic rib lesions as well as a sclerotic lesion of the right pubic symphysis. 4. Evaluation of the spleen is somewhat limited by breath motion artifact and single phase of contrast, within this limitation, a hypodense lesion of the spleen appears to be slightly diminished in size, and may reflect an improved metastasis, although is incompletely characterized. 5. Overall constellation of findings is consistent with treatment response of primary lung malignancy and metastatic disease. Aortic Atherosclerosis (ICD10-I70.0). Electronically Signed   By: Eddie Candle M.D.   On: 12/11/2020 11:12     Assessment & Plan:   OSA (obstructive sleep apnea) - HST February 2020 showed moderate OSA, AHI 16/hr with severe oxygen desaturations - Ordered for  CPAP titration which was not completed - Currently on Trilogy NIV with 3L oxygen at night, tolerating this well and reports 100% compliance. No daytime sleepiness noted.  - We will get a download from [facility] - No changes today.   COPD with chronic bronchitis and emphysema ([place]) - Stable on Stiolto Respimat + prn Pulmicort nebulizer   Adenocarcinoma of left lung, stage 4 (HCC) - Undergoing chemotherapy q3 weeks, s/p 4 rounds  - Following with Dr. Earlie Server   FU in 6 months or sooner   Martyn Ehrich, NP 12/28/2020

## 2020-12-27 DIAGNOSIS — C3490 Malignant neoplasm of unspecified part of unspecified bronchus or lung: Secondary | ICD-10-CM | POA: Diagnosis not present

## 2020-12-27 DIAGNOSIS — G4733 Obstructive sleep apnea (adult) (pediatric): Secondary | ICD-10-CM | POA: Diagnosis not present

## 2020-12-27 DIAGNOSIS — J449 Chronic obstructive pulmonary disease, unspecified: Secondary | ICD-10-CM | POA: Diagnosis not present

## 2020-12-27 DIAGNOSIS — J9611 Chronic respiratory failure with hypoxia: Secondary | ICD-10-CM | POA: Diagnosis not present

## 2020-12-28 ENCOUNTER — Other Ambulatory Visit: Payer: Self-pay

## 2020-12-28 ENCOUNTER — Inpatient Hospital Stay: Payer: BC Managed Care – PPO

## 2020-12-28 ENCOUNTER — Other Ambulatory Visit: Payer: BC Managed Care – PPO

## 2020-12-28 DIAGNOSIS — Z95828 Presence of other vascular implants and grafts: Secondary | ICD-10-CM

## 2020-12-28 DIAGNOSIS — J449 Chronic obstructive pulmonary disease, unspecified: Secondary | ICD-10-CM | POA: Diagnosis not present

## 2020-12-28 DIAGNOSIS — Z5111 Encounter for antineoplastic chemotherapy: Secondary | ICD-10-CM | POA: Diagnosis not present

## 2020-12-28 DIAGNOSIS — Z87891 Personal history of nicotine dependence: Secondary | ICD-10-CM | POA: Diagnosis not present

## 2020-12-28 DIAGNOSIS — C3492 Malignant neoplasm of unspecified part of left bronchus or lung: Secondary | ICD-10-CM

## 2020-12-28 DIAGNOSIS — I110 Hypertensive heart disease with heart failure: Secondary | ICD-10-CM | POA: Diagnosis not present

## 2020-12-28 DIAGNOSIS — I509 Heart failure, unspecified: Secondary | ICD-10-CM | POA: Diagnosis not present

## 2020-12-28 DIAGNOSIS — Z79899 Other long term (current) drug therapy: Secondary | ICD-10-CM | POA: Diagnosis not present

## 2020-12-28 DIAGNOSIS — E538 Deficiency of other specified B group vitamins: Secondary | ICD-10-CM | POA: Diagnosis not present

## 2020-12-28 DIAGNOSIS — C7951 Secondary malignant neoplasm of bone: Secondary | ICD-10-CM | POA: Diagnosis not present

## 2020-12-28 DIAGNOSIS — Z5112 Encounter for antineoplastic immunotherapy: Secondary | ICD-10-CM | POA: Diagnosis not present

## 2020-12-28 DIAGNOSIS — Z801 Family history of malignant neoplasm of trachea, bronchus and lung: Secondary | ICD-10-CM | POA: Diagnosis not present

## 2020-12-28 DIAGNOSIS — Z9981 Dependence on supplemental oxygen: Secondary | ICD-10-CM | POA: Diagnosis not present

## 2020-12-28 LAB — CMP (CANCER CENTER ONLY)
ALT: 54 U/L — ABNORMAL HIGH (ref 0–44)
AST: 48 U/L — ABNORMAL HIGH (ref 15–41)
Albumin: 3.7 g/dL (ref 3.5–5.0)
Alkaline Phosphatase: 94 U/L (ref 38–126)
Anion gap: 12 (ref 5–15)
BUN: 9 mg/dL (ref 8–23)
CO2: 28 mmol/L (ref 22–32)
Calcium: 9.8 mg/dL (ref 8.9–10.3)
Chloride: 102 mmol/L (ref 98–111)
Creatinine: 0.78 mg/dL (ref 0.44–1.00)
GFR, Estimated: >60 mL/min (ref >60)
Glucose, Bld: 98 mg/dL (ref 70–99)
Potassium: 3.9 mmol/L (ref 3.5–5.1)
Sodium: 142 mmol/L (ref 135–145)
Total Bilirubin: 0.2 mg/dL — ABNORMAL LOW (ref 0.3–1.2)
Total Protein: 7.2 g/dL (ref 6.5–8.1)

## 2020-12-28 LAB — CBC WITH DIFFERENTIAL (CANCER CENTER ONLY)
Abs Immature Granulocytes: 0.03 10*3/uL (ref 0.00–0.07)
Basophils Absolute: 0 10*3/uL (ref 0.0–0.1)
Basophils Relative: 0 %
Eosinophils Absolute: 0.1 10*3/uL (ref 0.0–0.5)
Eosinophils Relative: 1 %
HCT: 31.3 % — ABNORMAL LOW (ref 36.0–46.0)
Hemoglobin: 10.3 g/dL — ABNORMAL LOW (ref 12.0–15.0)
Immature Granulocytes: 1 %
Lymphocytes Relative: 46 %
Lymphs Abs: 1.9 10*3/uL (ref 0.7–4.0)
MCH: 31.6 pg (ref 26.0–34.0)
MCHC: 32.9 g/dL (ref 30.0–36.0)
MCV: 96 fL (ref 80.0–100.0)
Monocytes Absolute: 0.7 10*3/uL (ref 0.1–1.0)
Monocytes Relative: 16 %
Neutro Abs: 1.5 10*3/uL — ABNORMAL LOW (ref 1.7–7.7)
Neutrophils Relative %: 36 %
Platelet Count: 130 10*3/uL — ABNORMAL LOW (ref 150–400)
RBC: 3.26 MIL/uL — ABNORMAL LOW (ref 3.87–5.11)
RDW: 17.6 % — ABNORMAL HIGH (ref 11.5–15.5)
WBC Count: 4.1 10*3/uL (ref 4.0–10.5)
nRBC: 1.5 % — ABNORMAL HIGH (ref 0.0–0.2)

## 2020-12-28 LAB — TSH: TSH: 0.768 u[IU]/mL (ref 0.308–3.960)

## 2020-12-28 MED ORDER — SODIUM CHLORIDE 0.9% FLUSH
10.0000 mL | Freq: Once | INTRAVENOUS | Status: AC
  Administered 2020-12-28: 10 mL

## 2020-12-28 MED ORDER — HEPARIN SOD (PORK) LOCK FLUSH 100 UNIT/ML IV SOLN
500.0000 [IU] | Freq: Once | INTRAVENOUS | Status: AC
  Administered 2020-12-28: 500 [IU]

## 2020-12-28 NOTE — Assessment & Plan Note (Signed)
-   Stable on Stiolto Respimat + prn Pulmicort nebulizer

## 2020-12-28 NOTE — Assessment & Plan Note (Signed)
-   Undergoing chemotherapy q3 weeks, s/p 4 rounds  - Following with Dr. Earlie Server

## 2020-12-28 NOTE — Assessment & Plan Note (Addendum)
-   HST February 2020 showed moderate OSA, AHI 16/hr with severe oxygen desaturations - Ordered for  CPAP titration which was not completed - Currently on Trilogy NIV with 3L oxygen at night, tolerating this well and reports 100% compliance. No daytime sleepiness noted.  - We will get a download from [facility] - No changes today.

## 2021-01-04 ENCOUNTER — Other Ambulatory Visit: Payer: Self-pay

## 2021-01-04 ENCOUNTER — Inpatient Hospital Stay: Payer: BC Managed Care – PPO

## 2021-01-04 ENCOUNTER — Inpatient Hospital Stay (HOSPITAL_BASED_OUTPATIENT_CLINIC_OR_DEPARTMENT_OTHER): Payer: BC Managed Care – PPO | Admitting: Internal Medicine

## 2021-01-04 ENCOUNTER — Telehealth: Payer: Self-pay | Admitting: Internal Medicine

## 2021-01-04 ENCOUNTER — Other Ambulatory Visit: Payer: BC Managed Care – PPO

## 2021-01-04 VITALS — BP 141/96 | HR 83 | Temp 97.6°F | Resp 20 | Ht <= 58 in | Wt 266.2 lb

## 2021-01-04 DIAGNOSIS — C7951 Secondary malignant neoplasm of bone: Secondary | ICD-10-CM | POA: Diagnosis not present

## 2021-01-04 DIAGNOSIS — C3492 Malignant neoplasm of unspecified part of left bronchus or lung: Secondary | ICD-10-CM

## 2021-01-04 DIAGNOSIS — I110 Hypertensive heart disease with heart failure: Secondary | ICD-10-CM | POA: Diagnosis not present

## 2021-01-04 DIAGNOSIS — Z95828 Presence of other vascular implants and grafts: Secondary | ICD-10-CM

## 2021-01-04 DIAGNOSIS — Z87891 Personal history of nicotine dependence: Secondary | ICD-10-CM | POA: Diagnosis not present

## 2021-01-04 DIAGNOSIS — Z801 Family history of malignant neoplasm of trachea, bronchus and lung: Secondary | ICD-10-CM | POA: Diagnosis not present

## 2021-01-04 DIAGNOSIS — E538 Deficiency of other specified B group vitamins: Secondary | ICD-10-CM

## 2021-01-04 DIAGNOSIS — Z79899 Other long term (current) drug therapy: Secondary | ICD-10-CM | POA: Diagnosis not present

## 2021-01-04 DIAGNOSIS — Z5111 Encounter for antineoplastic chemotherapy: Secondary | ICD-10-CM | POA: Diagnosis not present

## 2021-01-04 DIAGNOSIS — Z5112 Encounter for antineoplastic immunotherapy: Secondary | ICD-10-CM | POA: Diagnosis not present

## 2021-01-04 DIAGNOSIS — Z9981 Dependence on supplemental oxygen: Secondary | ICD-10-CM | POA: Diagnosis not present

## 2021-01-04 DIAGNOSIS — I509 Heart failure, unspecified: Secondary | ICD-10-CM | POA: Diagnosis not present

## 2021-01-04 DIAGNOSIS — J449 Chronic obstructive pulmonary disease, unspecified: Secondary | ICD-10-CM | POA: Diagnosis not present

## 2021-01-04 LAB — CMP (CANCER CENTER ONLY)
ALT: 53 U/L — ABNORMAL HIGH (ref 0–44)
AST: 45 U/L — ABNORMAL HIGH (ref 15–41)
Albumin: 3.7 g/dL (ref 3.5–5.0)
Alkaline Phosphatase: 91 U/L (ref 38–126)
Anion gap: 12 (ref 5–15)
BUN: 11 mg/dL (ref 8–23)
CO2: 28 mmol/L (ref 22–32)
Calcium: 9.9 mg/dL (ref 8.9–10.3)
Chloride: 104 mmol/L (ref 98–111)
Creatinine: 0.8 mg/dL (ref 0.44–1.00)
GFR, Estimated: >60 mL/min (ref >60)
Glucose, Bld: 93 mg/dL (ref 70–99)
Potassium: 4.1 mmol/L (ref 3.5–5.1)
Sodium: 144 mmol/L (ref 135–145)
Total Bilirubin: <0.2 mg/dL — ABNORMAL LOW (ref 0.3–1.2)
Total Protein: 7.2 g/dL (ref 6.5–8.1)

## 2021-01-04 LAB — CBC WITH DIFFERENTIAL (CANCER CENTER ONLY)
Abs Immature Granulocytes: 0.03 10*3/uL (ref 0.00–0.07)
Basophils Absolute: 0 10*3/uL (ref 0.0–0.1)
Basophils Relative: 0 %
Eosinophils Absolute: 0.1 10*3/uL (ref 0.0–0.5)
Eosinophils Relative: 1 %
HCT: 34.6 % — ABNORMAL LOW (ref 36.0–46.0)
Hemoglobin: 11 g/dL — ABNORMAL LOW (ref 12.0–15.0)
Immature Granulocytes: 1 %
Lymphocytes Relative: 39 %
Lymphs Abs: 1.8 10*3/uL (ref 0.7–4.0)
MCH: 31.7 pg (ref 26.0–34.0)
MCHC: 31.8 g/dL (ref 30.0–36.0)
MCV: 99.7 fL (ref 80.0–100.0)
Monocytes Absolute: 0.9 10*3/uL (ref 0.1–1.0)
Monocytes Relative: 20 %
Neutro Abs: 1.7 10*3/uL (ref 1.7–7.7)
Neutrophils Relative %: 39 %
Platelet Count: 271 10*3/uL (ref 150–400)
RBC: 3.47 MIL/uL — ABNORMAL LOW (ref 3.87–5.11)
RDW: 19.9 % — ABNORMAL HIGH (ref 11.5–15.5)
WBC Count: 4.5 10*3/uL (ref 4.0–10.5)
nRBC: 0.7 % — ABNORMAL HIGH (ref 0.0–0.2)

## 2021-01-04 MED ORDER — SODIUM CHLORIDE 0.9 % IV SOLN: Freq: Once | INTRAVENOUS | Status: AC

## 2021-01-04 MED ORDER — PROCHLORPERAZINE MALEATE 10 MG PO TABS
10.0000 mg | ORAL_TABLET | Freq: Once | ORAL | Status: AC
  Administered 2021-01-04: 10 mg via ORAL
  Filled 2021-01-04: qty 1

## 2021-01-04 MED ORDER — HEPARIN SOD (PORK) LOCK FLUSH 100 UNIT/ML IV SOLN
500.0000 [IU] | Freq: Once | INTRAVENOUS | Status: AC | PRN
  Administered 2021-01-04: 500 [IU]

## 2021-01-04 MED ORDER — SODIUM CHLORIDE 0.9% FLUSH
10.0000 mL | INTRAVENOUS | Status: DC | PRN
  Administered 2021-01-04: 10 mL

## 2021-01-04 MED ORDER — SODIUM CHLORIDE 0.9 % IV SOLN
500.0000 mg/m2 | Freq: Once | INTRAVENOUS | Status: AC
  Administered 2021-01-04: 1100 mg via INTRAVENOUS
  Filled 2021-01-04: qty 40

## 2021-01-04 MED ORDER — SODIUM CHLORIDE 0.9 % IV SOLN
200.0000 mg | Freq: Once | INTRAVENOUS | Status: AC
  Administered 2021-01-04: 200 mg via INTRAVENOUS
  Filled 2021-01-04: qty 8

## 2021-01-04 MED ORDER — SODIUM CHLORIDE 0.9% FLUSH
10.0000 mL | Freq: Once | INTRAVENOUS | Status: AC
  Administered 2021-01-04: 10 mL

## 2021-01-04 NOTE — Patient Instructions (Signed)
[facility]  Discharge Instructions: Thank you for choosing [facility] to provide your oncology and hematology care.   If you have a lab appointment with the [facility], please go directly to the [facility] and check in at the registration area.   Wear comfortable clothing and clothing appropriate for easy access to any Portacath or PICC line.   We strive to give you quality time with your provider. You may need to reschedule your appointment if you arrive late (15 or more minutes).  Arriving late affects you and other patients whose appointments are after yours.  Also, if you miss three or more appointments without notifying the office, you may be dismissed from the clinic at the provider's discretion.      For prescription refill requests, have your pharmacy contact our office and allow 72 hours for refills to be completed.    Today you received the following chemotherapy and/or immunotherapy agents : Keytruda, Alimta      To help prevent nausea and vomiting after your treatment, we encourage you to take your nausea medication as directed.  BELOW ARE SYMPTOMS THAT SHOULD BE REPORTED IMMEDIATELY: *FEVER GREATER THAN 100.4 F (38 C) OR HIGHER *CHILLS OR SWEATING *NAUSEA AND VOMITING THAT IS NOT CONTROLLED WITH YOUR NAUSEA MEDICATION *UNUSUAL SHORTNESS OF BREATH *UNUSUAL BRUISING OR BLEEDING *URINARY PROBLEMS (pain or burning when urinating, or frequent urination) *BOWEL PROBLEMS (unusual diarrhea, constipation, pain near the anus) TENDERNESS IN MOUTH AND THROAT WITH OR WITHOUT PRESENCE OF ULCERS (sore throat, sores in mouth, or a toothache) UNUSUAL RASH, SWELLING OR PAIN  UNUSUAL VAGINAL DISCHARGE OR ITCHING   Items with * indicate a potential emergency and should be followed up as soon as possible or go to the Emergency Department if any problems should occur.  Please show the CHEMOTHERAPY ALERT CARD or IMMUNOTHERAPY ALERT CARD at  check-in to the Emergency Department and triage nurse.  Should you have questions after your visit or need to cancel or reschedule your appointment, please contact [facility]  Dept: [phone]  and follow the prompts.  Office hours are 8:00 a.m. to 4:30 p.m. Monday - Friday. Please note that voicemails left after 4:00 p.m. may not be returned until the following business day.  We are closed weekends and major holidays. You have access to a nurse at all times for urgent questions. Please call the main number to the clinic Dept: [phone] and follow the prompts.   For any non-urgent questions, you may also contact your provider using MyChart. We now offer e-Visits for anyone 30 and older to request care online for non-urgent symptoms. For details visit [email or web address].   Also download the MyChart app! Go to the app store, search "MyChart", open the app, select [facility], and log in with your MyChart username and password.  Due to Covid, a mask is required upon entering the hospital/clinic. If you do not have a mask, one will be given to you upon arrival. For doctor visits, patients may have 1 support person aged 31 or older with them. For treatment visits, patients cannot have anyone with them due to current Covid guidelines and our immunocompromised population.

## 2021-01-04 NOTE — Progress Notes (Signed)
[place] [phone]   [phone]  OFFICE PROGRESS NOTE  Koirala, Dibas, MD [address] [place] [address]  DIAGNOSIS: Stage IV (T2a, N2, M1b) non-small cell lung cancer, adenocarcinoma diagnosed in May 2022 and presented with left lower lobe lung mass in addition to left hilar and mediastinal lymphadenopathy as well as bilateral pulmonary nodules and metastatic disease to the ribs bilaterally. 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  PRIOR THERAPY: None  CURRENT THERAPY: Systemic chemotherapy with carboplatin for AUC of 5, Alimta 500 Mg/M2 and Keytruda 200 Mg IV every 3 weeks.  Started at [facility] on 10/13/2020. Status post 4 cycles.  Starting from cycle #5, the patient will be on maintenance treatment with Alimta and Keytruda every 3 weeks.  INTERVAL HISTORY: Tiffany Owen 60 y.o. female returns to the clinic today for follow-up visit.  She was accompanied by her daughter.  The patient is feeling fine today with no concerning complaints except for the baseline shortness of breath and she is currently on home oxygen.  She denied having any chest pain, cough or hemoptysis.  She has no nausea, vomiting, diarrhea or constipation.  She has no headache or visual changes.  She denied having any significant weight loss or night sweats.  The patient continues to tolerate her systemic chemotherapy fairly well.  She is here today for evaluation before starting cycle #5 and the first cycle of her maintenance therapy with Alimta and Keytruda.   MEDICAL HISTORY: Past Medical History:  Diagnosis Date   Cancer [facility])    CHF (congestive heart failure) (HCC)    COPD (chronic obstructive pulmonary disease) ([place])    Family history of lung cancer 10/29/2020   Hypertension    Pneumonia 04/17/2018    ALLERGIES:  has No Known Allergies.  MEDICATIONS:  Current Outpatient  Medications  Medication Sig Dispense Refill   acetaminophen (TYLENOL) 500 MG tablet Take 2 tablets (1,000 mg total) by mouth every 8 (eight) hours. 30 tablet 0   budesonide (PULMICORT) 0.5 MG/2ML nebulizer solution Take 0.5 mg by nebulization 2 (two) times daily as needed (Asthma).     carvedilol (COREG) 12.5 MG tablet Take 12.5 mg by mouth 2 (two) times daily with a meal.     Cholecalciferol (VITAMIN D3 PO) Take 1 capsule by mouth daily.     Cyanocobalamin (VITAMIN B-12 PO) Take 1 tablet by mouth daily.     folic acid (FOLVITE) 1 MG tablet Take 1 tablet (1 mg total) by mouth See admin instructions. Take 1 mg daily by mouth (Patient taking differently: Take 1 mg by mouth daily. Take 1 mg daily by mouth) 30 tablet 2   hydrochlorothiazide (HYDRODIURIL) 25 MG tablet Take 25 mg by mouth daily.     ipratropium-albuterol (DUONEB) 0.5-2.5 (3) MG/3ML SOLN Take 3 mLs by nebulization every 6 (six) hours as needed. (Patient taking differently: Take 3 mLs by nebulization every 6 (six) hours as needed (Asthma).) 360 mL 1   lidocaine (LIDODERM) 5 % Place 1 patch onto the skin daily. Remove & Discard patch within 12 hours or as directed by MD 30 patch 0   lidocaine-prilocaine (EMLA) cream Apply 1 application topically as needed (for port access). 30 g 0   LORazepam (ATIVAN) 0.5 MG tablet One tab po 30 min before MRI. Repeat once if needed. 2 tablet 0   metFORMIN (GLUCOPHAGE-XR) 500 MG  24 hr tablet Take 500 mg by mouth daily with breakfast.     Multiple Vitamins-Minerals (ONE-A-DAY WOMENS PO) Take 1 tablet by mouth daily with breakfast.     oxyCODONE (OXY IR/ROXICODONE) 5 MG immediate release tablet Take 1 tablet (5 mg total) by mouth every 8 (eight) hours as needed for moderate pain. 20 tablet 0   OXYGEN Inhale 3 L/min into the lungs continuous.     prochlorperazine (COMPAZINE) 10 MG tablet Take 10 mg by mouth every 6 (six) hours as needed for nausea or vomiting.     Tiotropium Bromide-Olodaterol 2.5-2.5 MCG/ACT  AERS Inhale 2 puffs into the lungs daily. 4 g 11   No current facility-administered medications for this visit.    SURGICAL HISTORY:  Past Surgical History:  Procedure Laterality Date   CESAREAN SECTION  1979; 1981; 1992   IR IMAGING GUIDED PORT INSERTION  11/12/2020   TUBAL LIGATION  1992    REVIEW OF SYSTEMS:  A comprehensive review of systems was negative except for: Respiratory: positive for dyspnea on exertion   PHYSICAL EXAMINATION: General appearance: alert, cooperative, fatigued, and no distress Head: Normocephalic, without obvious abnormality, atraumatic Neck: no adenopathy, no JVD, supple, symmetrical, trachea midline, and thyroid not enlarged, symmetric, no tenderness/mass/nodules Lymph nodes: Cervical, supraclavicular, and axillary nodes normal. Resp: clear to auscultation bilaterally Back: symmetric, no curvature. ROM normal. No CVA tenderness. Cardio: regular rate and rhythm, S1, S2 normal, no murmur, click, rub or gallop GI: soft, non-tender; bowel sounds normal; no masses,  no organomegaly Extremities: extremities normal, atraumatic, no cyanosis or edema  ECOG PERFORMANCE STATUS: 1 - Symptomatic but completely ambulatory  Blood pressure (!) 141/96, pulse 83, temperature 97.6 F (36.4 C), temperature source Tympanic, resp. rate 20, height $RemoveBe'4\' 9"'[id number]$  (1.448 m), weight 266 lb 3.2 oz (120.7 kg), SpO2 100 %.  LABORATORY DATA: Lab Results  Component Value Date   WBC 4.5 01/04/2021   HGB 11.0 (L) 01/04/2021   HCT 34.6 (L) 01/04/2021   MCV 99.7 01/04/2021   PLT 271 01/04/2021      Chemistry      Component Value Date/Time   NA 142 12/28/2020 0914   K 3.9 12/28/2020 0914   CL 102 12/28/2020 0914   CO2 28 12/28/2020 0914   BUN 9 12/28/2020 0914   CREATININE 0.78 12/28/2020 0914      Component Value Date/Time   CALCIUM 9.8 12/28/2020 0914   ALKPHOS 94 12/28/2020 0914   AST 48 (H) 12/28/2020 0914   ALT 54 (H) 12/28/2020 0914   BILITOT 0.2 (L) 12/28/2020 0914        RADIOGRAPHIC STUDIES: CT Chest W Contrast  Result Date: 12/11/2020 CLINICAL DATA:  Primary Cancer Type: Lung Imaging Indication: Assess response to therapy Interval therapy since last imaging? Yes Initial Cancer Diagnosis Date: 09/02/2020; Established by: Biopsy-proven Detailed Pathology: Stage IV non-small cell lung cancer, adenocarcinoma. Primary Tumor location: Left lower lobe. Metastatic disease to the ribs. Surgeries: No. Chemotherapy: Yes; Ongoing? Yes; Most recent administration: 11/23/2020 Immunotherapy?  Yes; Type: Keytruda; Ongoing? Yes Radiation therapy? No EXAM: CT CHEST, ABDOMEN, AND PELVIS WITH CONTRAST TECHNIQUE: Multidetector CT imaging of the chest, abdomen and pelvis was performed following the standard protocol during bolus administration of intravenous contrast. CONTRAST:  198mL OMNIPAQUE IOHEXOL 350 MG/ML SOLN, additional oral enteric contrast COMPARISON:  CT chest angiogram, 10/18/2020, outside PET-CT, 07/24/2020 FINDINGS: CT CHEST FINDINGS Cardiovascular: Right chest port catheter. Aortic atherosclerosis. Normal heart size. No pericardial effusion. Mediastinum/Nodes: No enlarged mediastinal, hilar, or axillary lymph  nodes. Thyroid gland, trachea, and esophagus demonstrate no significant findings. Lungs/Pleura: Moderate centrilobular emphysema. Diffuse bilateral bronchial wall thickening. There has been nearly complete resolution of a previously seen subpleural mass of the medial left lower lobe overlying the aorta, now measuring no greater than 1.0 x 0.5 cm, previously 3.6 x 3.0 cm when measured similarly (series 4, image 73). Interval decrease in size of a left apical pulmonary nodule, measuring 1.2 x 0.9 cm, previously 1.4 x 1.4 cm (series 4, image 26). Interval decrease in size of a pulmonary nodule of the left upper lobe, measuring 0.5 cm, previously 0.8 cm (series 4, image 43). Interval decrease in size of a small nodule of the anterior right middle lobe, and measuring no  greater than 0.3 cm, previously 0.4 cm (series 4, image 90). Other small nodules are completely resolved, for example a previously noted nodules in the dependent right lower lobe (series 4, image 96). Bandlike scarring of the anterior left lower lobe (series 4, image 86). No pleural effusion or pneumothorax. Musculoskeletal: No chest wall mass. Interval sclerosis of a previously seen lytic lesion of the left fifth rib (series 2, image 24), left eighth rib (series 2, image 41), right second rib (series 2, image 12), and right fourth rib (series 2, image 25). CT ABDOMEN PELVIS FINDINGS Hepatobiliary: No solid liver abnormality is seen. Hepatic steatosis. No gallstones, gallbladder wall thickening, or biliary dilatation. Pancreas: Unremarkable. No pancreatic ductal dilatation or surrounding inflammatory changes. Spleen: Normal in size. Evaluation of the spleen is somewhat limited by breath motion artifact and single phase of contrast, within this limitation, a hypodense lesion of the spleen is slightly diminished in size, measuring approximately 1.1 cm, previously 1.6 cm (series 2, image 53). Adrenals/Urinary Tract: Adrenal glands are unremarkable. Kidneys are normal, without renal calculi, solid lesion, or hydronephrosis. Bladder is unremarkable. Stomach/Bowel: Stomach is within normal limits. Appendix appears normal. No evidence of bowel wall thickening, distention, or inflammatory changes. Pancolonic diverticulosis. Vascular/Lymphatic: Aortic atherosclerosis. Unchanged prominent bilateral iliac and inguinal lymph nodes, not previously FDG PET avid (series 2, image 89, 103). Reproductive: Uterine fibroids. Other: No abdominal wall hernia or abnormality. No abdominopelvic ascites. Musculoskeletal: No acute findings. Sclerotic lesion of the right pubic symphysis (series 2, image 102). IMPRESSION: 1. There has been nearly complete resolution of a previously seen subpleural mass of the medial left lower lobe overlying  the aorta. 2. Interval decrease in size of multiple bilateral pulmonary nodules, in addition to complete resolution of other previously seen small nodules. 3. Interval post treatment sclerosis of multiple bilateral previously seen lytic rib lesions as well as a sclerotic lesion of the right pubic symphysis. 4. Evaluation of the spleen is somewhat limited by breath motion artifact and single phase of contrast, within this limitation, a hypodense lesion of the spleen appears to be slightly diminished in size, and may reflect an improved metastasis, although is incompletely characterized. 5. Overall constellation of findings is consistent with treatment response of primary lung malignancy and metastatic disease. Aortic Atherosclerosis (ICD10-I70.0). Electronically Signed   By: Eddie Candle M.D.   On: 12/11/2020 11:12   CT Abdomen Pelvis W Contrast  Result Date: 12/11/2020 CLINICAL DATA:  Primary Cancer Type: Lung Imaging Indication: Assess response to therapy Interval therapy since last imaging? Yes Initial Cancer Diagnosis Date: 09/02/2020; Established by: Biopsy-proven Detailed Pathology: Stage IV non-small cell lung cancer, adenocarcinoma. Primary Tumor location: Left lower lobe. Metastatic disease to the ribs. Surgeries: No. Chemotherapy: Yes; Ongoing? Yes; Most recent administration: 11/23/2020 Immunotherapy?  Yes; Type: Keytruda; Ongoing? Yes Radiation therapy? No EXAM: CT CHEST, ABDOMEN, AND PELVIS WITH CONTRAST TECHNIQUE: Multidetector CT imaging of the chest, abdomen and pelvis was performed following the standard protocol during bolus administration of intravenous contrast. CONTRAST:  129m OMNIPAQUE IOHEXOL 350 MG/ML SOLN, additional oral enteric contrast COMPARISON:  CT chest angiogram, 10/18/2020, outside PET-CT, 07/24/2020 FINDINGS: CT CHEST FINDINGS Cardiovascular: Right chest port catheter. Aortic atherosclerosis. Normal heart size. No pericardial effusion. Mediastinum/Nodes: No enlarged  mediastinal, hilar, or axillary lymph nodes. Thyroid gland, trachea, and esophagus demonstrate no significant findings. Lungs/Pleura: Moderate centrilobular emphysema. Diffuse bilateral bronchial wall thickening. There has been nearly complete resolution of a previously seen subpleural mass of the medial left lower lobe overlying the aorta, now measuring no greater than 1.0 x 0.5 cm, previously 3.6 x 3.0 cm when measured similarly (series 4, image 73). Interval decrease in size of a left apical pulmonary nodule, measuring 1.2 x 0.9 cm, previously 1.4 x 1.4 cm (series 4, image 26). Interval decrease in size of a pulmonary nodule of the left upper lobe, measuring 0.5 cm, previously 0.8 cm (series 4, image 43). Interval decrease in size of a small nodule of the anterior right middle lobe, and measuring no greater than 0.3 cm, previously 0.4 cm (series 4, image 90). Other small nodules are completely resolved, for example a previously noted nodules in the dependent right lower lobe (series 4, image 96). Bandlike scarring of the anterior left lower lobe (series 4, image 86). No pleural effusion or pneumothorax. Musculoskeletal: No chest wall mass. Interval sclerosis of a previously seen lytic lesion of the left fifth rib (series 2, image 24), left eighth rib (series 2, image 41), right second rib (series 2, image 12), and right fourth rib (series 2, image 25). CT ABDOMEN PELVIS FINDINGS Hepatobiliary: No solid liver abnormality is seen. Hepatic steatosis. No gallstones, gallbladder wall thickening, or biliary dilatation. Pancreas: Unremarkable. No pancreatic ductal dilatation or surrounding inflammatory changes. Spleen: Normal in size. Evaluation of the spleen is somewhat limited by breath motion artifact and single phase of contrast, within this limitation, a hypodense lesion of the spleen is slightly diminished in size, measuring approximately 1.1 cm, previously 1.6 cm (series 2, image 53). Adrenals/Urinary Tract:  Adrenal glands are unremarkable. Kidneys are normal, without renal calculi, solid lesion, or hydronephrosis. Bladder is unremarkable. Stomach/Bowel: Stomach is within normal limits. Appendix appears normal. No evidence of bowel wall thickening, distention, or inflammatory changes. Pancolonic diverticulosis. Vascular/Lymphatic: Aortic atherosclerosis. Unchanged prominent bilateral iliac and inguinal lymph nodes, not previously FDG PET avid (series 2, image 89, 103). Reproductive: Uterine fibroids. Other: No abdominal wall hernia or abnormality. No abdominopelvic ascites. Musculoskeletal: No acute findings. Sclerotic lesion of the right pubic symphysis (series 2, image 102). IMPRESSION: 1. There has been nearly complete resolution of a previously seen subpleural mass of the medial left lower lobe overlying the aorta. 2. Interval decrease in size of multiple bilateral pulmonary nodules, in addition to complete resolution of other previously seen small nodules. 3. Interval post treatment sclerosis of multiple bilateral previously seen lytic rib lesions as well as a sclerotic lesion of the right pubic symphysis. 4. Evaluation of the spleen is somewhat limited by breath motion artifact and single phase of contrast, within this limitation, a hypodense lesion of the spleen appears to be slightly diminished in size, and may reflect an improved metastasis, although is incompletely characterized. 5. Overall constellation of findings is consistent with treatment response of primary lung malignancy and metastatic disease. Aortic Atherosclerosis (  ICD10-I70.0). Electronically Signed   By: Eddie Candle M.D.   On: 12/11/2020 11:12    ASSESSMENT AND PLAN: This is a very pleasant 60 years old African-American female diagnosed with Stage IV (T2a, N2, M1b) non-small cell lung cancer, adenocarcinoma diagnosed in May 2022 and presented with left lower lobe lung mass in addition to left hilar and mediastinal lymphadenopathy as well as  bilateral pulmonary nodules and metastatic disease to the ribs bilaterally. 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 The patient is currently undergoing systemic chemotherapy with carboplatin for AUC of 5, Alimta 500 Mg/M2 and Keytruda 200 Mg IV every 3 weeks status post 4 cycle.  First cycle of her treatment was started at [facility] in [place].  Starting from cycle #5 the patient will be on maintenance treatment with Alimta and Keytruda every 3 weeks. The patient tolerated the last cycle of her treatment fairly well.  I recommended for her to proceed with cycle #5 today as planned. I will see her back for follow-up visit in 3 weeks for evaluation before starting cycle #6. For the COPD and shortness of breath, she will continue on home oxygen. The patient was advised to call immediately if she has any other concerning symptoms in the interval. The patient voices understanding of current disease status and treatment options and is in agreement with the current care plan.  All questions were answered. The patient knows to call the clinic with any problems, questions or concerns. We can certainly see the patient much sooner if necessary.   Disclaimer: This note was dictated with voice recognition software. Similar sounding words can inadvertently be transcribed and may not be corrected upon review.

## 2021-01-04 NOTE — Telephone Encounter (Signed)
Scheduled appt per 9/19 los - patient to get an updated schedule in infusion

## 2021-01-17 DIAGNOSIS — R0902 Hypoxemia: Secondary | ICD-10-CM | POA: Diagnosis not present

## 2021-01-21 DIAGNOSIS — J449 Chronic obstructive pulmonary disease, unspecified: Secondary | ICD-10-CM | POA: Diagnosis not present

## 2021-01-21 DIAGNOSIS — J9610 Chronic respiratory failure, unspecified whether with hypoxia or hypercapnia: Secondary | ICD-10-CM | POA: Diagnosis not present

## 2021-01-24 DIAGNOSIS — I5033 Acute on chronic diastolic (congestive) heart failure: Secondary | ICD-10-CM | POA: Diagnosis not present

## 2021-01-25 ENCOUNTER — Inpatient Hospital Stay: Payer: BC Managed Care – PPO

## 2021-01-25 ENCOUNTER — Inpatient Hospital Stay: Payer: BC Managed Care – PPO | Attending: Internal Medicine | Admitting: Internal Medicine

## 2021-01-25 ENCOUNTER — Other Ambulatory Visit: Payer: Self-pay

## 2021-01-25 ENCOUNTER — Other Ambulatory Visit: Payer: BC Managed Care – PPO

## 2021-01-25 VITALS — BP 156/99 | HR 106 | Temp 96.6°F | Resp 21 | Ht <= 58 in | Wt 267.8 lb

## 2021-01-25 VITALS — HR 88

## 2021-01-25 DIAGNOSIS — Z5111 Encounter for antineoplastic chemotherapy: Secondary | ICD-10-CM | POA: Insufficient documentation

## 2021-01-25 DIAGNOSIS — C3492 Malignant neoplasm of unspecified part of left bronchus or lung: Secondary | ICD-10-CM

## 2021-01-25 DIAGNOSIS — J449 Chronic obstructive pulmonary disease, unspecified: Secondary | ICD-10-CM | POA: Insufficient documentation

## 2021-01-25 DIAGNOSIS — Z801 Family history of malignant neoplasm of trachea, bronchus and lung: Secondary | ICD-10-CM | POA: Insufficient documentation

## 2021-01-25 DIAGNOSIS — Z79899 Other long term (current) drug therapy: Secondary | ICD-10-CM | POA: Diagnosis not present

## 2021-01-25 DIAGNOSIS — E538 Deficiency of other specified B group vitamins: Secondary | ICD-10-CM | POA: Insufficient documentation

## 2021-01-25 DIAGNOSIS — Z87891 Personal history of nicotine dependence: Secondary | ICD-10-CM | POA: Diagnosis not present

## 2021-01-25 DIAGNOSIS — I110 Hypertensive heart disease with heart failure: Secondary | ICD-10-CM | POA: Insufficient documentation

## 2021-01-25 DIAGNOSIS — C7951 Secondary malignant neoplasm of bone: Secondary | ICD-10-CM | POA: Diagnosis not present

## 2021-01-25 DIAGNOSIS — I509 Heart failure, unspecified: Secondary | ICD-10-CM | POA: Insufficient documentation

## 2021-01-25 DIAGNOSIS — Z9981 Dependence on supplemental oxygen: Secondary | ICD-10-CM | POA: Diagnosis not present

## 2021-01-25 DIAGNOSIS — Z95828 Presence of other vascular implants and grafts: Secondary | ICD-10-CM

## 2021-01-25 DIAGNOSIS — C3490 Malignant neoplasm of unspecified part of unspecified bronchus or lung: Secondary | ICD-10-CM

## 2021-01-25 DIAGNOSIS — Z5112 Encounter for antineoplastic immunotherapy: Secondary | ICD-10-CM | POA: Insufficient documentation

## 2021-01-25 LAB — CMP (CANCER CENTER ONLY)
ALT: 59 U/L — ABNORMAL HIGH (ref 0–44)
AST: 36 U/L (ref 15–41)
Albumin: 3.6 g/dL (ref 3.5–5.0)
Alkaline Phosphatase: 96 U/L (ref 38–126)
Anion gap: 11 (ref 5–15)
BUN: 12 mg/dL (ref 8–23)
CO2: 26 mmol/L (ref 22–32)
Calcium: 9.5 mg/dL (ref 8.9–10.3)
Chloride: 106 mmol/L (ref 98–111)
Creatinine: 0.84 mg/dL (ref 0.44–1.00)
GFR, Estimated: >60 mL/min (ref >60)
Glucose, Bld: 95 mg/dL (ref 70–99)
Potassium: 4.2 mmol/L (ref 3.5–5.1)
Sodium: 143 mmol/L (ref 135–145)
Total Bilirubin: 0.3 mg/dL (ref 0.3–1.2)
Total Protein: 7.5 g/dL (ref 6.5–8.1)

## 2021-01-25 LAB — CBC WITH DIFFERENTIAL (CANCER CENTER ONLY)
Abs Immature Granulocytes: 0.02 10*3/uL (ref 0.00–0.07)
Basophils Absolute: 0 10*3/uL (ref 0.0–0.1)
Basophils Relative: 0 %
Eosinophils Absolute: 0.2 10*3/uL (ref 0.0–0.5)
Eosinophils Relative: 2 %
HCT: 37.2 % (ref 36.0–46.0)
Hemoglobin: 11.9 g/dL — ABNORMAL LOW (ref 12.0–15.0)
Immature Granulocytes: 1 %
Lymphocytes Relative: 32 %
Lymphs Abs: 2.4 10*3/uL (ref 0.7–4.0)
MCH: 33.1 pg (ref 26.0–34.0)
MCHC: 32 g/dL (ref 30.0–36.0)
MCV: 103.6 fL — ABNORMAL HIGH (ref 80.0–100.0)
Monocytes Absolute: 1.3 10*3/uL — ABNORMAL HIGH (ref 0.1–1.0)
Monocytes Relative: 18 %
Neutro Abs: 3.5 10*3/uL (ref 1.7–7.7)
Neutrophils Relative %: 46 %
Platelet Count: 389 10*3/uL (ref 150–400)
RBC: 3.59 MIL/uL — ABNORMAL LOW (ref 3.87–5.11)
RDW: 19.5 % — ABNORMAL HIGH (ref 11.5–15.5)
WBC Count: 7.5 10*3/uL (ref 4.0–10.5)
nRBC: 0 % (ref 0.0–0.2)

## 2021-01-25 LAB — TSH: TSH: 1.331 u[IU]/mL (ref 0.308–3.960)

## 2021-01-25 MED ORDER — SODIUM CHLORIDE 0.9% FLUSH
10.0000 mL | INTRAVENOUS | Status: DC | PRN
  Administered 2021-01-25: 10 mL

## 2021-01-25 MED ORDER — SODIUM CHLORIDE 0.9 % IV SOLN: Freq: Once | INTRAVENOUS | Status: AC

## 2021-01-25 MED ORDER — SODIUM CHLORIDE 0.9 % IV SOLN
500.0000 mg/m2 | Freq: Once | INTRAVENOUS | Status: AC
  Administered 2021-01-25: 1100 mg via INTRAVENOUS
  Filled 2021-01-25: qty 40

## 2021-01-25 MED ORDER — HEPARIN SOD (PORK) LOCK FLUSH 100 UNIT/ML IV SOLN
500.0000 [IU] | Freq: Once | INTRAVENOUS | Status: AC | PRN
  Administered 2021-01-25: 500 [IU]

## 2021-01-25 MED ORDER — SODIUM CHLORIDE 0.9 % IV SOLN
200.0000 mg | Freq: Once | INTRAVENOUS | Status: AC
Start: 2021-01-25 — End: 2021-01-25
  Administered 2021-01-25: 200 mg via INTRAVENOUS
  Filled 2021-01-25: qty 8

## 2021-01-25 MED ORDER — PROCHLORPERAZINE MALEATE 10 MG PO TABS
10.0000 mg | ORAL_TABLET | Freq: Once | ORAL | Status: AC
  Administered 2021-01-25: 10 mg via ORAL
  Filled 2021-01-25: qty 1

## 2021-01-25 NOTE — Patient Instructions (Addendum)
[facility] ONCOL[facility]  Discharge Instructions: Thank you for choosing [facility] to provide your oncology and hematology care.   If you have a lab appointment with the [facility], please go directly to the [facility] and check in at the registration area.   Wear comfortable clothing and clothing appropriate for easy access to any Portacath or PICC line.   We strive to give you quality time with your provider. You may need to reschedule your appointment if you arrive late (15 or more minutes).  Arriving late affects you and other patients whose appointments are after yours.  Also, if you miss three or more appointments without notifying the office, you may be dismissed from the clinic at the provider's discretion.      For prescription refill requests, have your pharmacy contact our office and allow 72 hours for refills to be completed.    Today you received the following chemotherapy and/or immunotherapy agents: Keytruda and Alimta   To help prevent nausea and vomiting after your treatment, we encourage you to take your nausea medication as directed.  BELOW ARE SYMPTOMS THAT SHOULD BE REPORTED IMMEDIATELY: *FEVER GREATER THAN 100.4 F (38 C) OR HIGHER *CHILLS OR SWEATING *NAUSEA AND VOMITING THAT IS NOT CONTROLLED WITH YOUR NAUSEA MEDICATION *UNUSUAL SHORTNESS OF BREATH *UNUSUAL BRUISING OR BLEEDING *URINARY PROBLEMS (pain or burning when urinating, or frequent urination) *BOWEL PROBLEMS (unusual diarrhea, constipation, pain near the anus) TENDERNESS IN MOUTH AND THROAT WITH OR WITHOUT PRESENCE OF ULCERS (sore throat, sores in mouth, or a toothache) UNUSUAL RASH, SWELLING OR PAIN  UNUSUAL VAGINAL DISCHARGE OR ITCHING   Items with * indicate a potential emergency and should be followed up as soon as possible or go to the Emergency Department if any problems should occur.  Please show the CHEMOTHERAPY ALERT CARD or IMMUNOTHERAPY ALERT CARD at  check-in to the Emergency Department and triage nurse.  Should you have questions after your visit or need to cancel or reschedule your appointment, please contact [facility]  Dept: [phone]  and follow the prompts.  Office hours are 8:00 a.m. to 4:30 p.m. Monday - Friday. Please note that voicemails left after 4:00 p.m. may not be returned until the following business day.  We are closed weekends and major holidays. You have access to a nurse at all times for urgent questions. Please call the main number to the clinic Dept: [phone] and follow the prompts.   For any non-urgent questions, you may also contact your provider using MyChart. We now offer e-Visits for anyone 30 and older to request care online for non-urgent symptoms. For details visit [email or web address].   Also download the MyChart app! Go to the app store, search "MyChart", open the app, select [facility], and log in with your MyChart username and password.  Due to Covid, a mask is required upon entering the hospital/clinic. If you do not have a mask, one will be given to you upon arrival. For doctor visits, patients may have 1 support person aged 57 or older with them. For treatment visits, patients cannot have anyone with them due to current Covid guidelines and our immunocompromised population.

## 2021-01-25 NOTE — Progress Notes (Signed)
[facility] [phone]   Fax:([phone]  OFFICE PROGRESS NOTE  Koirala, Dibas, MD [address] [place] [address]  DIAGNOSIS: Stage IV (T2a, N2, M1b) non-small cell lung cancer, adenocarcinoma diagnosed in May 2022 and presented with left lower lobe lung mass in addition to left hilar and mediastinal lymphadenopathy as well as bilateral pulmonary nodules and metastatic disease to the ribs bilaterally. 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  PRIOR THERAPY: None  CURRENT THERAPY: Systemic chemotherapy with carboplatin for AUC of 5, Alimta 500 Mg/M2 and Keytruda 200 Mg IV every 3 weeks.  Started at [facility] on 10/13/2020. Status post 5 cycles.  Starting from cycle #5, the patient will be on maintenance treatment with Alimta and Keytruda every 3 weeks.  INTERVAL HISTORY: Tiffany Owen 60 y.o. female returns to the clinic today for follow-up visit accompanied by her daughter.  The patient is feeling fine today with no concerning complaints.  She denied having any current chest pain, but has baseline shortness of breath and she is currently on home oxygen.  She has no cough or hemoptysis.  She has no nausea, vomiting, diarrhea but occasional constipation.  She denied having any headache or visual changes.  She has no bleeding issues.  The patient is here today for evaluation before starting cycle #6 of her treatment.   MEDICAL HISTORY: Past Medical History:  Diagnosis Date   Cancer [facility])    CHF (congestive heart failure) (HCC)    COPD (chronic obstructive pulmonary disease) ([place])    Family history of lung cancer 10/29/2020   Hypertension    Pneumonia 04/17/2018    ALLERGIES:  has No Known Allergies.  MEDICATIONS:  Current Outpatient Medications  Medication Sig Dispense Refill   acetaminophen (TYLENOL) 500 MG tablet Take 2 tablets (1,000 mg total) by mouth  every 8 (eight) hours. 30 tablet 0   budesonide (PULMICORT) 0.5 MG/2ML nebulizer solution Take 0.5 mg by nebulization 2 (two) times daily as needed (Asthma).     carvedilol (COREG) 12.5 MG tablet Take 12.5 mg by mouth 2 (two) times daily with a meal.     Cholecalciferol (VITAMIN D3 PO) Take 1 capsule by mouth daily.     Cyanocobalamin (VITAMIN B-12 PO) Take 1 tablet by mouth daily.     folic acid (FOLVITE) 1 MG tablet Take 1 tablet (1 mg total) by mouth See admin instructions. Take 1 mg daily by mouth (Patient taking differently: Take 1 mg by mouth daily. Take 1 mg daily by mouth) 30 tablet 2   hydrochlorothiazide (HYDRODIURIL) 25 MG tablet Take 25 mg by mouth daily.     ipratropium-albuterol (DUONEB) 0.5-2.5 (3) MG/3ML SOLN Take 3 mLs by nebulization every 6 (six) hours as needed. (Patient taking differently: Take 3 mLs by nebulization every 6 (six) hours as needed (Asthma).) 360 mL 1   lidocaine (LIDODERM) 5 % Place 1 patch onto the skin daily. Remove & Discard patch within 12 hours or as directed by MD 30 patch 0   lidocaine-prilocaine (EMLA) cream Apply 1 application topically as needed (for port access). 30 g 0   LORazepam (ATIVAN) 0.5 MG tablet One tab po 30 min before MRI. Repeat once if needed. 2 tablet 0   metFORMIN (GLUCOPHAGE-XR) 500 MG 24 hr tablet Take 500 mg by mouth daily with breakfast.     Multiple Vitamins-Minerals (ONE-A-DAY WOMENS PO) Take  1 tablet by mouth daily with breakfast.     oxyCODONE (OXY IR/ROXICODONE) 5 MG immediate release tablet Take 1 tablet (5 mg total) by mouth every 8 (eight) hours as needed for moderate pain. 20 tablet 0   OXYGEN Inhale 3 L/min into the lungs continuous.     prochlorperazine (COMPAZINE) 10 MG tablet Take 10 mg by mouth every 6 (six) hours as needed for nausea or vomiting.     Tiotropium Bromide-Olodaterol 2.5-2.5 MCG/ACT AERS Inhale 2 puffs into the lungs daily. 4 g 11   No current facility-administered medications for this visit.     SURGICAL HISTORY:  Past Surgical History:  Procedure Laterality Date   CESAREAN SECTION  1979; 1981; 1992   IR IMAGING GUIDED PORT INSERTION  11/12/2020   TUBAL LIGATION  1992    REVIEW OF SYSTEMS:  A comprehensive review of systems was negative except for: Respiratory: positive for dyspnea on exertion   PHYSICAL EXAMINATION: General appearance: alert, cooperative, fatigued, and no distress Head: Normocephalic, without obvious abnormality, atraumatic Neck: no adenopathy, no JVD, supple, symmetrical, trachea midline, and thyroid not enlarged, symmetric, no tenderness/mass/nodules Lymph nodes: Cervical, supraclavicular, and axillary nodes normal. Resp: clear to auscultation bilaterally Back: symmetric, no curvature. ROM normal. No CVA tenderness. Cardio: regular rate and rhythm, S1, S2 normal, no murmur, click, rub or gallop GI: soft, non-tender; bowel sounds normal; no masses,  no organomegaly Extremities: extremities normal, atraumatic, no cyanosis or edema  ECOG PERFORMANCE STATUS: 1 - Symptomatic but completely ambulatory  Blood pressure (!) 156/99, pulse (!) 106, temperature (!) 96.6 F (35.9 C), temperature source Tympanic, resp. rate (!) 21, height 4' 9" (1.448 m), weight 267 lb 12.8 oz (121.5 kg), SpO2 100 %.  LABORATORY DATA: Lab Results  Component Value Date   WBC 3.0 (L) 01/25/2021   HGB 5.0 (LL) 01/25/2021   HCT 16.4 (L) 01/25/2021   MCV 108.6 (H) 01/25/2021   PLT 157 01/25/2021      Chemistry      Component Value Date/Time   NA 144 01/04/2021 0856   K 4.1 01/04/2021 0856   CL 104 01/04/2021 0856   CO2 28 01/04/2021 0856   BUN 11 01/04/2021 0856   CREATININE 0.80 01/04/2021 0856      Component Value Date/Time   CALCIUM 9.9 01/04/2021 0856   ALKPHOS 91 01/04/2021 0856   AST 45 (H) 01/04/2021 0856   ALT 53 (H) 01/04/2021 0856   BILITOT <0.2 (L) 01/04/2021 0856       RADIOGRAPHIC STUDIES: No results found.  ASSESSMENT AND PLAN: This is a very  pleasant 60 years old African-American female diagnosed with Stage IV (T2a, N2, M1b) non-small cell lung cancer, adenocarcinoma diagnosed in May 2022 and presented with left lower lobe lung mass in addition to left hilar and mediastinal lymphadenopathy as well as bilateral pulmonary nodules and metastatic disease to the ribs bilaterally. 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 The patient is currently undergoing systemic chemotherapy with carboplatin for AUC of 5, Alimta 500 Mg/M2 and Keytruda 200 Mg IV every 3 weeks status post 4 cycle.  First cycle of her treatment was started at [facility] in [place].  Starting from cycle #5 the patient will be on maintenance treatment with Alimta and Keytruda every 3 weeks. She tolerated the last cycle of her treatment well with no concerning adverse effects. CBC performed earlier today showed hemoglobin was down to 5.0  but this was a lab error and repeat CBC done peripheral showed a corrected hemoglobin of 11.9. I recommended for the patient to proceed with cycle #6 today as planned. I will see her back for follow-up visit in 3 weeks for evaluation with repeat CT scan of the chest, abdomen pelvis for restaging of her disease. For the COPD and shortness of breath, she will continue on home oxygen. The patient was advised to call immediately if she has any concerning symptoms in the interval. The patient voices understanding of current disease status and treatment options and is in agreement with the current care plan.  All questions were answered. The patient knows to call the clinic with any problems, questions or concerns. We can certainly see the patient much sooner if necessary.   Disclaimer: This note was dictated with voice recognition software. Similar sounding words can inadvertently be transcribed and may not be corrected upon review.

## 2021-01-26 DIAGNOSIS — C3490 Malignant neoplasm of unspecified part of unspecified bronchus or lung: Secondary | ICD-10-CM | POA: Diagnosis not present

## 2021-01-26 DIAGNOSIS — J449 Chronic obstructive pulmonary disease, unspecified: Secondary | ICD-10-CM | POA: Diagnosis not present

## 2021-01-26 DIAGNOSIS — G4733 Obstructive sleep apnea (adult) (pediatric): Secondary | ICD-10-CM | POA: Diagnosis not present

## 2021-01-26 DIAGNOSIS — J9611 Chronic respiratory failure with hypoxia: Secondary | ICD-10-CM | POA: Diagnosis not present

## 2021-01-28 ENCOUNTER — Other Ambulatory Visit: Payer: BC Managed Care – PPO | Admitting: Hospice

## 2021-02-15 ENCOUNTER — Inpatient Hospital Stay (HOSPITAL_BASED_OUTPATIENT_CLINIC_OR_DEPARTMENT_OTHER): Payer: BC Managed Care – PPO | Admitting: Internal Medicine

## 2021-02-15 ENCOUNTER — Inpatient Hospital Stay: Payer: BC Managed Care – PPO

## 2021-02-15 ENCOUNTER — Encounter: Payer: Self-pay | Admitting: Internal Medicine

## 2021-02-15 ENCOUNTER — Ambulatory Visit (HOSPITAL_COMMUNITY)
Admission: RE | Admit: 2021-02-15 | Discharge: 2021-02-15 | Disposition: A | Discharge status: Home / Self Care | Payer: BC Managed Care – PPO | Source: Ambulatory Visit | Attending: Internal Medicine | Admitting: Internal Medicine

## 2021-02-15 ENCOUNTER — Encounter (HOSPITAL_COMMUNITY): Payer: Self-pay

## 2021-02-15 ENCOUNTER — Other Ambulatory Visit: Payer: Self-pay

## 2021-02-15 VITALS — BP 152/79 | HR 94 | Temp 97.1°F | Resp 20 | Ht <= 58 in | Wt 273.7 lb

## 2021-02-15 DIAGNOSIS — Z5112 Encounter for antineoplastic immunotherapy: Secondary | ICD-10-CM

## 2021-02-15 DIAGNOSIS — E538 Deficiency of other specified B group vitamins: Secondary | ICD-10-CM | POA: Diagnosis not present

## 2021-02-15 DIAGNOSIS — Z801 Family history of malignant neoplasm of trachea, bronchus and lung: Secondary | ICD-10-CM | POA: Diagnosis not present

## 2021-02-15 DIAGNOSIS — I509 Heart failure, unspecified: Secondary | ICD-10-CM

## 2021-02-15 DIAGNOSIS — J449 Chronic obstructive pulmonary disease, unspecified: Secondary | ICD-10-CM

## 2021-02-15 DIAGNOSIS — I110 Hypertensive heart disease with heart failure: Secondary | ICD-10-CM | POA: Diagnosis not present

## 2021-02-15 DIAGNOSIS — Z9981 Dependence on supplemental oxygen: Secondary | ICD-10-CM | POA: Diagnosis not present

## 2021-02-15 DIAGNOSIS — C3492 Malignant neoplasm of unspecified part of left bronchus or lung: Secondary | ICD-10-CM

## 2021-02-15 DIAGNOSIS — R911 Solitary pulmonary nodule: Secondary | ICD-10-CM | POA: Diagnosis not present

## 2021-02-15 DIAGNOSIS — Z79899 Other long term (current) drug therapy: Secondary | ICD-10-CM

## 2021-02-15 DIAGNOSIS — Z5111 Encounter for antineoplastic chemotherapy: Secondary | ICD-10-CM

## 2021-02-15 DIAGNOSIS — C7951 Secondary malignant neoplasm of bone: Secondary | ICD-10-CM

## 2021-02-15 DIAGNOSIS — C3490 Malignant neoplasm of unspecified part of unspecified bronchus or lung: Secondary | ICD-10-CM | POA: Insufficient documentation

## 2021-02-15 DIAGNOSIS — R918 Other nonspecific abnormal finding of lung field: Secondary | ICD-10-CM | POA: Diagnosis not present

## 2021-02-15 DIAGNOSIS — Z87891 Personal history of nicotine dependence: Secondary | ICD-10-CM

## 2021-02-15 DIAGNOSIS — J439 Emphysema, unspecified: Secondary | ICD-10-CM | POA: Diagnosis not present

## 2021-02-15 DIAGNOSIS — Z95828 Presence of other vascular implants and grafts: Secondary | ICD-10-CM

## 2021-02-15 DIAGNOSIS — I700 Atherosclerosis of aorta: Secondary | ICD-10-CM | POA: Diagnosis not present

## 2021-02-15 DIAGNOSIS — K760 Fatty (change of) liver, not elsewhere classified: Secondary | ICD-10-CM | POA: Diagnosis not present

## 2021-02-15 LAB — CMP (CANCER CENTER ONLY)
ALT: 49 U/L — ABNORMAL HIGH (ref 0–44)
AST: 33 U/L (ref 15–41)
Albumin: 3.3 g/dL — ABNORMAL LOW (ref 3.5–5.0)
Alkaline Phosphatase: 103 U/L (ref 38–126)
Anion gap: 9 (ref 5–15)
BUN: 13 mg/dL (ref 8–23)
CO2: 29 mmol/L (ref 22–32)
Calcium: 9.2 mg/dL (ref 8.9–10.3)
Chloride: 104 mmol/L (ref 98–111)
Creatinine: 0.83 mg/dL (ref 0.44–1.00)
GFR, Estimated: >60 mL/min (ref >60)
Glucose, Bld: 129 mg/dL — ABNORMAL HIGH (ref 70–99)
Potassium: 3.8 mmol/L (ref 3.5–5.1)
Sodium: 142 mmol/L (ref 135–145)
Total Bilirubin: 0.3 mg/dL (ref 0.3–1.2)
Total Protein: 7.2 g/dL (ref 6.5–8.1)

## 2021-02-15 LAB — CBC WITH DIFFERENTIAL (CANCER CENTER ONLY)
Abs Immature Granulocytes: 0.02 10*3/uL (ref 0.00–0.07)
Basophils Absolute: 0 10*3/uL (ref 0.0–0.1)
Basophils Relative: 1 %
Eosinophils Absolute: 0.2 10*3/uL (ref 0.0–0.5)
Eosinophils Relative: 2 %
HCT: 37.5 % (ref 36.0–46.0)
Hemoglobin: 11.7 g/dL — ABNORMAL LOW (ref 12.0–15.0)
Immature Granulocytes: 0 %
Lymphocytes Relative: 21 %
Lymphs Abs: 1.4 10*3/uL (ref 0.7–4.0)
MCH: 33.2 pg (ref 26.0–34.0)
MCHC: 31.2 g/dL (ref 30.0–36.0)
MCV: 106.5 fL — ABNORMAL HIGH (ref 80.0–100.0)
Monocytes Absolute: 0.8 10*3/uL (ref 0.1–1.0)
Monocytes Relative: 12 %
Neutro Abs: 4.2 10*3/uL (ref 1.7–7.7)
Neutrophils Relative %: 64 %
Platelet Count: 366 10*3/uL (ref 150–400)
RBC: 3.52 MIL/uL — ABNORMAL LOW (ref 3.87–5.11)
RDW: 16.4 % — ABNORMAL HIGH (ref 11.5–15.5)
WBC Count: 6.6 10*3/uL (ref 4.0–10.5)
nRBC: 0 % (ref 0.0–0.2)

## 2021-02-15 MED ORDER — HEPARIN SOD (PORK) LOCK FLUSH 100 UNIT/ML IV SOLN
INTRAVENOUS | Status: AC
  Administered 2021-02-15: 500 [IU] via INTRAVENOUS
  Filled 2021-02-15: qty 5

## 2021-02-15 MED ORDER — HEPARIN SOD (PORK) LOCK FLUSH 100 UNIT/ML IV SOLN: 500.0000 [IU] | Freq: Once | INTRAVENOUS | Status: AC

## 2021-02-15 MED ORDER — PROCHLORPERAZINE MALEATE 10 MG PO TABS
10.0000 mg | ORAL_TABLET | Freq: Once | ORAL | Status: AC
  Administered 2021-02-15: 10 mg via ORAL
  Filled 2021-02-15: qty 1

## 2021-02-15 MED ORDER — IOHEXOL 350 MG/ML SOLN
75.0000 mL | Freq: Once | INTRAVENOUS | Status: AC | PRN
  Administered 2021-02-15: 75 mL via INTRAVENOUS

## 2021-02-15 MED ORDER — FOLIC ACID 1 MG PO TABS: 1.0000 mg | ORAL_TABLET | ORAL | 2 refills | Status: AC

## 2021-02-15 MED ORDER — SODIUM CHLORIDE 0.9 % IV SOLN: Freq: Once | INTRAVENOUS | Status: AC

## 2021-02-15 MED ORDER — SODIUM CHLORIDE 0.9 % IV SOLN
500.0000 mg/m2 | Freq: Once | INTRAVENOUS | Status: AC
  Administered 2021-02-15: 1100 mg via INTRAVENOUS
  Filled 2021-02-15: qty 4

## 2021-02-15 MED ORDER — SODIUM CHLORIDE 0.9% FLUSH
10.0000 mL | Freq: Once | INTRAVENOUS | Status: AC
  Administered 2021-02-15: 10 mL

## 2021-02-15 MED ORDER — SODIUM CHLORIDE 0.9% FLUSH
10.0000 mL | INTRAVENOUS | Status: DC | PRN
  Administered 2021-02-15: 10 mL

## 2021-02-15 MED ORDER — CYANOCOBALAMIN 1000 MCG/ML IJ SOLN
1000.0000 ug | Freq: Once | INTRAMUSCULAR | Status: AC
  Administered 2021-02-15: 1000 ug via INTRAMUSCULAR
  Filled 2021-02-15: qty 1

## 2021-02-15 MED ORDER — SODIUM CHLORIDE 0.9 % IV SOLN
200.0000 mg | Freq: Once | INTRAVENOUS | Status: AC
  Administered 2021-02-15: 200 mg via INTRAVENOUS
  Filled 2021-02-15: qty 8

## 2021-02-15 MED ORDER — HEPARIN SOD (PORK) LOCK FLUSH 100 UNIT/ML IV SOLN
500.0000 [IU] | Freq: Once | INTRAVENOUS | Status: AC | PRN
  Administered 2021-02-15: 500 [IU]

## 2021-02-15 NOTE — Patient Instructions (Signed)
[facility] ONCOL[facility]   Discharge Instructions: Thank you for choosing [facility] to provide your oncology and hematology care.   If you have a lab appointment with the [facility], please go directly to the [facility] and check in at the registration area.   Wear comfortable clothing and clothing appropriate for easy access to any Portacath or PICC line.   We strive to give you quality time with your provider. You may need to reschedule your appointment if you arrive late (15 or more minutes).  Arriving late affects you and other patients whose appointments are after yours.  Also, if you miss three or more appointments without notifying the office, you may be dismissed from the clinic at the provider's discretion.      For prescription refill requests, have your pharmacy contact our office and allow 72 hours for refills to be completed.    Today you received the following chemotherapy and/or immunotherapy agents: pembrolizumab and pemetrexed.      To help prevent nausea and vomiting after your treatment, we encourage you to take your nausea medication as directed.  BELOW ARE SYMPTOMS THAT SHOULD BE REPORTED IMMEDIATELY: *FEVER GREATER THAN 100.4 F (38 C) OR HIGHER *CHILLS OR SWEATING *NAUSEA AND VOMITING THAT IS NOT CONTROLLED WITH YOUR NAUSEA MEDICATION *UNUSUAL SHORTNESS OF BREATH *UNUSUAL BRUISING OR BLEEDING *URINARY PROBLEMS (pain or burning when urinating, or frequent urination) *BOWEL PROBLEMS (unusual diarrhea, constipation, pain near the anus) TENDERNESS IN MOUTH AND THROAT WITH OR WITHOUT PRESENCE OF ULCERS (sore throat, sores in mouth, or a toothache) UNUSUAL RASH, SWELLING OR PAIN  UNUSUAL VAGINAL DISCHARGE OR ITCHING   Items with * indicate a potential emergency and should be followed up as soon as possible or go to the Emergency Department if any problems should occur.  Please show the CHEMOTHERAPY ALERT CARD or IMMUNOTHERAPY  ALERT CARD at check-in to the Emergency Department and triage nurse.  Should you have questions after your visit or need to cancel or reschedule your appointment, please contact [facility]  Dept: [phone]  and follow the prompts.  Office hours are 8:00 a.m. to 4:30 p.m. Monday - Friday. Please note that voicemails left after 4:00 p.m. may not be returned until the following business day.  We are closed weekends and major holidays. You have access to a nurse at all times for urgent questions. Please call the main number to the clinic Dept: [phone] and follow the prompts.   For any non-urgent questions, you may also contact your provider using MyChart. We now offer e-Visits for anyone 10 and older to request care online for non-urgent symptoms. For details visit [email or web address].   Also download the MyChart app! Go to the app store, search "MyChart", open the app, select [facility], and log in with your MyChart username and password.  Due to Covid, a mask is required upon entering the hospital/clinic. If you do not have a mask, one will be given to you upon arrival. For doctor visits, patients may have 1 support person aged 74 or older with them. For treatment visits, patients cannot have anyone with them due to current Covid guidelines and our immunocompromised population.

## 2021-02-15 NOTE — Progress Notes (Signed)
[facility] Telephone:(336) [phone]   Fax:(336) [phone]  OFFICE PROGRESS NOTE  Koirala, Dibas, MD [address] [place]  [address]  DIAGNOSIS: Stage IV (T2a, N2, M1b) non-small cell lung cancer, adenocarcinoma diagnosed in May 2022 and presented with left lower lobe lung mass in addition to left hilar and mediastinal lymphadenopathy as well as bilateral pulmonary nodules and metastatic disease to the ribs bilaterally. 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  PRIOR THERAPY: None  CURRENT THERAPY: Systemic chemotherapy with carboplatin for AUC of 5, Alimta 500 Mg/M2 and Keytruda 200 Mg IV every 3 weeks.  Started at [facility] on 10/13/2020. Status post 6 cycles.  Starting from cycle #5, the patient will be on maintenance treatment with Alimta and Keytruda every 3 weeks.  INTERVAL HISTORY: Tiffany Owen 60 y.o. female returns to the clinic today for follow-up visit accompanied by her daughter.  The patient is feeling fine today with no concerning complaints.  She denied having any current chest pain, shortness of breath except with exertion with no cough or hemoptysis.  She denied having any fever or chills.  She has no nausea, vomiting, diarrhea or constipation.  She has no fever or chills.  She has no headache or visual changes.  She has been tolerating her maintenance treatment with Alimta and Keytruda fairly well.  The patient had repeat CT scan of the chest, abdomen pelvis performed recently and she is here for evaluation and discussion of her scan results before starting cycle #7.  MEDICAL HISTORY: Past Medical History:  Diagnosis Date   Cancer [facility])    CHF (congestive heart failure) (HCC)    COPD (chronic obstructive pulmonary disease) ([place])    Family history of lung cancer 10/29/2020   Hypertension    Pneumonia 04/17/2018    ALLERGIES:  has No Known  Allergies.  MEDICATIONS:  Current Outpatient Medications  Medication Sig Dispense Refill   acetaminophen (TYLENOL) 500 MG tablet Take 2 tablets (1,000 mg total) by mouth every 8 (eight) hours. 30 tablet 0   budesonide (PULMICORT) 0.5 MG/2ML nebulizer solution Take 0.5 mg by nebulization 2 (two) times daily as needed (Asthma).     carvedilol (COREG) 12.5 MG tablet Take 12.5 mg by mouth 2 (two) times daily with a meal.     Cholecalciferol (VITAMIN D3 PO) Take 1 capsule by mouth daily.     Cyanocobalamin (VITAMIN B-12 PO) Take 1 tablet by mouth daily.     folic acid (FOLVITE) 1 MG tablet Take 1 tablet (1 mg total) by mouth See admin instructions. Take 1 mg daily by mouth (Patient taking differently: Take 1 mg by mouth daily. Take 1 mg daily by mouth) 30 tablet 2   hydrochlorothiazide (HYDRODIURIL) 25 MG tablet Take 25 mg by mouth daily.     ipratropium-albuterol (DUONEB) 0.5-2.5 (3) MG/3ML SOLN Take 3 mLs by nebulization every 6 (six) hours as needed. (Patient taking differently: Take 3 mLs by nebulization every 6 (six) hours as needed (Asthma).) 360 mL 1   lidocaine (LIDODERM) 5 % Place 1 patch onto the skin daily. Remove & Discard patch within 12 hours or as directed by MD 30 patch 0   lidocaine-prilocaine (EMLA) cream Apply 1 application topically as needed (for port access). 30 g 0   LORazepam (ATIVAN) 0.5 MG tablet One tab po 30 min before MRI. Repeat once if needed. 2 tablet 0  metFORMIN (GLUCOPHAGE-XR) 500 MG 24 hr tablet Take 500 mg by mouth daily with breakfast.     Multiple Vitamins-Minerals (ONE-A-DAY WOMENS PO) Take 1 tablet by mouth daily with breakfast.     oxyCODONE (OXY IR/ROXICODONE) 5 MG immediate release tablet Take 1 tablet (5 mg total) by mouth every 8 (eight) hours as needed for moderate pain. 20 tablet 0   OXYGEN Inhale 3 L/min into the lungs continuous.     prochlorperazine (COMPAZINE) 10 MG tablet Take 10 mg by mouth every 6 (six) hours as needed for nausea or vomiting.      Tiotropium Bromide-Olodaterol 2.5-2.5 MCG/ACT AERS Inhale 2 puffs into the lungs daily. 4 g 11   No current facility-administered medications for this visit.    SURGICAL HISTORY:  Past Surgical History:  Procedure Laterality Date   CESAREAN SECTION  1979; 1981; 1992   IR IMAGING GUIDED PORT INSERTION  11/12/2020   TUBAL LIGATION  1992    REVIEW OF SYSTEMS:  Constitutional: positive for fatigue Eyes: negative Ears, nose, mouth, throat, and face: negative Respiratory: positive for dyspnea on exertion Cardiovascular: negative Gastrointestinal: negative Genitourinary:negative Integument/breast: negative Hematologic/lymphatic: negative Musculoskeletal:negative Neurological: negative Behavioral/Psych: negative Endocrine: negative Allergic/Immunologic: negative   PHYSICAL EXAMINATION: General appearance: alert, cooperative, fatigued, and no distress Head: Normocephalic, without obvious abnormality, atraumatic Neck: no adenopathy, no JVD, supple, symmetrical, trachea midline, and thyroid not enlarged, symmetric, no tenderness/mass/nodules Lymph nodes: Cervical, supraclavicular, and axillary nodes normal. Resp: clear to auscultation bilaterally Back: symmetric, no curvature. ROM normal. No CVA tenderness. Cardio: regular rate and rhythm, S1, S2 normal, no murmur, click, rub or gallop GI: soft, non-tender; bowel sounds normal; no masses,  no organomegaly Extremities: extremities normal, atraumatic, no cyanosis or edema Neurologic: Alert and oriented X 3, normal strength and tone. Normal symmetric reflexes. Normal coordination and gait  ECOG PERFORMANCE STATUS: 1 - Symptomatic but completely ambulatory  Blood pressure (!) 152/79, pulse 94, temperature (!) 97.1 F (36.2 C), temperature source Tympanic, resp. rate 20, height _0  (1.448 m), weight 273 lb 11.2 oz (124.1 kg), SpO2 91 %.  LABORATORY DATA: Lab Results  Component Value Date   WBC 6.6 02/15/2021   HGB 11.7 (L)  02/15/2021   HCT 37.5 02/15/2021   MCV 106.5 (H) 02/15/2021   PLT 366 02/15/2021      Chemistry      Component Value Date/Time   NA 143 01/25/2021 0924   K 4.2 01/25/2021 0924   CL 106 01/25/2021 0924   CO2 26 01/25/2021 0924   BUN 12 01/25/2021 0924   CREATININE 0.84 01/25/2021 0924      Component Value Date/Time   CALCIUM 9.5 01/25/2021 0924   ALKPHOS 96 01/25/2021 0924   AST 36 01/25/2021 0924   ALT 59 (H) 01/25/2021 0924   BILITOT 0.3 01/25/2021 0924       RADIOGRAPHIC STUDIES: No results found.  ASSESSMENT AND PLAN: This is a very pleasant 60 years old African-American female diagnosed with Stage IV (T2a, N2, M1b) non-small cell lung cancer, adenocarcinoma diagnosed in May 2022 and presented with left lower lobe lung mass in addition to left hilar and mediastinal lymphadenopathy as well as bilateral pulmonary nodules and metastatic disease to the ribs bilaterally. 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 The patient is currently undergoing systemic chemotherapy with carboplatin for AUC of 5, Alimta 500 Mg/M2 and Keytruda 200 Mg IV every 3 weeks status post 6 cycle.  First cycle of her treatment was started at [facility] in [place].  Starting from cycle #5 the patient will be on maintenance treatment with Alimta and Keytruda every 3 weeks. The patient continues to tolerate her maintenance treatment with Alimta and Keytruda fairly well. She had repeat CT scan of the chest, abdomen pelvis performed earlier today.  Unfortunately the final report is still pending.  I personally and independently reviewed the images in comparison to the previous scan and I did not see any concerning findings for progression but I will wait for the final report for confirmation. I recommended for her to continue her current maintenance treatment with Alimta and Keytruda every 3 weeks. The patient will come back  for follow-up visit in 3 weeks for evaluation before starting cycle #8. I will give her a refill of folic acid today. She was advised to call immediately if she has any other concerning symptoms in the interval. The patient voices understanding of current disease status and treatment options and is in agreement with the current care plan.  All questions were answered. The patient knows to call the clinic with any problems, questions or concerns. We can certainly see the patient much sooner if necessary.   Disclaimer: This note was dictated with voice recognition software. Similar sounding words can inadvertently be transcribed and may not be corrected upon review.

## 2021-02-17 ENCOUNTER — Telehealth: Payer: Self-pay | Admitting: Internal Medicine

## 2021-02-17 DIAGNOSIS — R0902 Hypoxemia: Secondary | ICD-10-CM | POA: Diagnosis not present

## 2021-02-17 NOTE — Telephone Encounter (Signed)
Left message with follow-up appointment per 10/31 los.

## 2021-02-21 DIAGNOSIS — J449 Chronic obstructive pulmonary disease, unspecified: Secondary | ICD-10-CM | POA: Diagnosis not present

## 2021-02-21 DIAGNOSIS — J9610 Chronic respiratory failure, unspecified whether with hypoxia or hypercapnia: Secondary | ICD-10-CM | POA: Diagnosis not present

## 2021-02-24 DIAGNOSIS — I5033 Acute on chronic diastolic (congestive) heart failure: Secondary | ICD-10-CM | POA: Diagnosis not present

## 2021-02-26 DIAGNOSIS — G4733 Obstructive sleep apnea (adult) (pediatric): Secondary | ICD-10-CM | POA: Diagnosis not present

## 2021-02-26 DIAGNOSIS — C3490 Malignant neoplasm of unspecified part of unspecified bronchus or lung: Secondary | ICD-10-CM | POA: Diagnosis not present

## 2021-02-26 DIAGNOSIS — J449 Chronic obstructive pulmonary disease, unspecified: Secondary | ICD-10-CM | POA: Diagnosis not present

## 2021-02-26 DIAGNOSIS — J9611 Chronic respiratory failure with hypoxia: Secondary | ICD-10-CM | POA: Diagnosis not present

## 2021-03-04 ENCOUNTER — Other Ambulatory Visit: Payer: Self-pay | Admitting: *Deleted

## 2021-03-04 DIAGNOSIS — C3492 Malignant neoplasm of unspecified part of left bronchus or lung: Secondary | ICD-10-CM

## 2021-03-08 ENCOUNTER — Encounter: Payer: Self-pay | Admitting: *Deleted

## 2021-03-08 ENCOUNTER — Other Ambulatory Visit: Payer: Self-pay

## 2021-03-08 ENCOUNTER — Inpatient Hospital Stay: Payer: BC Managed Care – PPO | Attending: Internal Medicine

## 2021-03-08 ENCOUNTER — Inpatient Hospital Stay: Payer: BC Managed Care – PPO

## 2021-03-08 ENCOUNTER — Inpatient Hospital Stay (HOSPITAL_BASED_OUTPATIENT_CLINIC_OR_DEPARTMENT_OTHER): Payer: BC Managed Care – PPO | Admitting: Internal Medicine

## 2021-03-08 ENCOUNTER — Encounter: Payer: Self-pay | Admitting: Internal Medicine

## 2021-03-08 VITALS — BP 134/78 | HR 84 | Temp 97.0°F | Resp 18 | Wt 271.2 lb

## 2021-03-08 DIAGNOSIS — C3492 Malignant neoplasm of unspecified part of left bronchus or lung: Secondary | ICD-10-CM

## 2021-03-08 DIAGNOSIS — I509 Heart failure, unspecified: Secondary | ICD-10-CM | POA: Insufficient documentation

## 2021-03-08 DIAGNOSIS — C7951 Secondary malignant neoplasm of bone: Secondary | ICD-10-CM | POA: Insufficient documentation

## 2021-03-08 DIAGNOSIS — Z5112 Encounter for antineoplastic immunotherapy: Secondary | ICD-10-CM | POA: Insufficient documentation

## 2021-03-08 DIAGNOSIS — Z9981 Dependence on supplemental oxygen: Secondary | ICD-10-CM | POA: Diagnosis not present

## 2021-03-08 DIAGNOSIS — Z79899 Other long term (current) drug therapy: Secondary | ICD-10-CM | POA: Diagnosis not present

## 2021-03-08 DIAGNOSIS — I110 Hypertensive heart disease with heart failure: Secondary | ICD-10-CM | POA: Diagnosis not present

## 2021-03-08 DIAGNOSIS — Z87891 Personal history of nicotine dependence: Secondary | ICD-10-CM | POA: Diagnosis not present

## 2021-03-08 DIAGNOSIS — Z5111 Encounter for antineoplastic chemotherapy: Secondary | ICD-10-CM | POA: Insufficient documentation

## 2021-03-08 DIAGNOSIS — E538 Deficiency of other specified B group vitamins: Secondary | ICD-10-CM | POA: Diagnosis not present

## 2021-03-08 DIAGNOSIS — J449 Chronic obstructive pulmonary disease, unspecified: Secondary | ICD-10-CM | POA: Insufficient documentation

## 2021-03-08 DIAGNOSIS — Z801 Family history of malignant neoplasm of trachea, bronchus and lung: Secondary | ICD-10-CM | POA: Insufficient documentation

## 2021-03-08 DIAGNOSIS — Z95828 Presence of other vascular implants and grafts: Secondary | ICD-10-CM

## 2021-03-08 LAB — CBC WITH DIFFERENTIAL (CANCER CENTER ONLY)
Abs Immature Granulocytes: 0.02 10*3/uL (ref 0.00–0.07)
Basophils Absolute: 0 10*3/uL (ref 0.0–0.1)
Basophils Relative: 0 %
Eosinophils Absolute: 0.1 10*3/uL (ref 0.0–0.5)
Eosinophils Relative: 2 %
HCT: 37.9 % (ref 36.0–46.0)
Hemoglobin: 12 g/dL (ref 12.0–15.0)
Immature Granulocytes: 0 %
Lymphocytes Relative: 28 %
Lymphs Abs: 1.7 10*3/uL (ref 0.7–4.0)
MCH: 33.9 pg (ref 26.0–34.0)
MCHC: 31.7 g/dL (ref 30.0–36.0)
MCV: 107.1 fL — ABNORMAL HIGH (ref 80.0–100.0)
Monocytes Absolute: 1.1 10*3/uL — ABNORMAL HIGH (ref 0.1–1.0)
Monocytes Relative: 18 %
Neutro Abs: 3.2 10*3/uL (ref 1.7–7.7)
Neutrophils Relative %: 52 %
Platelet Count: 356 10*3/uL (ref 150–400)
RBC: 3.54 MIL/uL — ABNORMAL LOW (ref 3.87–5.11)
RDW: 14.7 % (ref 11.5–15.5)
WBC Count: 6.1 10*3/uL (ref 4.0–10.5)
nRBC: 0 % (ref 0.0–0.2)

## 2021-03-08 LAB — CMP (CANCER CENTER ONLY)
ALT: 73 U/L — ABNORMAL HIGH (ref 0–44)
AST: 67 U/L — ABNORMAL HIGH (ref 15–41)
Albumin: 3.3 g/dL — ABNORMAL LOW (ref 3.5–5.0)
Alkaline Phosphatase: 105 U/L (ref 38–126)
Anion gap: 10 (ref 5–15)
BUN: 14 mg/dL (ref 8–23)
CO2: 28 mmol/L (ref 22–32)
Calcium: 9.5 mg/dL (ref 8.9–10.3)
Chloride: 101 mmol/L (ref 98–111)
Creatinine: 0.92 mg/dL (ref 0.44–1.00)
GFR, Estimated: >60 mL/min (ref >60)
Glucose, Bld: 107 mg/dL — ABNORMAL HIGH (ref 70–99)
Potassium: 3.9 mmol/L (ref 3.5–5.1)
Sodium: 139 mmol/L (ref 135–145)
Total Bilirubin: 0.3 mg/dL (ref 0.3–1.2)
Total Protein: 7.5 g/dL (ref 6.5–8.1)

## 2021-03-08 LAB — TSH: TSH: 1.759 u[IU]/mL (ref 0.308–3.960)

## 2021-03-08 MED ORDER — PROCHLORPERAZINE MALEATE 10 MG PO TABS
10.0000 mg | ORAL_TABLET | Freq: Once | ORAL | Status: AC
  Administered 2021-03-08: 10 mg via ORAL

## 2021-03-08 MED ORDER — SODIUM CHLORIDE 0.9 % IV SOLN: Freq: Once | INTRAVENOUS | Status: AC

## 2021-03-08 MED ORDER — SODIUM CHLORIDE 0.9 % IV SOLN
500.0000 mg/m2 | Freq: Once | INTRAVENOUS | Status: AC
  Administered 2021-03-08: 1100 mg via INTRAVENOUS
  Filled 2021-03-08: qty 40

## 2021-03-08 MED ORDER — SODIUM CHLORIDE 0.9 % IV SOLN
200.0000 mg | Freq: Once | INTRAVENOUS | Status: AC
  Administered 2021-03-08: 200 mg via INTRAVENOUS
  Filled 2021-03-08: qty 8

## 2021-03-08 MED ORDER — SODIUM CHLORIDE 0.9% FLUSH
10.0000 mL | Freq: Once | INTRAVENOUS | Status: AC
  Administered 2021-03-08: 10 mL

## 2021-03-08 MED ORDER — LIDOCAINE-PRILOCAINE 2.5-2.5 % EX CREA: 1.0000 "application " | TOPICAL_CREAM | CUTANEOUS | 0 refills | Status: AC | PRN

## 2021-03-08 NOTE — Patient Instructions (Signed)
[facility]COLOG[facility]   Discharge Instructions: Thank you for choosing [facility] to provide your oncology and hematology care.   If you have a lab appointment with the [facility], please go directly to the [facility] and check in at the registration area.   Wear comfortable clothing and clothing appropriate for easy access to any Portacath or PICC line.   We strive to give you quality time with your provider. You may need to reschedule your appointment if you arrive late (15 or more minutes).  Arriving late affects you and other patients whose appointments are after yours.  Also, if you miss three or more appointments without notifying the office, you may be dismissed from the clinic at the provider's discretion.      For prescription refill requests, have your pharmacy contact our office and allow 72 hours for refills to be completed.    Today you received the following chemotherapy and/or immunotherapy agents: pembrolizumab and pemetrexed.      To help prevent nausea and vomiting after your treatment, we encourage you to take your nausea medication as directed.  BELOW ARE SYMPTOMS THAT SHOULD BE REPORTED IMMEDIATELY: *FEVER GREATER THAN 100.4 F (38 C) OR HIGHER *CHILLS OR SWEATING *NAUSEA AND VOMITING THAT IS NOT CONTROLLED WITH YOUR NAUSEA MEDICATION *UNUSUAL SHORTNESS OF BREATH *UNUSUAL BRUISING OR BLEEDING *URINARY PROBLEMS (pain or burning when urinating, or frequent urination) *BOWEL PROBLEMS (unusual diarrhea, constipation, pain near the anus) TENDERNESS IN MOUTH AND THROAT WITH OR WITHOUT PRESENCE OF ULCERS (sore throat, sores in mouth, or a toothache) UNUSUAL RASH, SWELLING OR PAIN  UNUSUAL VAGINAL DISCHARGE OR ITCHING   Items with * indicate a potential emergency and should be followed up as soon as possible or go to the Emergency Department if any problems should occur.  Please show the CHEMOTHERAPY ALERT CARD or IMMUNOTHERAPY  ALERT CARD at check-in to the Emergency Department and triage nurse.  Should you have questions after your visit or need to cancel or reschedule your appointment, please contact [facility]  Dept: [phone]  and follow the prompts.  Office hours are 8:00 a.m. to 4:30 p.m. Monday - Friday. Please note that voicemails left after 4:00 p.m. may not be returned until the following business day.  We are closed weekends and major holidays. You have access to a nurse at all times for urgent questions. Please call the main number to the clinic Dept: [phone] and follow the prompts.   For any non-urgent questions, you may also contact your provider using MyChart. We now offer e-Visits for anyone 61 and older to request care online for non-urgent symptoms. For details visit [email or web address].   Also download the MyChart app! Go to the app store, search "MyChart", open the app, select , and log in with your MyChart username and password.  Due to Covid, a mask is required upon entering the hospital/clinic. If you do not have a mask, one will be given to you upon arrival. For doctor visits, patients may have 1 support person aged 61 or older with them. For treatment visits, patients cannot have anyone with them due to current Covid guidelines and our immunocompromised population.

## 2021-03-08 NOTE — Progress Notes (Signed)
[place] [phone]   [phone]  OFFICE PROGRESS NOTE  Koirala, Dibas, MD [address] [place] [address]  DIAGNOSIS: Stage IV (T2a, N2, M1b) non-small cell lung cancer, adenocarcinoma diagnosed in May 2022 and presented with left lower lobe lung mass in addition to left hilar and mediastinal lymphadenopathy as well as bilateral pulmonary nodules and metastatic disease to the ribs bilaterally. 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  PRIOR THERAPY: None  CURRENT THERAPY: Systemic chemotherapy with carboplatin for AUC of 5, Alimta 500 Mg/M2 and Keytruda 200 Mg IV every 3 weeks.  Started at [facility] on 10/13/2020. Status post 6 cycles.  Starting from cycle #5, the patient will be on maintenance treatment with Alimta and Keytruda every 3 weeks.  INTERVAL HISTORY: Tiffany Owen 60 y.o. female returns to the clinic today for follow-up visit accompanied by her daughter.  The patient is feeling fine today with no concerning complaints except for the baseline shortness of breath increased with exertion.  She denied having any fatigue or weakness.  She denied having any nausea, vomiting, diarrhea or constipation.  She has no chest pain, cough or hemoptysis.  She has no headache or visual changes.  She is here today for evaluation before starting cycle #7 of her treatment.   MEDICAL HISTORY: Past Medical History:  Diagnosis Date   Cancer [facility])    CHF (congestive heart failure) (HCC)    COPD (chronic obstructive pulmonary disease) ([place])    Family history of lung cancer 10/29/2020   Hypertension    Pneumonia 04/17/2018    ALLERGIES:  has No Known Allergies.  MEDICATIONS:  Current Outpatient Medications  Medication Sig Dispense Refill   acetaminophen (TYLENOL) 500 MG tablet Take 2 tablets (1,000 mg total) by mouth every 8 (eight) hours. 30 tablet 0    budesonide (PULMICORT) 0.5 MG/2ML nebulizer solution Take 0.5 mg by nebulization 2 (two) times daily as needed (Asthma).     carvedilol (COREG) 12.5 MG tablet Take 12.5 mg by mouth 2 (two) times daily with a meal.     Cholecalciferol (VITAMIN D3 PO) Take 1 capsule by mouth daily.     Cyanocobalamin (VITAMIN B-12 PO) Take 1 tablet by mouth daily.     folic acid (FOLVITE) 1 MG tablet Take 1 tablet (1 mg total) by mouth See admin instructions. Take 1 mg daily by mouth 30 tablet 2   hydrochlorothiazide (HYDRODIURIL) 25 MG tablet Take 25 mg by mouth daily.     ipratropium-albuterol (DUONEB) 0.5-2.5 (3) MG/3ML SOLN Take 3 mLs by nebulization every 6 (six) hours as needed. (Patient taking differently: Take 3 mLs by nebulization every 6 (six) hours as needed (Asthma).) 360 mL 1   lidocaine (LIDODERM) 5 % Place 1 patch onto the skin daily. Remove & Discard patch within 12 hours or as directed by MD 30 patch 0   lidocaine-prilocaine (EMLA) cream Apply 1 application topically as needed (for port access). 30 g 0   LORazepam (ATIVAN) 0.5 MG tablet One tab po 30 min before MRI. Repeat once if needed. 2 tablet 0   metFORMIN (GLUCOPHAGE-XR) 500 MG 24 hr tablet Take 500 mg by mouth daily with breakfast.     Multiple Vitamins-Minerals (ONE-A-DAY WOMENS PO) Take 1 tablet by mouth daily with breakfast.     oxyCODONE (OXY IR/ROXICODONE) 5 MG immediate release tablet Take 1 tablet (5 mg  total) by mouth every 8 (eight) hours as needed for moderate pain. 20 tablet 0   OXYGEN Inhale 3 L/min into the lungs continuous.     prochlorperazine (COMPAZINE) 10 MG tablet Take 10 mg by mouth every 6 (six) hours as needed for nausea or vomiting.     Tiotropium Bromide-Olodaterol 2.5-2.5 MCG/ACT AERS Inhale 2 puffs into the lungs daily. 4 g 11   No current facility-administered medications for this visit.    SURGICAL HISTORY:  Past Surgical History:  Procedure Laterality Date   CESAREAN SECTION  1979; 1981; 1992   IR IMAGING  GUIDED PORT INSERTION  11/12/2020   TUBAL LIGATION  1992    REVIEW OF SYSTEMS:  A comprehensive review of systems was negative except for: Respiratory: positive for dyspnea on exertion   PHYSICAL EXAMINATION: General appearance: alert, cooperative, fatigued, and no distress Head: Normocephalic, without obvious abnormality, atraumatic Neck: no adenopathy, no JVD, supple, symmetrical, trachea midline, and thyroid not enlarged, symmetric, no tenderness/mass/nodules Lymph nodes: Cervical, supraclavicular, and axillary nodes normal. Resp: clear to auscultation bilaterally Back: symmetric, no curvature. ROM normal. No CVA tenderness. Cardio: regular rate and rhythm, S1, S2 normal, no murmur, click, rub or gallop GI: soft, non-tender; bowel sounds normal; no masses,  no organomegaly Extremities: extremities normal, atraumatic, no cyanosis or edema  ECOG PERFORMANCE STATUS: 1 - Symptomatic but completely ambulatory  Blood pressure 134/78, pulse 84, temperature (!) 97 F (36.1 C), resp. rate 18, weight 271 lb 3.2 oz (123 kg), SpO2 93 %.  LABORATORY DATA: Lab Results  Component Value Date   WBC 6.1 03/08/2021   HGB 12.0 03/08/2021   HCT 37.9 03/08/2021   MCV 107.1 (H) 03/08/2021   PLT 356 03/08/2021      Chemistry      Component Value Date/Time   NA 142 02/15/2021 1019   K 3.8 02/15/2021 1019   CL 104 02/15/2021 1019   CO2 29 02/15/2021 1019   BUN 13 02/15/2021 1019   CREATININE 0.83 02/15/2021 1019      Component Value Date/Time   CALCIUM 9.2 02/15/2021 1019   ALKPHOS 103 02/15/2021 1019   AST 33 02/15/2021 1019   ALT 49 (H) 02/15/2021 1019   BILITOT 0.3 02/15/2021 1019       RADIOGRAPHIC STUDIES: CT Chest W Contrast  Result Date: 02/15/2021 CLINICAL DATA:  Non-small cell lung cancer restaging, ongoing chemotherapy EXAM: CT CHEST, ABDOMEN, AND PELVIS WITH CONTRAST TECHNIQUE: Multidetector CT imaging of the chest, abdomen and pelvis was performed following the standard  protocol during bolus administration of intravenous contrast. CONTRAST:  51m OMNIPAQUE IOHEXOL 350 MG/ML SOLN, additional oral enteric contrast COMPARISON:  12/11/2020 FINDINGS: CT CHEST FINDINGS Cardiovascular: Right chest port catheter. Aortic atherosclerosis. Normal heart size. No pericardial effusion. Mediastinum/Nodes: No enlarged mediastinal, hilar, or axillary lymph nodes. Thyroid gland, trachea, and esophagus demonstrate no significant findings. Lungs/Pleura: Moderate centrilobular emphysema. A previously seen subpleural mass of the medial left lower lobe remains completely resolved, with minimal bandlike residua in this vicinity (series 6, image 80). Continued interval decrease in size of multiple small pulmonary nodules, for example a nodule of the left apex measuring 1.3 x 0.4 cm, previously 1.2 x 0.9 cm (series 6, image 29) and of the peripheral left upper lobe measuring 0.4 cm, previously 0.5 cm (series 6, image 48). No new nodules. Bandlike scarring of the bilateral lung bases. No pleural effusion or pneumothorax. Musculoskeletal: No chest wall mass. Redemonstrated sclerotic lesion of the lateral left fifth rib, right second  rib, and right fourth rib at the site of previously seen lytic metastases (series 6, image 63, 33, 68). CT ABDOMEN PELVIS FINDINGS Hepatobiliary: No solid liver abnormality is seen. Hepatic steatosis. No gallstones, gallbladder wall thickening, or biliary dilatation. Pancreas: Unremarkable. No pancreatic ductal dilatation or surrounding inflammatory changes. Spleen: Low-attenuation lesion of the spleen is poorly appreciated and not significantly changed, measuring 1.3 x 1.2 cm (series 2, image 50). Adrenals/Urinary Tract: Adrenal glands are unremarkable. Kidneys are normal, without renal calculi, solid lesion, or hydronephrosis. Bladder is unremarkable. Stomach/Bowel: Stomach is within normal limits. Appendix appears normal. No evidence of bowel wall thickening, distention, or  inflammatory changes. Sigmoid diverticula. Vascular/Lymphatic: Aortic atherosclerosis. Unchanged prominent bilateral iliac and pelvic sidewall lymph nodes measuring up to 2.1 x 1.4 cm (series 2, image 87)., as previously noted not FDG avid (series 2, image 87, 91). Reproductive: Uterine fibroids. Other: Fat containing umbilical hernia.  No abdominopelvic ascites. Musculoskeletal: No acute osseous findings. Unchanged sclerotic lesion of the right pubic symphysis (series 2, image 102). IMPRESSION: 1. A previously seen subpleural mass of the medial left lower lobe remains completely resolved, with minimal bandlike residua in this vicinity. 2. Continued interval decrease in size of multiple small pulmonary nodules. 3. Findings are consistent with continued treatment response of primary lung malignancy and pulmonary metastases. 4. Low-attenuation lesion of the spleen is poorly appreciated and not significantly changed, and remains modestly suspicious for a splenic metastasis. Apparent change in size over prior examinations may reflect sensitivity of appearance to exact phase of contrast. Attention on follow-up. 5. Unchanged sclerotic lesions of the ribs and right pubic symphysis, consistent with treated osseous metastases. 6. Unchanged prominent bilateral iliac and pelvic sidewall lymph nodes measuring up to 2.1 x 1.4 cm. As previously noted, these were not FDG avid on prior PET-CT and likely reactive. 7. Emphysema. Aortic Atherosclerosis (ICD10-I70.0) and Emphysema (ICD10-J43.9). Electronically Signed   By: Delanna Ahmadi M.D.   On: 02/15/2021 15:51   CT Abdomen Pelvis W Contrast  Result Date: 02/15/2021 CLINICAL DATA:  Non-small cell lung cancer restaging, ongoing chemotherapy EXAM: CT CHEST, ABDOMEN, AND PELVIS WITH CONTRAST TECHNIQUE: Multidetector CT imaging of the chest, abdomen and pelvis was performed following the standard protocol during bolus administration of intravenous contrast. CONTRAST:  86m  OMNIPAQUE IOHEXOL 350 MG/ML SOLN, additional oral enteric contrast COMPARISON:  12/11/2020 FINDINGS: CT CHEST FINDINGS Cardiovascular: Right chest port catheter. Aortic atherosclerosis. Normal heart size. No pericardial effusion. Mediastinum/Nodes: No enlarged mediastinal, hilar, or axillary lymph nodes. Thyroid gland, trachea, and esophagus demonstrate no significant findings. Lungs/Pleura: Moderate centrilobular emphysema. A previously seen subpleural mass of the medial left lower lobe remains completely resolved, with minimal bandlike residua in this vicinity (series 6, image 80). Continued interval decrease in size of multiple small pulmonary nodules, for example a nodule of the left apex measuring 1.3 x 0.4 cm, previously 1.2 x 0.9 cm (series 6, image 29) and of the peripheral left upper lobe measuring 0.4 cm, previously 0.5 cm (series 6, image 48). No new nodules. Bandlike scarring of the bilateral lung bases. No pleural effusion or pneumothorax. Musculoskeletal: No chest wall mass. Redemonstrated sclerotic lesion of the lateral left fifth rib, right second rib, and right fourth rib at the site of previously seen lytic metastases (series 6, image 63, 33, 68). CT ABDOMEN PELVIS FINDINGS Hepatobiliary: No solid liver abnormality is seen. Hepatic steatosis. No gallstones, gallbladder wall thickening, or biliary dilatation. Pancreas: Unremarkable. No pancreatic ductal dilatation or surrounding inflammatory changes. Spleen: Low-attenuation lesion of  the spleen is poorly appreciated and not significantly changed, measuring 1.3 x 1.2 cm (series 2, image 50). Adrenals/Urinary Tract: Adrenal glands are unremarkable. Kidneys are normal, without renal calculi, solid lesion, or hydronephrosis. Bladder is unremarkable. Stomach/Bowel: Stomach is within normal limits. Appendix appears normal. No evidence of bowel wall thickening, distention, or inflammatory changes. Sigmoid diverticula. Vascular/Lymphatic: Aortic  atherosclerosis. Unchanged prominent bilateral iliac and pelvic sidewall lymph nodes measuring up to 2.1 x 1.4 cm (series 2, image 87)., as previously noted not FDG avid (series 2, image 87, 91). Reproductive: Uterine fibroids. Other: Fat containing umbilical hernia.  No abdominopelvic ascites. Musculoskeletal: No acute osseous findings. Unchanged sclerotic lesion of the right pubic symphysis (series 2, image 102). IMPRESSION: 1. A previously seen subpleural mass of the medial left lower lobe remains completely resolved, with minimal bandlike residua in this vicinity. 2. Continued interval decrease in size of multiple small pulmonary nodules. 3. Findings are consistent with continued treatment response of primary lung malignancy and pulmonary metastases. 4. Low-attenuation lesion of the spleen is poorly appreciated and not significantly changed, and remains modestly suspicious for a splenic metastasis. Apparent change in size over prior examinations may reflect sensitivity of appearance to exact phase of contrast. Attention on follow-up. 5. Unchanged sclerotic lesions of the ribs and right pubic symphysis, consistent with treated osseous metastases. 6. Unchanged prominent bilateral iliac and pelvic sidewall lymph nodes measuring up to 2.1 x 1.4 cm. As previously noted, these were not FDG avid on prior PET-CT and likely reactive. 7. Emphysema. Aortic Atherosclerosis (ICD10-I70.0) and Emphysema (ICD10-J43.9). Electronically Signed   By: Delanna Ahmadi M.D.   On: 02/15/2021 15:51    ASSESSMENT AND PLAN: This is a very pleasant 60 years old African-American female diagnosed with Stage IV (T2a, N2, M1b) non-small cell lung cancer, adenocarcinoma diagnosed in May 2022 and presented with left lower lobe lung mass in addition to left hilar and mediastinal lymphadenopathy as well as bilateral pulmonary nodules and metastatic disease to the ribs bilaterally. Her molecular studies by [facility] showed positive EGFR mutation in  exon 20 as well as PD-L1 expression of 100%. The patient is currently undergoing systemic chemotherapy with carboplatin for AUC of 5, Alimta 500 Mg/M2 and Keytruda 200 Mg IV every 3 weeks status post 7 cycle.  First cycle of her treatment was started at [facility] in [place].  Starting from cycle #5 the patient will be on maintenance treatment with Alimta and Keytruda every 3 weeks. The patient continues to tolerate her treatment fairly well with no concerning adverse effects. I recommended for her to proceed with cycle #8 today as planned. I will see her back for follow-up visit in 3 weeks for evaluation before the next cycle of her treatment. She was advised to call immediately if she has any concerning symptoms in the interval. The patient voices understanding of current disease status and treatment options and is in agreement with the current care plan.  All questions were answered. The patient knows to call the clinic with any problems, questions or concerns. We can certainly see the patient much sooner if necessary.   Disclaimer: This note was dictated with voice recognition software. Similar sounding words can inadvertently be transcribed and may not be corrected upon review.

## 2021-03-08 NOTE — Progress Notes (Signed)
Oncology Nurse Navigator Documentation  Oncology Nurse Navigator Flowsheets 03/08/2021 12/04/2020  Abnormal Finding Date - 07/24/2020  Confirmed Diagnosis Date - 09/02/2020  Diagnosis Status - Confirmed Diagnosis Complete  Planned Course of Treatment - Chemotherapy;Targeted Therapy  Phase of Treatment - Targeted Therapy  Chemotherapy Actual Start Date: - 11/23/2020  Targeted Therapy Actual Start Date: - 11/23/2020  Navigator Follow Up Date: 03/29/2021 12/14/2020  Navigator Follow Up Reason: Follow-up Appointment Follow-up Appointment  Navigator Location [facility]  Navigator Encounter Type Follow-up Appt Other:  Treatment Initiated Date - 11/23/2020  Patient Visit Type Follow-up;MedOnc Other  Treatment Phase Treatment/I spoke to patient and her daughter today during her visit to see Dr. Julien Nordmann. She is doing well today without complaints.  Per Dr.  Julien Nordmann, her scan looked good with shrink in tumor.  I offered support and encouragement.   Treatment  Barriers/Navigation Needs - Coordination of Care  Interventions Psycho-Social Support Coordination of Care  Acuity Level 1-No Barriers Level 2-Minimal Needs (1-2 Barriers Identified)  Coordination of Care - Other  Time Spent with Patient 30 30

## 2021-03-17 ENCOUNTER — Telehealth: Payer: Self-pay | Admitting: Internal Medicine

## 2021-03-17 NOTE — Telephone Encounter (Signed)
Sch per 10/31 los, pt daughter aware

## 2021-03-19 DIAGNOSIS — R0902 Hypoxemia: Secondary | ICD-10-CM | POA: Diagnosis not present

## 2021-03-23 DIAGNOSIS — J449 Chronic obstructive pulmonary disease, unspecified: Secondary | ICD-10-CM | POA: Diagnosis not present

## 2021-03-23 DIAGNOSIS — J9610 Chronic respiratory failure, unspecified whether with hypoxia or hypercapnia: Secondary | ICD-10-CM | POA: Diagnosis not present

## 2021-03-25 ENCOUNTER — Other Ambulatory Visit: Payer: Self-pay | Admitting: Internal Medicine

## 2021-03-25 DIAGNOSIS — C3492 Malignant neoplasm of unspecified part of left bronchus or lung: Secondary | ICD-10-CM

## 2021-03-26 DIAGNOSIS — I5033 Acute on chronic diastolic (congestive) heart failure: Secondary | ICD-10-CM | POA: Diagnosis not present

## 2021-03-28 DIAGNOSIS — J449 Chronic obstructive pulmonary disease, unspecified: Secondary | ICD-10-CM | POA: Diagnosis not present

## 2021-03-28 DIAGNOSIS — C3490 Malignant neoplasm of unspecified part of unspecified bronchus or lung: Secondary | ICD-10-CM | POA: Diagnosis not present

## 2021-03-28 DIAGNOSIS — J9611 Chronic respiratory failure with hypoxia: Secondary | ICD-10-CM | POA: Diagnosis not present

## 2021-03-28 DIAGNOSIS — G4733 Obstructive sleep apnea (adult) (pediatric): Secondary | ICD-10-CM | POA: Diagnosis not present

## 2021-03-29 ENCOUNTER — Inpatient Hospital Stay: Payer: BC Managed Care – PPO

## 2021-03-29 ENCOUNTER — Encounter: Payer: Self-pay | Admitting: *Deleted

## 2021-03-29 ENCOUNTER — Encounter: Payer: Self-pay | Admitting: Internal Medicine

## 2021-03-29 ENCOUNTER — Other Ambulatory Visit: Payer: Self-pay

## 2021-03-29 ENCOUNTER — Inpatient Hospital Stay (HOSPITAL_BASED_OUTPATIENT_CLINIC_OR_DEPARTMENT_OTHER): Payer: BC Managed Care – PPO | Admitting: Internal Medicine

## 2021-03-29 ENCOUNTER — Inpatient Hospital Stay: Payer: BC Managed Care – PPO | Attending: Internal Medicine

## 2021-03-29 VITALS — BP 140/78 | HR 86 | Temp 96.3°F | Resp 20 | Ht <= 58 in | Wt 270.9 lb

## 2021-03-29 DIAGNOSIS — I110 Hypertensive heart disease with heart failure: Secondary | ICD-10-CM | POA: Insufficient documentation

## 2021-03-29 DIAGNOSIS — Z801 Family history of malignant neoplasm of trachea, bronchus and lung: Secondary | ICD-10-CM | POA: Insufficient documentation

## 2021-03-29 DIAGNOSIS — Z5111 Encounter for antineoplastic chemotherapy: Secondary | ICD-10-CM | POA: Insufficient documentation

## 2021-03-29 DIAGNOSIS — Z95828 Presence of other vascular implants and grafts: Secondary | ICD-10-CM

## 2021-03-29 DIAGNOSIS — C3492 Malignant neoplasm of unspecified part of left bronchus or lung: Secondary | ICD-10-CM | POA: Insufficient documentation

## 2021-03-29 DIAGNOSIS — E538 Deficiency of other specified B group vitamins: Secondary | ICD-10-CM | POA: Diagnosis not present

## 2021-03-29 DIAGNOSIS — Z9981 Dependence on supplemental oxygen: Secondary | ICD-10-CM | POA: Diagnosis not present

## 2021-03-29 DIAGNOSIS — C7951 Secondary malignant neoplasm of bone: Secondary | ICD-10-CM | POA: Diagnosis not present

## 2021-03-29 DIAGNOSIS — Z87891 Personal history of nicotine dependence: Secondary | ICD-10-CM | POA: Diagnosis not present

## 2021-03-29 DIAGNOSIS — J449 Chronic obstructive pulmonary disease, unspecified: Secondary | ICD-10-CM | POA: Insufficient documentation

## 2021-03-29 DIAGNOSIS — Z79899 Other long term (current) drug therapy: Secondary | ICD-10-CM | POA: Insufficient documentation

## 2021-03-29 DIAGNOSIS — I509 Heart failure, unspecified: Secondary | ICD-10-CM | POA: Diagnosis not present

## 2021-03-29 DIAGNOSIS — Z5112 Encounter for antineoplastic immunotherapy: Secondary | ICD-10-CM

## 2021-03-29 LAB — CMP (CANCER CENTER ONLY)
ALT: 33 U/L (ref 0–44)
AST: 31 U/L (ref 15–41)
Albumin: 3.1 g/dL — ABNORMAL LOW (ref 3.5–5.0)
Alkaline Phosphatase: 94 U/L (ref 38–126)
Anion gap: 11 (ref 5–15)
BUN: 14 mg/dL (ref 8–23)
CO2: 28 mmol/L (ref 22–32)
Calcium: 9.1 mg/dL (ref 8.9–10.3)
Chloride: 104 mmol/L (ref 98–111)
Creatinine: 0.9 mg/dL (ref 0.44–1.00)
GFR, Estimated: >60 mL/min (ref >60)
Glucose, Bld: 112 mg/dL — ABNORMAL HIGH (ref 70–99)
Potassium: 4 mmol/L (ref 3.5–5.1)
Sodium: 143 mmol/L (ref 135–145)
Total Bilirubin: 0.3 mg/dL (ref 0.3–1.2)
Total Protein: 7.3 g/dL (ref 6.5–8.1)

## 2021-03-29 LAB — CBC WITH DIFFERENTIAL (CANCER CENTER ONLY)
Abs Immature Granulocytes: 0.05 10*3/uL (ref 0.00–0.07)
Basophils Absolute: 0 10*3/uL (ref 0.0–0.1)
Basophils Relative: 0 %
Eosinophils Absolute: 0.1 10*3/uL (ref 0.0–0.5)
Eosinophils Relative: 2 %
HCT: 38.1 % (ref 36.0–46.0)
Hemoglobin: 11.9 g/dL — ABNORMAL LOW (ref 12.0–15.0)
Immature Granulocytes: 1 %
Lymphocytes Relative: 29 %
Lymphs Abs: 1.8 10*3/uL (ref 0.7–4.0)
MCH: 33.2 pg (ref 26.0–34.0)
MCHC: 31.2 g/dL (ref 30.0–36.0)
MCV: 106.4 fL — ABNORMAL HIGH (ref 80.0–100.0)
Monocytes Absolute: 1.1 10*3/uL — ABNORMAL HIGH (ref 0.1–1.0)
Monocytes Relative: 18 %
Neutro Abs: 3.1 10*3/uL (ref 1.7–7.7)
Neutrophils Relative %: 50 %
Platelet Count: 386 10*3/uL (ref 150–400)
RBC: 3.58 MIL/uL — ABNORMAL LOW (ref 3.87–5.11)
RDW: 14.4 % (ref 11.5–15.5)
WBC Count: 6.2 10*3/uL (ref 4.0–10.5)
nRBC: 0 % (ref 0.0–0.2)

## 2021-03-29 LAB — TSH: TSH: 2.869 u[IU]/mL (ref 0.308–3.960)

## 2021-03-29 MED ORDER — SODIUM CHLORIDE 0.9 % IV SOLN: Freq: Once | INTRAVENOUS | Status: AC

## 2021-03-29 MED ORDER — HEPARIN SOD (PORK) LOCK FLUSH 100 UNIT/ML IV SOLN
500.0000 [IU] | Freq: Once | INTRAVENOUS | Status: AC | PRN
  Administered 2021-03-29: 500 [IU]

## 2021-03-29 MED ORDER — SODIUM CHLORIDE 0.9% FLUSH
10.0000 mL | INTRAVENOUS | Status: DC | PRN
  Administered 2021-03-29: 10 mL

## 2021-03-29 MED ORDER — PROCHLORPERAZINE MALEATE 10 MG PO TABS
10.0000 mg | ORAL_TABLET | Freq: Once | ORAL | Status: AC
  Administered 2021-03-29: 10 mg via ORAL
  Filled 2021-03-29: qty 1

## 2021-03-29 MED ORDER — SODIUM CHLORIDE 0.9 % IV SOLN
200.0000 mg | Freq: Once | INTRAVENOUS | Status: AC
  Administered 2021-03-29: 200 mg via INTRAVENOUS
  Filled 2021-03-29: qty 8

## 2021-03-29 MED ORDER — SODIUM CHLORIDE 0.9% FLUSH
10.0000 mL | Freq: Once | INTRAVENOUS | Status: AC
  Administered 2021-03-29: 10 mL

## 2021-03-29 MED ORDER — SODIUM CHLORIDE 0.9 % IV SOLN
500.0000 mg/m2 | Freq: Once | INTRAVENOUS | Status: AC
  Administered 2021-03-29: 1100 mg via INTRAVENOUS
  Filled 2021-03-29: qty 4

## 2021-03-29 NOTE — Progress Notes (Signed)
[place] [phone]   [phone]) [phone]  OFFICE PROGRESS NOTE  Koirala, Dibas, MD [address] [place] [address]  DIAGNOSIS: Stage IV (T2a, N2, M1b) non-small cell lung cancer, adenocarcinoma diagnosed in May 2022 and presented with left lower lobe lung mass in addition to left hilar and mediastinal lymphadenopathy as well as bilateral pulmonary nodules and metastatic disease to the ribs bilaterally. 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  PRIOR THERAPY: None  CURRENT THERAPY: Systemic chemotherapy with carboplatin for AUC of 5, Alimta 500 Mg/M2 and Keytruda 200 Mg IV every 3 weeks.  Started at [facility] on 10/13/2020. Status post 6 cycles.  Starting from cycle #5, the patient will be on maintenance treatment with Alimta and Keytruda every 3 weeks.  INTERVAL HISTORY: Dorsey Charette 60 y.o. female returns to the clinic today for follow-up visit accompanied by her daughter.  The patient is feeling fine today with no concerning complaints except for mild fatigue.  She denied having any current chest pain but has shortness of breath with exertion with no cough or hemoptysis.  She denied having any fever or chills.  She has no nausea, vomiting, diarrhea or constipation.  She has no headache or visual changes.  She is here today for evaluation before starting cycle #9 of her treatment.   MEDICAL HISTORY: Past Medical History:  Diagnosis Date   Cancer [facility])    CHF (congestive heart failure) (HCC)    COPD (chronic obstructive pulmonary disease) ([place])    Family history of lung cancer 10/29/2020   Hypertension    Pneumonia 04/17/2018    ALLERGIES:  has No Known Allergies.  MEDICATIONS:  Current Outpatient Medications  Medication Sig Dispense Refill   acetaminophen (TYLENOL) 500 MG tablet Take 2 tablets (1,000 mg total) by mouth every 8 (eight) hours. 30 tablet 0    budesonide (PULMICORT) 0.5 MG/2ML nebulizer solution Take 0.5 mg by nebulization 2 (two) times daily as needed (Asthma).     carvedilol (COREG) 12.5 MG tablet Take 12.5 mg by mouth 2 (two) times daily with a meal.     Cholecalciferol (VITAMIN D3 PO) Take 1 capsule by mouth daily.     Cyanocobalamin (VITAMIN B-12 PO) Take 1 tablet by mouth daily.     folic acid (FOLVITE) 1 MG tablet Take 1 tablet (1 mg total) by mouth See admin instructions. Take 1 mg daily by mouth 30 tablet 2   hydrochlorothiazide (HYDRODIURIL) 25 MG tablet Take 25 mg by mouth daily.     ipratropium-albuterol (DUONEB) 0.5-2.5 (3) MG/3ML SOLN Take 3 mLs by nebulization every 6 (six) hours as needed. (Patient taking differently: Take 3 mLs by nebulization every 6 (six) hours as needed (Asthma).) 360 mL 1   lidocaine (LIDODERM) 5 % Place 1 patch onto the skin daily. Remove & Discard patch within 12 hours or as directed by MD 30 patch 0   lidocaine-prilocaine (EMLA) cream Apply 1 application topically as needed (for port access). 30 g 0   LORazepam (ATIVAN) 0.5 MG tablet One tab po 30 min before MRI. Repeat once if needed. 2 tablet 0   metFORMIN (GLUCOPHAGE-XR) 500 MG 24 hr tablet Take 500 mg by mouth daily with breakfast.     Multiple Vitamins-Minerals (ONE-A-DAY WOMENS PO) Take 1 tablet by mouth daily with breakfast.     oxyCODONE (OXY IR/ROXICODONE) 5 MG immediate release tablet Take  1 tablet (5 mg total) by mouth every 8 (eight) hours as needed for moderate pain. 20 tablet 0   OXYGEN Inhale 3 L/min into the lungs continuous.     prochlorperazine (COMPAZINE) 10 MG tablet Take 10 mg by mouth every 6 (six) hours as needed for nausea or vomiting.     Tiotropium Bromide-Olodaterol 2.5-2.5 MCG/ACT AERS Inhale 2 puffs into the lungs daily. 4 g 11   No current facility-administered medications for this visit.    SURGICAL HISTORY:  Past Surgical History:  Procedure Laterality Date   CESAREAN SECTION  1979; 1981; 1992   IR IMAGING  GUIDED PORT INSERTION  11/12/2020   TUBAL LIGATION  1992    REVIEW OF SYSTEMS:  A comprehensive review of systems was negative except for: Constitutional: positive for fatigue Respiratory: positive for dyspnea on exertion   PHYSICAL EXAMINATION: General appearance: alert, cooperative, fatigued, and no distress Head: Normocephalic, without obvious abnormality, atraumatic Neck: no adenopathy, no JVD, supple, symmetrical, trachea midline, and thyroid not enlarged, symmetric, no tenderness/mass/nodules Lymph nodes: Cervical, supraclavicular, and axillary nodes normal. Resp: clear to auscultation bilaterally Back: symmetric, no curvature. ROM normal. No CVA tenderness. Cardio: regular rate and rhythm, S1, S2 normal, no murmur, click, rub or gallop GI: soft, non-tender; bowel sounds normal; no masses,  no organomegaly Extremities: extremities normal, atraumatic, no cyanosis or edema  ECOG PERFORMANCE STATUS: 1 - Symptomatic but completely ambulatory  Blood pressure 140/78, pulse 86, temperature (!) 96.3 F (35.7 C), temperature source Tympanic, resp. rate 20, height _0  (1.448 m), weight 270 lb 14.4 oz (122.9 kg), SpO2 97 %.  LABORATORY DATA: Lab Results  Component Value Date   WBC 6.1 03/08/2021   HGB 12.0 03/08/2021   HCT 37.9 03/08/2021   MCV 107.1 (H) 03/08/2021   PLT 356 03/08/2021      Chemistry      Component Value Date/Time   NA 139 03/08/2021 0910   K 3.9 03/08/2021 0910   CL 101 03/08/2021 0910   CO2 28 03/08/2021 0910   BUN 14 03/08/2021 0910   CREATININE 0.92 03/08/2021 0910      Component Value Date/Time   CALCIUM 9.5 03/08/2021 0910   ALKPHOS 105 03/08/2021 0910   AST 67 (H) 03/08/2021 0910   ALT 73 (H) 03/08/2021 0910   BILITOT 0.3 03/08/2021 0910       RADIOGRAPHIC STUDIES: No results found.  ASSESSMENT AND PLAN: This is a very pleasant 60 years old African-American female diagnosed with Stage IV (T2a, N2, M1b) non-small cell lung cancer,  adenocarcinoma diagnosed in May 2022 and presented with left lower lobe lung mass in addition to left hilar and mediastinal lymphadenopathy as well as bilateral pulmonary nodules and metastatic disease to the ribs bilaterally. 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 The patient is currently undergoing systemic chemotherapy with carboplatin for AUC of 5, Alimta 500 Mg/M2 and Keytruda 200 Mg IV every 3 weeks status post 8 cycle.  First cycle of her treatment was started at [facility] in [place].  Starting from cycle #5 the patient will be on maintenance treatment with Alimta and Keytruda every 3 weeks. The patient continues to tolerate her treatment fairly well with no concerning adverse effects. I recommended for her to proceed with cycle #9 today as planned. I will see her back for follow-up visit in 3 weeks for evaluation before starting cycle #10.  I will consider  repeating the imaging studies after the next cycle of her treatment. She was advised to call immediately if she has any other concerning symptoms in the interval. The patient voices understanding of current disease status and treatment options and is in agreement with the current care plan.  All questions were answered. The patient knows to call the clinic with any problems, questions or concerns. We can certainly see the patient much sooner if necessary.   Disclaimer: This note was dictated with voice recognition software. Similar sounding words can inadvertently be transcribed and may not be corrected upon review.

## 2021-03-29 NOTE — Progress Notes (Signed)
Oncology Nurse Navigator Documentation  Oncology Nurse Navigator Flowsheets 03/29/2021 03/08/2021 12/04/2020  Abnormal Finding Date - - 07/24/2020  Confirmed Diagnosis Date - - 09/02/2020  Diagnosis Status - - Confirmed Diagnosis Complete  Planned Course of Treatment - - Chemotherapy;Targeted Therapy  Phase of Treatment - - Targeted Therapy  Chemotherapy Actual Start Date: - - 11/23/2020  Targeted Therapy Actual Start Date: - - 11/23/2020  Navigator Follow Up Date: - 03/29/2021 12/14/2020  Navigator Follow Up Reason: - Follow-up Appointment Follow-up Appointment  Navigation Complete Date: 03/29/2021 - -  Post Navigation: Continue to Follow Patient? No - -  Reason Not Navigating Patient: Patient On Maintenance Chemotherapy - -  Nav[facility]  Navigator Encounter Type Follow-up Appt Follow-up Appt Other:  Treatment Initiated Date - - 11/23/2020  Patient Visit Type MedOnc;Follow-up Follow-up;MedOnc Other  Treatment Phase Treatment Treatment Treatment  Barriers/Navigation Needs - - Coordination of Care  Interventions Psycho-Social Support Psycho-Social Support Coordination of Care  Acuity Level 1-No Barriers Level 1-No Barriers Level 2-Minimal Needs (1-2 Barriers Identified)  Coordination of Care - - Other  Time Spent with Patient 15 30 30

## 2021-03-29 NOTE — Patient Instructions (Signed)
[facility]  Discharge Instructions: Thank you for choosing [facility] to provide your oncology and hematology care.   If you have a lab appointment with the Cancer Center, please go directly to the Cancer Center and check in at the registration area.   Wear comfortable clothing and clothing appropriate for easy access to any Portacath or PICC line.   We strive to give you quality time with your provider. You may need to reschedule your appointment if you arrive late (15 or more minutes).  Arriving late affects you and other patients whose appointments are after yours.  Also, if you miss three or more appointments without notifying the office, you may be dismissed from the clinic at the provider's discretion.      For prescription refill requests, have your pharmacy contact our office and allow 72 hours for refills to be completed.    Today you received the following chemotherapy and/or immunotherapy agents: Keytruda/Alimta.      To help prevent nausea and vomiting after your treatment, we encourage you to take your nausea medication as directed.  BELOW ARE SYMPTOMS THAT SHOULD BE REPORTED IMMEDIATELY: *FEVER GREATER THAN 100.4 F (38 C) OR HIGHER *CHILLS OR SWEATING *NAUSEA AND VOMITING THAT IS NOT CONTROLLED WITH YOUR NAUSEA MEDICATION *UNUSUAL SHORTNESS OF BREATH *UNUSUAL BRUISING OR BLEEDING *URINARY PROBLEMS (pain or burning when urinating, or frequent urination) *BOWEL PROBLEMS (unusual diarrhea, constipation, pain near the anus) TENDERNESS IN MOUTH AND THROAT WITH OR WITHOUT PRESENCE OF ULCERS (sore throat, sores in mouth, or a toothache) UNUSUAL RASH, SWELLING OR PAIN  UNUSUAL VAGINAL DISCHARGE OR ITCHING   Items with * indicate a potential emergency and should be followed up as soon as possible or go to the Emergency Department if any problems should occur.  Please show the CHEMOTHERAPY ALERT CARD or IMMUNOTHERAPY ALERT CARD at  check-in to the Emergency Department and triage nurse.  Should you have questions after your visit or need to cancel or reschedule your appointment, please contact [facility]  Dept: [phone]  and follow the prompts.  Office hours are 8:00 a.m. to 4:30 p.m. Monday - Friday. Please note that voicemails left after 4:00 p.m. may not be returned until the following business day.  We are closed weekends and major holidays. You have access to a nurse at all times for urgent questions. Please call the main number to the clinic Dept: [phone] and follow the prompts.   For any non-urgent questions, you may also contact your provider using MyChart. We now offer e-Visits for anyone 18 and older to request care online for non-urgent symptoms. For details visit [email or web address].   Also download the MyChart app! Go to the app store, search "MyChart", open the app, select [facility], and log in with your MyChart username and password.  Due to Covid, a mask is required upon entering the hospital/clinic. If you do not have a mask, one will be given to you upon arrival. For doctor visits, patients may have 1 support person aged 18 or older with them. For treatment visits, patients cannot have anyone with them due to current Covid guidelines and our immunocompromised population.   

## 2021-04-04 ENCOUNTER — Other Ambulatory Visit: Payer: Self-pay

## 2021-04-05 ENCOUNTER — Telehealth: Payer: Self-pay | Admitting: *Deleted

## 2021-04-05 NOTE — Telephone Encounter (Signed)
-----   Message from Rigoberto Noel, MD sent at 04/03/2021  2:58 AM EST ----- Regarding: FW: trilogy Pl schdeule OV with me/ BW in 1 month ----- Message ----- From: Martyn Ehrich, NP Sent: 12/28/2020   8:07 AM EST To: Rigoberto Noel, MD Subject: trilogy                                        OSA patient of yours. HST in February 2020 showed moderate OSA, AHI 16/hr with severe oxygen desaturations. Dr Elsworth Soho recommended CPAP titration, this was not completed. She was put on Triology 2 month ago after a recent hospitalization for hypercarbic respiratory failure  She never had started CPAP, are you ok with her staying on Triology + 3L oxygen

## 2021-04-05 NOTE — Telephone Encounter (Signed)
Needs 1 month rov with RA or APP- tried calling and there was no answer. Sending to front desk pool, thanks!

## 2021-04-06 NOTE — Telephone Encounter (Signed)
Tried to call pt but after the line rang twice, went to a busy tone. Will keep encounter open so we can try to see if we are able to reach pt.

## 2021-04-07 NOTE — Telephone Encounter (Signed)
Attempted to call patient but after the line rang twice, went to a busy tone. Mailed letter for patient to call the office.

## 2021-04-20 ENCOUNTER — Other Ambulatory Visit: Payer: BC Managed Care – PPO

## 2021-04-20 ENCOUNTER — Encounter: Payer: Self-pay | Admitting: Internal Medicine

## 2021-04-20 ENCOUNTER — Inpatient Hospital Stay: Payer: BC Managed Care – PPO | Attending: Internal Medicine

## 2021-04-20 ENCOUNTER — Other Ambulatory Visit: Payer: Self-pay

## 2021-04-20 ENCOUNTER — Inpatient Hospital Stay: Payer: BC Managed Care – PPO

## 2021-04-20 ENCOUNTER — Inpatient Hospital Stay: Payer: BC Managed Care – PPO | Admitting: Internal Medicine

## 2021-04-20 ENCOUNTER — Ambulatory Visit: Payer: BC Managed Care – PPO

## 2021-04-20 ENCOUNTER — Ambulatory Visit: Payer: BC Managed Care – PPO | Admitting: Physician Assistant

## 2021-04-20 VITALS — BP 158/91 | HR 88 | Temp 96.7°F | Resp 20 | Ht <= 58 in | Wt 267.5 lb

## 2021-04-20 DIAGNOSIS — Z79899 Other long term (current) drug therapy: Secondary | ICD-10-CM | POA: Diagnosis not present

## 2021-04-20 DIAGNOSIS — Z5111 Encounter for antineoplastic chemotherapy: Secondary | ICD-10-CM | POA: Diagnosis not present

## 2021-04-20 DIAGNOSIS — Z95828 Presence of other vascular implants and grafts: Secondary | ICD-10-CM

## 2021-04-20 DIAGNOSIS — Z801 Family history of malignant neoplasm of trachea, bronchus and lung: Secondary | ICD-10-CM | POA: Diagnosis not present

## 2021-04-20 DIAGNOSIS — C7951 Secondary malignant neoplasm of bone: Secondary | ICD-10-CM | POA: Diagnosis not present

## 2021-04-20 DIAGNOSIS — I509 Heart failure, unspecified: Secondary | ICD-10-CM | POA: Insufficient documentation

## 2021-04-20 DIAGNOSIS — I110 Hypertensive heart disease with heart failure: Secondary | ICD-10-CM | POA: Insufficient documentation

## 2021-04-20 DIAGNOSIS — C3490 Malignant neoplasm of unspecified part of unspecified bronchus or lung: Secondary | ICD-10-CM

## 2021-04-20 DIAGNOSIS — Z5112 Encounter for antineoplastic immunotherapy: Secondary | ICD-10-CM | POA: Diagnosis not present

## 2021-04-20 DIAGNOSIS — C3492 Malignant neoplasm of unspecified part of left bronchus or lung: Secondary | ICD-10-CM | POA: Insufficient documentation

## 2021-04-20 DIAGNOSIS — Z9981 Dependence on supplemental oxygen: Secondary | ICD-10-CM | POA: Insufficient documentation

## 2021-04-20 DIAGNOSIS — Z87891 Personal history of nicotine dependence: Secondary | ICD-10-CM | POA: Diagnosis not present

## 2021-04-20 DIAGNOSIS — E538 Deficiency of other specified B group vitamins: Secondary | ICD-10-CM | POA: Diagnosis not present

## 2021-04-20 DIAGNOSIS — J449 Chronic obstructive pulmonary disease, unspecified: Secondary | ICD-10-CM | POA: Diagnosis not present

## 2021-04-20 LAB — CBC WITH DIFFERENTIAL (CANCER CENTER ONLY)
Abs Immature Granulocytes: 0.02 10*3/uL (ref 0.00–0.07)
Basophils Absolute: 0 10*3/uL (ref 0.0–0.1)
Basophils Relative: 0 %
Eosinophils Absolute: 0.1 10*3/uL (ref 0.0–0.5)
Eosinophils Relative: 1 %
HCT: 38.6 % (ref 36.0–46.0)
Hemoglobin: 12.1 g/dL (ref 12.0–15.0)
Immature Granulocytes: 0 %
Lymphocytes Relative: 27 %
Lymphs Abs: 1.6 10*3/uL (ref 0.7–4.0)
MCH: 32.8 pg (ref 26.0–34.0)
MCHC: 31.3 g/dL (ref 30.0–36.0)
MCV: 104.6 fL — ABNORMAL HIGH (ref 80.0–100.0)
Monocytes Absolute: 1 10*3/uL (ref 0.1–1.0)
Monocytes Relative: 17 %
Neutro Abs: 3.1 10*3/uL (ref 1.7–7.7)
Neutrophils Relative %: 55 %
Platelet Count: 399 10*3/uL (ref 150–400)
RBC: 3.69 MIL/uL — ABNORMAL LOW (ref 3.87–5.11)
RDW: 14.4 % (ref 11.5–15.5)
WBC Count: 5.7 10*3/uL (ref 4.0–10.5)
nRBC: 0 % (ref 0.0–0.2)

## 2021-04-20 LAB — CMP (CANCER CENTER ONLY)
ALT: 27 U/L (ref 0–44)
AST: 32 U/L (ref 15–41)
Albumin: 3.6 g/dL (ref 3.5–5.0)
Alkaline Phosphatase: 99 U/L (ref 38–126)
Anion gap: 7 (ref 5–15)
BUN: 14 mg/dL (ref 8–23)
CO2: 31 mmol/L (ref 22–32)
Calcium: 9.4 mg/dL (ref 8.9–10.3)
Chloride: 104 mmol/L (ref 98–111)
Creatinine: 0.84 mg/dL (ref 0.44–1.00)
GFR, Estimated: >60 mL/min (ref >60)
Glucose, Bld: 113 mg/dL — ABNORMAL HIGH (ref 70–99)
Potassium: 4 mmol/L (ref 3.5–5.1)
Sodium: 142 mmol/L (ref 135–145)
Total Bilirubin: 0.4 mg/dL (ref 0.3–1.2)
Total Protein: 7.5 g/dL (ref 6.5–8.1)

## 2021-04-20 LAB — TSH: TSH: 2.258 u[IU]/mL (ref 0.308–3.960)

## 2021-04-20 MED ORDER — PROCHLORPERAZINE MALEATE 10 MG PO TABS
10.0000 mg | ORAL_TABLET | Freq: Once | ORAL | Status: AC
  Administered 2021-04-20: 10 mg via ORAL
  Filled 2021-04-20: qty 1

## 2021-04-20 MED ORDER — SODIUM CHLORIDE 0.9 % IV SOLN
200.0000 mg | Freq: Once | INTRAVENOUS | Status: AC
  Administered 2021-04-20: 200 mg via INTRAVENOUS
  Filled 2021-04-20: qty 8

## 2021-04-20 MED ORDER — SODIUM CHLORIDE 0.9 % IV SOLN: Freq: Once | INTRAVENOUS | Status: AC

## 2021-04-20 MED ORDER — CYANOCOBALAMIN 1000 MCG/ML IJ SOLN
1000.0000 ug | Freq: Once | INTRAMUSCULAR | Status: AC
  Administered 2021-04-20: 1000 ug via INTRAMUSCULAR
  Filled 2021-04-20: qty 1

## 2021-04-20 MED ORDER — SODIUM CHLORIDE 0.9% FLUSH
10.0000 mL | Freq: Once | INTRAVENOUS | Status: AC
  Administered 2021-04-20: 10 mL

## 2021-04-20 MED ORDER — SODIUM CHLORIDE 0.9% FLUSH
10.0000 mL | INTRAVENOUS | Status: DC | PRN
  Administered 2021-04-20: 10 mL

## 2021-04-20 MED ORDER — HEPARIN SOD (PORK) LOCK FLUSH 100 UNIT/ML IV SOLN
500.0000 [IU] | Freq: Once | INTRAVENOUS | Status: AC | PRN
  Administered 2021-04-20: 500 [IU]

## 2021-04-20 MED ORDER — SODIUM CHLORIDE 0.9 % IV SOLN
500.0000 mg/m2 | Freq: Once | INTRAVENOUS | Status: AC
  Administered 2021-04-20: 1100 mg via INTRAVENOUS
  Filled 2021-04-20: qty 40

## 2021-04-20 NOTE — Progress Notes (Signed)
[facility] Telephone:([phone]) [phone]   Fax:([phone]  OFFICE PROGRESS NOTE  Koirala, Dibas, MD [address] [place] [address]  DIAGNOSIS: Stage IV (T2a, N2, M1b) non-small cell lung cancer, adenocarcinoma diagnosed in May 2022 and presented with left lower lobe lung mass in addition to left hilar and mediastinal lymphadenopathy as well as bilateral pulmonary nodules and metastatic disease to the ribs bilaterally. 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  PRIOR THERAPY: None  CURRENT THERAPY: Systemic chemotherapy with carboplatin for AUC of 5, Alimta 500 Mg/M2 and Keytruda 200 Mg IV every 3 weeks.  Started at [facility] on 10/13/2020. Status post 9 cycles.  Starting from cycle #5, the patient will be on maintenance treatment with Alimta and Keytruda every 3 weeks.  INTERVAL HISTORY: Tiffany Owen 61 y.o. female returns to the clinic today for follow-up visit accompanied by her daughter.  The patient is feeling fine today with no concerning complaints except for darkening of the skin of her legs with mild swelling.  She denied having any chest pain, shortness of breath, cough or hemoptysis.  She denied having any fever or chills.  She has no nausea, vomiting, diarrhea or constipation.  She has no headache or visual changes.  She is here today for evaluation before starting cycle #10 of her treatment.    MEDICAL HISTORY: Past Medical History:  Diagnosis Date   Cancer [facility])    CHF (congestive heart failure) (HCC)    COPD (chronic obstructive pulmonary disease) ([place])    Family history of lung cancer 10/29/2020   Hypertension    Pneumonia 04/17/2018    ALLERGIES:  has No Known Allergies.  MEDICATIONS:  Current Outpatient Medications  Medication Sig Dispense Refill   acetaminophen (TYLENOL) 500 MG tablet Take 2 tablets (1,000 mg total) by mouth every 8 (eight) hours. 30  tablet 0   budesonide (PULMICORT) 0.5 MG/2ML nebulizer solution Take 0.5 mg by nebulization 2 (two) times daily as needed (Asthma).     carvedilol (COREG) 12.5 MG tablet Take 12.5 mg by mouth 2 (two) times daily with a meal.     Cholecalciferol (VITAMIN D3 PO) Take 1 capsule by mouth daily.     Cyanocobalamin (VITAMIN B-12 PO) Take 1 tablet by mouth daily.     folic acid (FOLVITE) 1 MG tablet Take 1 tablet (1 mg total) by mouth See admin instructions. Take 1 mg daily by mouth 30 tablet 2   hydrochlorothiazide (HYDRODIURIL) 25 MG tablet Take 25 mg by mouth daily.     ipratropium-albuterol (DUONEB) 0.5-2.5 (3) MG/3ML SOLN Take 3 mLs by nebulization every 6 (six) hours as needed. (Patient taking differently: Take 3 mLs by nebulization every 6 (six) hours as needed (Asthma).) 360 mL 1   lidocaine (LIDODERM) 5 % Place 1 patch onto the skin daily. Remove & Discard patch within 12 hours or as directed by MD 30 patch 0   lidocaine-prilocaine (EMLA) cream Apply 1 application topically as needed (for port access). 30 g 0   LORazepam (ATIVAN) 0.5 MG tablet One tab po 30 min before MRI. Repeat once if needed. 2 tablet 0   metFORMIN (GLUCOPHAGE-XR) 500 MG 24 hr tablet Take 500 mg by mouth daily with breakfast.     Multiple Vitamins-Minerals (ONE-A-DAY WOMENS PO) Take 1 tablet by mouth daily with breakfast.     oxyCODONE (OXY IR/ROXICODONE) 5 MG immediate  release tablet Take 1 tablet (5 mg total) by mouth every 8 (eight) hours as needed for moderate pain. 20 tablet 0   OXYGEN Inhale 3 L/min into the lungs continuous.     prochlorperazine (COMPAZINE) 10 MG tablet Take 10 mg by mouth every 6 (six) hours as needed for nausea or vomiting.     Tiotropium Bromide-Olodaterol 2.5-2.5 MCG/ACT AERS Inhale 2 puffs into the lungs daily. 4 g 11   No current facility-administered medications for this visit.    SURGICAL HISTORY:  Past Surgical History:  Procedure Laterality Date   CESAREAN SECTION  1979; 1981; 1992   IR  IMAGING GUIDED PORT INSERTION  11/12/2020   TUBAL LIGATION  1992    REVIEW OF SYSTEMS:  A comprehensive review of systems was negative except for: Constitutional: positive for fatigue Respiratory: positive for dyspnea on exertion Integument/breast: positive for skin color change   PHYSICAL EXAMINATION: General appearance: alert, cooperative, fatigued, and no distress Head: Normocephalic, without obvious abnormality, atraumatic Neck: no adenopathy, no JVD, supple, symmetrical, trachea midline, and thyroid not enlarged, symmetric, no tenderness/mass/nodules Lymph nodes: Cervical, supraclavicular, and axillary nodes normal. Resp: clear to auscultation bilaterally Back: symmetric, no curvature. ROM normal. No CVA tenderness. Cardio: regular rate and rhythm, S1, S2 normal, no murmur, click, rub or gallop GI: soft, non-tender; bowel sounds normal; no masses,  no organomegaly Extremities: extremities normal, atraumatic, no cyanosis or edema  ECOG PERFORMANCE STATUS: 1 - Symptomatic but completely ambulatory  Blood pressure (!) 158/91, pulse 88, temperature (!) 96.7 F (35.9 C), temperature source Tympanic, resp. rate 20, height 4' 9" (1.448 m), weight 267 lb 8 oz (121.3 kg), SpO2 93 %.  LABORATORY DATA: Lab Results  Component Value Date   WBC 5.7 04/20/2021   HGB 12.1 04/20/2021   HCT 38.6 04/20/2021   MCV 104.6 (H) 04/20/2021   PLT 399 04/20/2021      Chemistry      Component Value Date/Time   NA 142 04/20/2021 0819   K 4.0 04/20/2021 0819   CL 104 04/20/2021 0819   CO2 31 04/20/2021 0819   BUN 14 04/20/2021 0819   CREATININE 0.84 04/20/2021 0819      Component Value Date/Time   CALCIUM 9.4 04/20/2021 0819   ALKPHOS 99 04/20/2021 0819   AST 32 04/20/2021 0819   ALT 27 04/20/2021 0819   BILITOT 0.4 04/20/2021 0819       RADIOGRAPHIC STUDIES: No results found.  ASSESSMENT AND PLAN: This is a very pleasant 61 years old African-American female diagnosed with Stage IV  (T2a, N2, M1b) non-small cell lung cancer, adenocarcinoma diagnosed in May 2022 and presented with left lower lobe lung mass in addition to left hilar and mediastinal lymphadenopathy as well as bilateral pulmonary nodules and metastatic disease to the ribs bilaterally. 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 The patient is currently undergoing systemic chemotherapy with carboplatin for AUC of 5, Alimta 500 Mg/M2 and Keytruda 200 Mg IV every 3 weeks status post 9 cycle.  First cycle of her treatment was started at [facility] in [place].  Starting from cycle #5 the patient will be on maintenance treatment with Alimta and Keytruda every 3 weeks. The patient continues to tolerate her treatment well with no concerning adverse effects. I recommended for her to proceed with cycle #10 today as planned. I will see her back for follow-up visit in 3 weeks for evaluation with  repeat CT scan of the chest, abdomen and pelvis for restaging of her disease. The darkening of her skin is likely secondary to her history of diabetes mellitus as well as chemotherapy side effects. The patient was advised to call immediately if she has any other concerning symptoms in the interval. The patient voices understanding of current disease status and treatment options and is in agreement with the current care plan.  All questions were answered. The patient knows to call the clinic with any problems, questions or concerns. We can certainly see the patient much sooner if necessary.   Disclaimer: This note was dictated with voice recognition software. Similar sounding words can inadvertently be transcribed and may not be corrected upon review.

## 2021-04-20 NOTE — Patient Instructions (Signed)
[facility]  Discharge Instructions: Thank you for choosing [facility] to provide your oncology and hematology care.   If you have a lab appointment with the Cancer Center, please go directly to the Cancer Center and check in at the registration area.   Wear comfortable clothing and clothing appropriate for easy access to any Portacath or PICC line.   We strive to give you quality time with your provider. You may need to reschedule your appointment if you arrive late (15 or more minutes).  Arriving late affects you and other patients whose appointments are after yours.  Also, if you miss three or more appointments without notifying the office, you may be dismissed from the clinic at the provider's discretion.      For prescription refill requests, have your pharmacy contact our office and allow 72 hours for refills to be completed.    Today you received the following chemotherapy and/or immunotherapy agents keytruda, alimta      To help prevent nausea and vomiting after your treatment, we encourage you to take your nausea medication as directed.  BELOW ARE SYMPTOMS THAT SHOULD BE REPORTED IMMEDIATELY: *FEVER GREATER THAN 100.4 F (38 C) OR HIGHER *CHILLS OR SWEATING *NAUSEA AND VOMITING THAT IS NOT CONTROLLED WITH YOUR NAUSEA MEDICATION *UNUSUAL SHORTNESS OF BREATH *UNUSUAL BRUISING OR BLEEDING *URINARY PROBLEMS (pain or burning when urinating, or frequent urination) *BOWEL PROBLEMS (unusual diarrhea, constipation, pain near the anus) TENDERNESS IN MOUTH AND THROAT WITH OR WITHOUT PRESENCE OF ULCERS (sore throat, sores in mouth, or a toothache) UNUSUAL RASH, SWELLING OR PAIN  UNUSUAL VAGINAL DISCHARGE OR ITCHING   Items with * indicate a potential emergency and should be followed up as soon as possible or go to the Emergency Department if any problems should occur.  Please show the CHEMOTHERAPY ALERT CARD or IMMUNOTHERAPY ALERT CARD at  check-in to the Emergency Department and triage nurse.  Should you have questions after your visit or need to cancel or reschedule your appointment, please contact [facility]  Dept: [phone]  and follow the prompts.  Office hours are 8:00 a.m. to 4:30 p.m. Monday - Friday. Please note that voicemails left after 4:00 p.m. may not be returned until the following business day.  We are closed weekends and major holidays. You have access to a nurse at all times for urgent questions. Please call the main number to the clinic Dept: [phone] and follow the prompts.   For any non-urgent questions, you may also contact your provider using MyChart. We now offer e-Visits for anyone 18 and older to request care online for non-urgent symptoms. For details visit [email or web address].   Also download the MyChart app! Go to the app store, search "MyChart", open the app, select [facility], and log in with your MyChart username and password.  Due to Covid, a mask is required upon entering the hospital/clinic. If you do not have a mask, one will be given to you upon arrival. For doctor visits, patients may have 1 support person aged 18 or older with them. For treatment visits, patients cannot have anyone with them due to current Covid guidelines and our immunocompromised population.   

## 2021-04-20 NOTE — Patient Instructions (Signed)

## 2021-04-21 DIAGNOSIS — M7989 Other specified soft tissue disorders: Secondary | ICD-10-CM | POA: Diagnosis not present

## 2021-04-21 DIAGNOSIS — R310 Gross hematuria: Secondary | ICD-10-CM | POA: Diagnosis not present

## 2021-04-23 DIAGNOSIS — J449 Chronic obstructive pulmonary disease, unspecified: Secondary | ICD-10-CM | POA: Diagnosis not present

## 2021-04-23 DIAGNOSIS — J9610 Chronic respiratory failure, unspecified whether with hypoxia or hypercapnia: Secondary | ICD-10-CM | POA: Diagnosis not present

## 2021-04-26 DIAGNOSIS — I5033 Acute on chronic diastolic (congestive) heart failure: Secondary | ICD-10-CM | POA: Diagnosis not present

## 2021-04-28 DIAGNOSIS — C3490 Malignant neoplasm of unspecified part of unspecified bronchus or lung: Secondary | ICD-10-CM | POA: Diagnosis not present

## 2021-04-28 DIAGNOSIS — J9611 Chronic respiratory failure with hypoxia: Secondary | ICD-10-CM | POA: Diagnosis not present

## 2021-04-28 DIAGNOSIS — J449 Chronic obstructive pulmonary disease, unspecified: Secondary | ICD-10-CM | POA: Diagnosis not present

## 2021-04-28 DIAGNOSIS — G4733 Obstructive sleep apnea (adult) (pediatric): Secondary | ICD-10-CM | POA: Diagnosis not present

## 2021-05-03 ENCOUNTER — Telehealth: Payer: Self-pay | Admitting: Pulmonary Disease

## 2021-05-03 NOTE — Telephone Encounter (Signed)
Called and spoke with pt's daughter [name] expressing condolences on the loss of pt. [name] stated that pt passed away 05-11-2022. Stated to her that we would make Dr. Elsworth Soho aware and she verbalized understanding. Routing to Dr. Elsworth Soho as an Juluis Rainier.

## 2021-05-05 ENCOUNTER — Encounter: Payer: Self-pay | Admitting: *Deleted

## 2021-05-05 NOTE — Progress Notes (Signed)
Oncology Nurse Navigator Documentation  Oncology Nurse Navigator Flowsheets 05/05/2021 03/29/2021 03/08/2021 12/04/2020  Abnormal Finding Date - - - 07/24/2020  Confirmed Diagnosis Date - - - 09/02/2020  Diagnosis Status - - - Confirmed Diagnosis Complete  Planned Course of Treatment - - - Chemotherapy;Targeted Therapy  Phase of Treatment - - - Targeted Therapy  Chemotherapy Actual Start Date: - - - 11/23/2020  Targeted Therapy Actual Start Date: - - - 11/23/2020  Navigator Follow Up Date: - - 03/29/2021 12/14/2020  Navigator Follow Up Reason: - - Follow-up Appointment Follow-up Appointment  Navigation Complete Date: - 03/29/2021 - -  Post Navigation: Continue to Follow Patient? - No - -  Reason Not Navigating Patient: - Patient On Maintenance Chemotherapy - -  Navigator [facility]  Navigator Encounter Type - Follow-up Appt Follow-up Appt Other:  Treatment Initiated Date - - - 11/23/2020  Patient Visit Type - MedOnc;Follow-up Follow-up;MedOnc Other  Treatment Phase - Treatment Treatment Treatment  Barriers/Navigation Needs - - - Coordination of Care  Interventions Other/I followed up on Tiffany Owen and how she was doing through tx. I learned she has passed away. I will update the team.  Psycho-Social Support Psycho-Social Support Coordination of Care  Acuity - Level 1-No Barriers Level 1-No Barriers Level 2-Minimal Needs (1-2 Barriers Identified)  Coordination of Care - - - Other  Time Spent with Patient 15 15 30  30

## 2021-05-10 ENCOUNTER — Other Ambulatory Visit: Payer: BC Managed Care – PPO

## 2021-05-10 ENCOUNTER — Ambulatory Visit: Payer: BC Managed Care – PPO | Admitting: Internal Medicine

## 2021-05-10 ENCOUNTER — Ambulatory Visit: Payer: BC Managed Care – PPO

## 2021-05-19 DEATH — deceased

## 2021-05-27 DIAGNOSIS — I5033 Acute on chronic diastolic (congestive) heart failure: Secondary | ICD-10-CM | POA: Diagnosis not present

## 2021-05-31 ENCOUNTER — Ambulatory Visit: Payer: BC Managed Care – PPO

## 2021-05-31 ENCOUNTER — Ambulatory Visit: Payer: BC Managed Care – PPO | Admitting: Physician Assistant

## 2021-05-31 ENCOUNTER — Other Ambulatory Visit: Payer: BC Managed Care – PPO

## 2021-06-21 ENCOUNTER — Other Ambulatory Visit: Payer: BC Managed Care – PPO

## 2021-06-21 ENCOUNTER — Ambulatory Visit: Payer: BC Managed Care – PPO | Admitting: Internal Medicine

## 2021-06-21 ENCOUNTER — Ambulatory Visit: Payer: BC Managed Care – PPO
